# Patient Record
Sex: Female | Born: 1975 | Race: Black or African American | Hispanic: No | Marital: Married | Smoking: Never smoker
Health system: Southern US, Community
[De-identification: ages and names within clinical notes are randomized; demographics above are authoritative.]

## PROBLEM LIST (undated history)

## (undated) ENCOUNTER — Ambulatory Visit (HOSPITAL_COMMUNITY): Admission: EM | Payer: Commercial Managed Care - HMO | Source: Home / Self Care

## (undated) DIAGNOSIS — K760 Fatty (change of) liver, not elsewhere classified: Secondary | ICD-10-CM

## (undated) DIAGNOSIS — R7303 Prediabetes: Secondary | ICD-10-CM

## (undated) DIAGNOSIS — I10 Essential (primary) hypertension: Secondary | ICD-10-CM

## (undated) DIAGNOSIS — K589 Irritable bowel syndrome without diarrhea: Secondary | ICD-10-CM

## (undated) HISTORY — DX: Prediabetes: R73.03

## (undated) HISTORY — PX: CIRCUMCISION: SUR203

## (undated) HISTORY — DX: Essential (primary) hypertension: I10

## (undated) HISTORY — PX: HEMORRHOID SURGERY: SHX153

## (undated) HISTORY — DX: Fatty (change of) liver, not elsewhere classified: K76.0

---

## 2004-09-18 HISTORY — PX: DILATION AND CURETTAGE OF UTERUS: SHX78

## 2013-09-18 HISTORY — PX: HEMORRHOID SURGERY: SHX153

## 2014-08-28 ENCOUNTER — Ambulatory Visit: Payer: Self-pay | Attending: Internal Medicine

## 2014-09-17 ENCOUNTER — Emergency Department (HOSPITAL_COMMUNITY)
Admission: EM | Admit: 2014-09-17 | Discharge: 2014-09-17 | Disposition: A | Discharge status: Home / Self Care | Payer: Self-pay | Attending: Emergency Medicine | Admitting: Emergency Medicine

## 2014-09-17 ENCOUNTER — Encounter (HOSPITAL_COMMUNITY): Payer: Self-pay | Admitting: Family Medicine

## 2014-09-17 DIAGNOSIS — N939 Abnormal uterine and vaginal bleeding, unspecified: Secondary | ICD-10-CM | POA: Insufficient documentation

## 2014-09-17 DIAGNOSIS — Z3202 Encounter for pregnancy test, result negative: Secondary | ICD-10-CM | POA: Insufficient documentation

## 2014-09-17 LAB — POC URINE PREG, ED: PREG TEST UR: NEGATIVE

## 2014-09-17 NOTE — Discharge Instructions (Signed)
Abnormal Uterine Bleeding Abnormal uterine bleeding can affect women at various stages in life, including teenagers, women in their reproductive years, pregnant women, and women who have reached menopause. Several kinds of uterine bleeding are considered abnormal, including:  Bleeding or spotting between periods.   Bleeding after sexual intercourse.   Bleeding that is heavier or more than normal.   Periods that last longer than usual.  Bleeding after menopause.  Many cases of abnormal uterine bleeding are minor and simple to treat, while others are more serious. Any type of abnormal bleeding should be evaluated by your health care provider. Treatment will depend on the cause of the bleeding. HOME CARE INSTRUCTIONS Monitor your condition for any changes. The following actions may help to alleviate any discomfort you are experiencing:  Avoid the use of tampons and douches as directed by your health care provider.  Change your pads frequently. You should get regular pelvic exams and Pap tests. Keep all follow-up appointments for diagnostic tests as directed by your health care provider.  SEEK MEDICAL CARE IF:   Your bleeding lasts more than 1 week.   You feel dizzy at times.  SEEK IMMEDIATE MEDICAL CARE IF:   You pass out.   You are changing pads every 15 to 30 minutes.   You have abdominal pain.  You have a fever.   You become sweaty or weak.   You are passing large blood clots from the vagina.   You start to feel nauseous and vomit. MAKE SURE YOU:   Understand these instructions.  Will watch your condition.  Will get help right away if you are not doing well or get worse. Document Released: 09/04/2005 Document Revised: 09/09/2013 Document Reviewed: 04/03/2013 ExitCare Patient Information 2015 [employer]. This information is not intended to replace advice given to you by your health care provider. Make sure you discuss any questions you have with your  health care provider.  

## 2014-09-17 NOTE — ED Provider Notes (Signed)
CSN: [id number]     Arrival date & time 09/17/14  1101 History   First MD Initiated Contact with Patient 09/17/14 1136     Chief Complaint  Patient presents with  . Menstrual Problem     The history is provided by the patient.   Arabic interpreter used  Patient presents to emergency department complaining of irregular menstrual cycles for several years.  Her last menstrual cycle was in May.  She was under the care of a specialist in [place].  She is to be on a medication which she cannot remember the name.  She has [place] seen a gynecologist [place].  She presents complaining of abnormal menstrual cycles.  No active bleeding now.  She denies abdominal pain.  Denies nausea vomiting.  No fevers or chills.  No vaginal complaints.  History reviewed. No pertinent past medical history. No past surgical history on file. History reviewed. No pertinent family history. History  Substance Use Topics  . Smoking status: Never Smoker   . Smokeless tobacco: Not on file  . Alcohol Use: No   OB History    No data available     Review of Systems  All other systems reviewed and are negative.     Allergies  Review of patient's allergies indicates no known allergies.  Home Medications   Prior to Admission medications   Not on File   BP 134/83 mmHg  Pulse 100  Temp(Src) 98.6 F (37 C) (Oral)  Resp 17  SpO2 100% Physical Exam  Constitutional: She is oriented to person, place, and time. She appears well-developed and well-nourished. No distress.  HENT:  Head: Normocephalic and atraumatic.  Eyes: EOM are normal.  Neck: Normal range of motion.  Cardiovascular: Normal rate, regular rhythm and normal heart sounds.   Pulmonary/Chest: Effort normal and breath sounds normal.  Abdominal: Soft. She exhibits no distension. There is no tenderness. There is no rebound and no guarding.  Musculoskeletal: Normal range of motion.  Neurological: She is alert and oriented to person, place,  and time.  Skin: Skin is warm and dry.  Psychiatric: She has a normal mood and affect. Judgment normal.  Nursing note and vitals reviewed.   ED Course  Procedures (including critical care time) Labs Review Labs Reviewed  POC URINE PREG, ED    Imaging Review No results found.   EKG Interpretation None      MDM   Final diagnoses:  None    No indication for additional workup in emergency department.  Urine pregnancy test is negative.  She will need outpatient testing by gynecologist.  Referral to women's outpatient clinic.    Lyanne CoKevin M Jerman Tinnon, MD 09/17/14 [phone]

## 2014-09-17 NOTE — ED Notes (Signed)
Pt reports irregular period.  Pt takes "diveston" and states "if i dont take it, my period wont come."  Pt reports last period in may.  Pt reports pain in rlq and llq.

## 2014-09-17 NOTE — ED Notes (Signed)
Pt made aware to return if symptoms worsen or if any life threatening symptoms occur.   

## 2014-09-17 NOTE — ED Notes (Signed)
Pt here for irregular menstrual cycle. Ss since she was [age]. sts LMP last May. sts she takes meds for this but doesn't have

## 2014-10-01 ENCOUNTER — Encounter: Payer: Self-pay | Admitting: Family Medicine

## 2014-10-01 ENCOUNTER — Ambulatory Visit: Payer: Self-pay | Attending: Family Medicine | Admitting: Family Medicine

## 2014-10-01 VITALS — BP 105/70 | HR 103 | Temp 98.9°F | Resp 16 | Ht 61.0 in | Wt 134.6 lb

## 2014-10-01 DIAGNOSIS — Z113 Encounter for screening for infections with a predominantly sexual mode of transmission: Secondary | ICD-10-CM | POA: Insufficient documentation

## 2014-10-01 DIAGNOSIS — H0014 Chalazion left upper eyelid: Secondary | ICD-10-CM | POA: Insufficient documentation

## 2014-10-01 DIAGNOSIS — R51 Headache: Secondary | ICD-10-CM | POA: Insufficient documentation

## 2014-10-01 DIAGNOSIS — Z124 Encounter for screening for malignant neoplasm of cervix: Secondary | ICD-10-CM | POA: Insufficient documentation

## 2014-10-01 DIAGNOSIS — Z0000 Encounter for general adult medical examination without abnormal findings: Secondary | ICD-10-CM

## 2014-10-01 DIAGNOSIS — N926 Irregular menstruation, unspecified: Secondary | ICD-10-CM | POA: Insufficient documentation

## 2014-10-01 DIAGNOSIS — J309 Allergic rhinitis, unspecified: Secondary | ICD-10-CM | POA: Insufficient documentation

## 2014-10-01 DIAGNOSIS — Z23 Encounter for immunization: Secondary | ICD-10-CM

## 2014-10-01 DIAGNOSIS — N90810 Female genital mutilation status, unspecified: Secondary | ICD-10-CM

## 2014-10-01 LAB — POCT URINE PREGNANCY: PREG TEST UR: NEGATIVE

## 2014-10-01 LAB — COMPLETE METABOLIC PANEL WITH GFR
ALBUMIN: 3.5 g/dL (ref 3.5–5.2)
ALT: 13 U/L (ref 0–35)
AST: 16 U/L (ref 0–37)
Alkaline Phosphatase: 115 U/L (ref 39–117)
BUN: 11 mg/dL (ref 6–23)
CALCIUM: 9.5 mg/dL (ref 8.4–10.5)
CO2: 27 mEq/L (ref 19–32)
Chloride: 98 mEq/L (ref 96–112)
Creat: 0.56 mg/dL (ref 0.50–1.10)
GFR, Est Non African American: >89 mL/min
GLUCOSE: 108 mg/dL — AB (ref 70–99)
Potassium: 3.8 mEq/L (ref 3.5–5.3)
SODIUM: 135 meq/L (ref 135–145)
TOTAL PROTEIN: 7.1 g/dL (ref 6.0–8.3)
Total Bilirubin: 0.2 mg/dL (ref 0.2–1.2)

## 2014-10-01 LAB — POCT URINALYSIS DIPSTICK
BILIRUBIN UA: NEGATIVE
GLUCOSE UA: NEGATIVE
Ketones, UA: NEGATIVE
Leukocytes, UA: NEGATIVE
Nitrite, UA: NEGATIVE
PH UA: 5.5
Protein, UA: NEGATIVE
Spec Grav, UA: 1.025
UROBILINOGEN UA: 0.2

## 2014-10-01 LAB — TSH: TSH: 0.701 u[IU]/mL (ref 0.350–4.500)

## 2014-10-01 LAB — CBC
HCT: 38.8 % (ref 36.0–46.0)
Hemoglobin: 12 g/dL (ref 12.0–15.0)
MCH: 23.1 pg — ABNORMAL LOW (ref 26.0–34.0)
MCHC: 30.9 g/dL (ref 30.0–36.0)
MCV: 74.8 fL — ABNORMAL LOW (ref 78.0–100.0)
MPV: 9.8 fL (ref 8.6–12.4)
Platelets: 395 10*3/uL (ref 150–400)
RBC: 5.19 MIL/uL — AB (ref 3.87–5.11)
RDW: 15.7 % — ABNORMAL HIGH (ref 11.5–15.5)
WBC: 11 10*3/uL — ABNORMAL HIGH (ref 4.0–10.5)

## 2014-10-01 MED ORDER — LORATADINE 10 MG PO TABS: 10.0000 mg | ORAL_TABLET | Freq: Every day | ORAL | Status: DC

## 2014-10-01 MED ORDER — FLUTICASONE PROPIONATE 50 MCG/ACT NA SUSP: 2.0000 | Freq: Every day | NASAL | Status: DC

## 2014-10-01 NOTE — Progress Notes (Signed)
Here to establish care Has lower back pain been going on for years 10/10 Has pain in ears and throat itching in eyes and nose Pap not had ever Flu shot given

## 2014-10-01 NOTE — Patient Instructions (Addendum)
[name],  Thank you for coming in today. It was a pleasure meeting you. I look forward to being your primary doctor.   1. Irregular menses: labs done today. Normal pelvic exam. Plan for ultrasound after labs reviewed.   2. Allergies: claritin once daily. Flonase.  3. Chalazion on L upper eye lid A chalazion is a swelling or hard lump on the eyelid caused by a blocked oil gland. Chalazions may occur on the upper or the lower eyelid.  CAUSES  Oil gland in the eyelid becomes blocked. SYMPTOMS   Swelling or hard lump on the eyelid. This lump may make it hard to see out of the eye.  The swelling may spread to areas around the eye. TREATMENT   Although some chalazions disappear by themselves in 1 or 2 months, some chalazions may need to be removed.  Medicines to treat an infection may be required. HOME CARE INSTRUCTIONS   Wash your hands often and dry them with a clean towel. Do not touch the chalazion.  Apply heat to the eyelid several times a day for 10 minutes to help ease discomfort and bring any yellowish white fluid (pus) to the surface. One way to apply heat to a chalazion is to use the handle of a metal spoon.  Hold the handle under hot water until it is hot, and then wrap the handle in paper towels so that the heat can come through without burning your skin.  Hold the wrapped handle against the chalazion and reheat the spoon handle as needed.  Apply heat in this fashion for 10 minutes, 4 times per day.  Return to your caregiver to have the pus removed if it does not break (rupture) on its own.  Do not try to remove the pus yourself by squeezing the chalazion or sticking it with a pin or needle.  Only take over-the-counter or prescription medicines for pain, discomfort, or fever as directed by your caregiver. SEEK IMMEDIATE MEDICAL CARE IF:   You have pain in your eye.  Your vision changes.  The chalazion does not go away.  The chalazion becomes painful, red,  or swollen, grows larger, or does not start to disappear after 2 weeks. MAKE SURE YOU:   Understand these instructions.  Will watch your condition.  Will get help right away if you are not doing well or get worse. Document Released: 09/01/2000 Document Revised: 11/27/2011 Document Reviewed: 12/20/2009 [facility] Patient Information 2015 [place], [place]. This information is not intended to replace advice given to you by your health care provider. Make sure you discuss any questions you have with your health care provider.   F/u in 6 weeks for irregular menses and allergies   Dr. Armen PickupFunches

## 2014-10-01 NOTE — Progress Notes (Signed)
   Subjective:    Patient ID: Candice [name], female    DOB: 11-Mar-1976, 39 y.o.   MRN: [id number] CC: establish care, itchy eyes and nose, pain in head and throat, lower back pain, irregular periods, peeling skin around her fingernails   HPI 39 yo F establish care: Speaks Arabic, interpreter present:   1. Itchy eyes and nose: x one year. No medication. Tearing, sneezing and coughing, ear pressure. No known allergies. Frontal headache. No maxillary pain. No fever.    2. Irregular periods: menarche age 39. Irregular since middle school (age 912-13).  Has gone a whole year without seeing her period. She got married at age 39 and was unable to conceive. She had a D&C of her uterus in [place] in hopes of getting pregnant. Still did not conceived. She got divorced after one year of marriage. She was taking some medication for 3 months that induced menses, she stopped the medication in May 2015. LMP: May 2015.   Soc Hx: non smoker  Med Hx: irregular menses since age 39-13  Surg Hx: hemorrhoid surgery Review of Systems As per HPI     Objective:   Physical Exam BP 105/70 mmHg  Pulse 103  Temp(Src) 98.9 F (37.2 C)  Resp 16  Ht 5\' 1"  (1.549 m)  Wt 134 lb 9.6 oz (61.054 kg)  BMI 25.45 kg/m2  SpO2 100%  LMP 01/16/2014 (Approximate) General appearance: alert, cooperative and no distress Eyes: negative findings: conjunctivae and sclerae normal, corneas clear and pupils equal, round, reactive to light and accomodation, positive findings: eyelids/periorbital: chalazion on the left upper lid, non tender  Nose: swollen nasal turbinates  Neck: no adenopathy, supple, symmetrical, trachea midline and thyroid not enlarged, symmetric, no tenderness/mass/nodules Lungs: clear to auscultation bilaterally Heart: regular rate and rhythm, S1, S2 normal, no murmur, click, rub or gallop Abdomen: soft, non-tender; bowel sounds normal; no masses,  no organomegaly Pelvic: external genitalia reveals a well healed  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 Speculum exam done with small plastic speculum reveals normal vagina and cervix. Bimanual exam is negative for uterine or adnexal mass or tenderness.  Extremities: extremities normal, atraumatic, no cyanosis or edema       Assessment & Plan:

## 2014-10-02 ENCOUNTER — Telehealth: Payer: Self-pay | Admitting: *Deleted

## 2014-10-02 LAB — CERVICOVAGINAL ANCILLARY ONLY
Chlamydia: NEGATIVE
Neisseria Gonorrhea: NEGATIVE
WET PREP (BD AFFIRM): NEGATIVE
Wet Prep (BD Affirm): NEGATIVE
Wet Prep (BD Affirm): NEGATIVE

## 2014-10-02 LAB — CYTOLOGY - PAP

## 2014-10-02 LAB — HIV ANTIBODY (ROUTINE TESTING W REFLEX): HIV 1&2 Ab, 4th Generation: NONREACTIVE

## 2014-10-02 LAB — FSH/LH
FSH: 30 m[IU]/mL
LH: 29.9 m[IU]/mL

## 2014-10-02 NOTE — Assessment & Plan Note (Signed)
A: based on exam and history P: claritin once daily. Flonase.

## 2014-10-02 NOTE — Assessment & Plan Note (Signed)
Screening HIV negative  

## 2014-10-02 NOTE — Telephone Encounter (Signed)
-----   Message from Lora PaulaJosalyn C Funches, MD sent at 10/02/2014  8:21 AM EST ----- Normal labs with midcycle range hormone levels, normal TSH. HIV negative Normal CMP Normal WBC and Hgb, noted low MCV, ? Iron deficiency w/o anemia plan for f/u iron studies

## 2014-10-02 NOTE — Assessment & Plan Note (Signed)
A: U preg negative. Normal exam except for female circumcision as noted in chart.  P: Labs, reviewed and normal TSH, midcycle range FSH and LH, normal CMP and CBC.  F/u pelvic and transvaginal ultrasound ordered

## 2014-10-02 NOTE — Telephone Encounter (Signed)
Used [employer] Interpreted line Arabic # [id number]  Pt aware of lab results

## 2014-10-02 NOTE — Assessment & Plan Note (Signed)
Pap done

## 2014-10-02 NOTE — Assessment & Plan Note (Signed)
Chalazion on L upper eye lid. Recommend warm compresses, 4 x daily.

## 2014-10-02 NOTE — Telephone Encounter (Signed)
-----   Message from Lora PaulaJosalyn C Funches, MD sent at 10/02/2014  8:31 AM EST ----- Negative wet prep

## 2014-10-05 ENCOUNTER — Telehealth: Payer: Self-pay | Admitting: *Deleted

## 2014-10-05 NOTE — Telephone Encounter (Signed)
Used [employer] Interpreter Arabic [phone] Pt aware of results

## 2014-10-05 NOTE — Telephone Encounter (Signed)
-----   Message from Lora PaulaJosalyn C Funches, MD sent at 10/05/2014  1:46 PM EST ----- Negative GC/chlam Normal pap repeat in 3 years

## 2014-10-22 ENCOUNTER — Ambulatory Visit (INDEPENDENT_AMBULATORY_CARE_PROVIDER_SITE_OTHER): Payer: Self-pay | Admitting: Obstetrics and Gynecology

## 2014-10-22 VITALS — BP 120/57 | HR 104 | Wt 216.7 lb

## 2014-10-22 DIAGNOSIS — N911 Secondary amenorrhea: Secondary | ICD-10-CM

## 2014-10-22 MED ORDER — NORGESTIMATE-ETH ESTRADIOL 0.25-35 MG-MCG PO TABS: 1.0000 | ORAL_TABLET | Freq: Every day | ORAL | Status: DC

## 2014-10-22 NOTE — Progress Notes (Signed)
Patient ID: Candice SiasHala [name], female   DOB: [birth year], 39 y.o.   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 She reports a long standing history of irregular periods. She states that menarches occurred at the age of 39 and her irregular cycles started around the age of 39. The longest she has been amenorrheic was for 1 year. She had a D&C done in [place] in the past without improvement in her cycles. She was started on a medication similar to provera which induced cycles for 3 months but has since stopped taking it. Her last period was in 01/2014. [facility]  No past medical history on file. Past Surgical History  Procedure Laterality Date  . Dilation and curettage of uterus  2006  . Hemorrhoid surgery  2015  . 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 Hypertension Father    History  Substance Use Topics  . Smoking status: Never Smoker   . Smokeless tobacco: Not on file  . Alcohol Use: No   GENERAL: Well-developed, well-nourished female in no acute distress. Obese ABDOMEN: Soft, nontender, nondistended. No organomegaly. PELVIC: Normal external female genitalia. Vagina is pink and rugated.  Normal discharge. Normal appearing cervix. Uterus is normal in size. No adnexal mass or tenderness. EXTREMITIES: No cyanosis, clubbing, or edema, 2+ distal pulses.  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

## 2014-10-23 LAB — PROLACTIN: PROLACTIN: 3.8 ng/mL

## 2014-10-29 ENCOUNTER — Ambulatory Visit: Payer: Self-pay | Admitting: Family Medicine

## 2014-12-04 ENCOUNTER — Emergency Department (HOSPITAL_COMMUNITY): Payer: Self-pay

## 2014-12-04 ENCOUNTER — Encounter (HOSPITAL_COMMUNITY): Payer: Self-pay | Admitting: *Deleted

## 2014-12-04 ENCOUNTER — Emergency Department (HOSPITAL_COMMUNITY)
Admission: EM | Admit: 2014-12-04 | Discharge: 2014-12-04 | Disposition: A | Discharge status: Home / Self Care | Payer: Self-pay | Attending: Emergency Medicine | Admitting: Emergency Medicine

## 2014-12-04 DIAGNOSIS — Y998 Other external cause status: Secondary | ICD-10-CM | POA: Insufficient documentation

## 2014-12-04 DIAGNOSIS — Y9389 Activity, other specified: Secondary | ICD-10-CM | POA: Insufficient documentation

## 2014-12-04 DIAGNOSIS — Y9289 Other specified places as the place of occurrence of the external cause: Secondary | ICD-10-CM | POA: Insufficient documentation

## 2014-12-04 DIAGNOSIS — W19XXXA Unspecified fall, initial encounter: Secondary | ICD-10-CM

## 2014-12-04 DIAGNOSIS — Z7952 Long term (current) use of systemic steroids: Secondary | ICD-10-CM | POA: Insufficient documentation

## 2014-12-04 DIAGNOSIS — W1839XA Other fall on same level, initial encounter: Secondary | ICD-10-CM | POA: Insufficient documentation

## 2014-12-04 DIAGNOSIS — S39012A Strain of muscle, fascia and tendon of lower back, initial encounter: Secondary | ICD-10-CM | POA: Insufficient documentation

## 2014-12-04 DIAGNOSIS — Z79899 Other long term (current) drug therapy: Secondary | ICD-10-CM | POA: Insufficient documentation

## 2014-12-04 MED ORDER — IBUPROFEN 400 MG PO TABS
800.0000 mg | ORAL_TABLET | Freq: Once | ORAL | Status: AC
  Administered 2014-12-04: 800 mg via ORAL
  Filled 2014-12-04: qty 2

## 2014-12-04 MED ORDER — IBUPROFEN 800 MG PO TABS: 800.0000 mg | ORAL_TABLET | Freq: Three times a day (TID) | ORAL | Status: DC

## 2014-12-04 MED ORDER — CYCLOBENZAPRINE HCL 5 MG PO TABS: 5.0000 mg | ORAL_TABLET | Freq: Two times a day (BID) | ORAL | Status: DC | PRN

## 2014-12-04 NOTE — Discharge Instructions (Signed)

## 2014-12-04 NOTE — ED Notes (Signed)
Assessment with NP and RN with help of arabic translator

## 2014-12-04 NOTE — ED Notes (Signed)
The pt fell in the floor yesterday and she has had back pain since.  No previous problems  With her back lmp  Irregular not sure when last one was

## 2014-12-04 NOTE — ED Provider Notes (Signed)
CSN: [id number]     Arrival date & time 12/04/14  1459 History  This chart was scribed for non-physician practitioner, Teressa Lower, NP, working with Blane Ohara, MD by Charline Bills, ED Scribe. This patient was seen in room TR08C/TR08C and the patient's care was started at 3:40 PM.   Chief Complaint  Patient presents with  . Back Pain   The history is provided by the patient. The history is limited by a language barrier. A language interpreter was used.  HPI Comments: Candice [name] is a 39 y.o. female who presents to the Emergency Department complaining of a fall that occurred yesterday. Pt stepped on a stool yesterday to reach something on a top shelf when she fell backwards. Pt reports associated 10/10 lower back pain and 10/10 L leg pain. She denies numbness/tingling, h/o back pain. No medications tried PTA. No known allergies. LNMP is unknown; pt is irregular.   History reviewed. No pertinent past medical history. Past Surgical History  Procedure Laterality Date  . Dilation and curettage of uterus  2006  . Hemorrhoid surgery  2015  . 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 Hypertension Father    History  Substance Use Topics  . Smoking status: Never Smoker   . Smokeless tobacco: Not on file  . Alcohol Use: No   OB History    No data available     Review of Systems  Musculoskeletal: Positive for myalgias and back pain.  Neurological: Negative for numbness.   Allergies  Review of patient's allergies indicates no known allergies.  Home Medications   Prior to Admission medications   Medication Sig Start Date End Date Taking? Authorizing Provider  DHEA 50 MG CAPS Take 1 capsule by mouth.    Historical Provider, MD  fluticasone (FLONASE) 50 MCG/ACT nasal spray Place 2 sprays into both nostrils daily. Patient not taking: Reported on 10/22/2014 10/01/14   Dessa Phi, MD  loratadine  (CLARITIN) 10 MG tablet Take 1 tablet (10 mg total) by mouth daily. Patient not taking: Reported on 10/22/2014 10/01/14   Dessa Phi, MD  norgestimate-ethinyl estradiol (ORTHO-CYCLEN,SPRINTEC,PREVIFEM) 0.25-35 MG-MCG tablet Take 1 tablet by mouth daily. 10/22/14   Peggy Constant, MD   BP 120/86 mmHg  Pulse 102  Temp(Src) 98.4 F (36.9 C)  Resp 20  SpO2 99% Physical Exam  Constitutional: She is oriented to person, place, and time. She appears well-developed and well-nourished. No distress.  HENT:  Head: Normocephalic and atraumatic.  Eyes: Conjunctivae and EOM are normal.  Neck: Neck supple. No tracheal deviation present.  Cardiovascular: Normal rate.   Pulmonary/Chest: Effort normal. No respiratory distress.  Musculoskeletal: Normal range of motion.  Lumbar spine and paraspinal tenderness. Moving all extremities. No deformities or swelling to L knee.   Neurological: She is alert and oriented to person, place, and time.  Skin: Skin is warm and dry.  Psychiatric: She has a normal mood and affect. Her behavior is normal.  Nursing note and vitals reviewed.  ED Course  Procedures (including critical care time) DIAGNOSTIC STUDIES: Oxygen Saturation is 99% on RA, normal by my interpretation.    COORDINATION OF CARE: 3:47 PM-Discussed treatment plan which includes XR and ibuprofen with pt at bedside and pt agreed to plan.   Labs Review Labs Reviewed - No data to display  Imaging Review Dg Lumbar Spine Complete  12/04/2014   CLINICAL DATA:  Back pain. Pt states she stepped on a stool to help her reach something in her kitchen yesterday and fell backwards. Since the fall she has been having severe pain in her left leg and lower back. Tenderness to left calf and tenderness on palpation to lower back  EXAM: LUMBAR SPINE - COMPLETE 4+ VIEW  COMPARISON:  None.  FINDINGS: No fracture.  No spondylolisthesis.  There is mild straightening of the normal lumbar lordosis. There is moderate loss of  disc height at L4-L5 and L5-S1. Remaining lumbar disc spaces are well preserved. Facet joints are relatively well preserved.  Unremarkable soft tissues.  IMPRESSION: 1. No fracture or acute finding. 2. 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   Electronically Signed   By: Amie Portlandavid  Ormond M.D.   On: 12/04/2014 16:31     EKG Interpretation None      MDM   Final diagnoses:  Lumbar strain, initial encounter  Fall, initial encounter    No acute bony abnormality noted. Pt is neurologically intact. Will treat with ibuprofen and flexeril for pain  I personally performed the services described in this documentation, which was scribed in my presence. The recorded information has been reviewed and is accurate.  Teressa LowerVrinda Laird Runnion, NP 12/04/14 1640  Blane OharaJoshua Zavitz, MD 12/05/14 [phone]

## 2014-12-04 NOTE — ED Notes (Addendum)
Pt sts she stepped on a stool to help her reach something in her kitchen yesterday and fell backwards.  Since the fall she has been having severe pain in her left leg and lower back.    Tenderness to left calf and tenderness on palpation to lower back.

## 2015-01-22 ENCOUNTER — Ambulatory Visit: Payer: Self-pay | Attending: Family Medicine

## 2015-01-29 ENCOUNTER — Encounter: Payer: Self-pay | Admitting: Family Medicine

## 2015-01-29 ENCOUNTER — Ambulatory Visit: Payer: Self-pay | Attending: Family Medicine | Admitting: Family Medicine

## 2015-01-29 ENCOUNTER — Ambulatory Visit: Payer: Self-pay | Admitting: Family Medicine

## 2015-01-29 VITALS — BP 127/88 | HR 97 | Temp 98.3°F | Resp 16 | Ht 61.0 in | Wt 210.0 lb

## 2015-01-29 DIAGNOSIS — M545 Low back pain: Secondary | ICD-10-CM

## 2015-01-29 DIAGNOSIS — M47816 Spondylosis without myelopathy or radiculopathy, lumbar region: Secondary | ICD-10-CM | POA: Insufficient documentation

## 2015-01-29 DIAGNOSIS — G8929 Other chronic pain: Secondary | ICD-10-CM | POA: Insufficient documentation

## 2015-01-29 DIAGNOSIS — M2550 Pain in unspecified joint: Secondary | ICD-10-CM

## 2015-01-29 DIAGNOSIS — M7918 Myalgia, other site: Secondary | ICD-10-CM

## 2015-01-29 MED ORDER — METHYLPREDNISOLONE 4 MG PO TBPK: ORAL_TABLET | ORAL | Status: DC

## 2015-01-29 MED ORDER — CYCLOBENZAPRINE HCL 10 MG PO TABS: 10.0000 mg | ORAL_TABLET | Freq: Three times a day (TID) | ORAL | Status: DC | PRN

## 2015-01-29 MED ORDER — DICLOFENAC SODIUM 75 MG PO TBEC: 75.0000 mg | DELAYED_RELEASE_TABLET | Freq: Two times a day (BID) | ORAL | Status: DC

## 2015-01-29 NOTE — Progress Notes (Signed)
   Subjective:    Patient ID: Candice [name], female    DOB: 1975/10/30, 39 y.o.   MRN: [id number] CC: f/u back and  L leg pain x 2 months following fall on bottom from stool  HPI  1. Back and L calf pain: following fall from stool 2 months ago. Flexeril and diclofenac do not help. Pain is worse with ambulation. No incontinence. No weakness. Patient requesting MRI.  2. Arthralgias: diffuse arthralgias for at least 4 years. No joint swelling or deformity. Patient was still living in [place] when pains started.   Soc Hx: non smoker  Review of Systems  Musculoskeletal: Positive for myalgias, back pain and arthralgias. Negative for joint swelling, gait problem, neck pain and neck stiffness.       Objective:   Physical Exam BP 127/88 mmHg  Pulse 97  Temp(Src) 98.3 F (36.8 C) (Oral)  Resp 16  Ht 5\' 1"  (1.549 m)  Wt 210 lb (95.255 kg)  BMI 39.70 kg/m2  SpO2 98%  Wt Readings from Last 3 Encounters:  01/29/15 210 lb (95.255 kg)  12/04/14 215 lb (97.523 kg)  10/22/14 216 lb 11.2 oz (98.294 kg)  General appearance: alert, cooperative and no distress, moderately obese   Back Exam: Back: Normal Curvature, no deformities or CVA tenderness  Paraspinal Tenderness: b/l lumbar   LE Strength 5/5  LE Sensation: in tact  LE Reflexes 2+ and symmetric  Straight leg raise: negative         Assessment & Plan:

## 2015-01-29 NOTE — Patient Instructions (Signed)
[name],  Thank you for coming in today  1. Back pain x 2 months MRI ordered Flexeril refilled-muscle relaxer Diclofenac refilled-antiinflammatory Short course of steroid do not take with diclofenac    F/u with me in 2 month for f/u back pain and joint pains   Dr. Armen PickupFunches

## 2015-01-29 NOTE — Progress Notes (Signed)
Back pain and lt leg pain due to injury x 2 month ago  Medicine refills  Taking Diclofenac from her Country    Used Language resource Arabic Clem[employer] CatholicSarra Gabralla

## 2015-01-29 NOTE — Assessment & Plan Note (Signed)
Back pain x 2 months, myalgias more likely than sciatica based on negative exam.   MRI ordered Flexeril refilled-muscle relaxer Diclofenac refilled-antiinflammatory Short course of steroid do not take with diclofenac    F/u with me in 2 month for f/u back pain and joint pains

## 2015-02-12 ENCOUNTER — Ambulatory Visit (INDEPENDENT_AMBULATORY_CARE_PROVIDER_SITE_OTHER): Payer: Self-pay | Admitting: Obstetrics and Gynecology

## 2015-02-12 ENCOUNTER — Encounter: Payer: Self-pay | Admitting: Obstetrics and Gynecology

## 2015-02-12 VITALS — BP 130/73 | HR 101 | Temp 98.2°F | Ht 59.0 in | Wt 206.6 lb

## 2015-02-12 DIAGNOSIS — N760 Acute vaginitis: Secondary | ICD-10-CM

## 2015-02-12 NOTE — Progress Notes (Signed)
[employer] used for arabic interpreter

## 2015-02-12 NOTE — Progress Notes (Signed)
Patient ID: Candice SiasHala [name], female   DOB: 1976-01-20, 39 y.o.   MRN: [id number] 39 yo G0 with LMP 01/01/2015 presenting today seeking the evaluation of vaginal discharge with odor and pruritis which has been present for the past week. Patient reports taking OCP prescription daily and reports having a period over the past 2 months. She is getting married next month and would like to conceive.  History reviewed. No pertinent past medical history. Past Surgical History  Procedure Laterality Date  . Dilation and curettage of uterus  2006  . Hemorrhoid surgery  2015  . 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 Hypertension Father    History  Substance Use Topics  . Smoking status: Never Smoker   . Smokeless tobacco: Not on file  . Alcohol Use: No   ROS See pertinent in HPI  GENERAL: Well-developed, well-nourished female in no acute distress. Obese PELVIC: Normal external female genitalia with female circumcision. Vagina is pink and rugated.  Normal discharge. Normal appearing cervix. Uterus is normal in size. No adnexal mass or tenderness. EXTREMITIES: No cyanosis, clubbing, or edema, 2+ distal pulses.  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 Instructions on determining ovulation day based on menstrual cycle explained and patient verbalized understanding. If no menses occurs, patient advised to rely on ovulation predictor kits. If no conception after 6 months will need to see a reproductive specialist. Contact info for Dr. April MansonYalcinkaya provided

## 2015-02-13 LAB — WET PREP, GENITAL
Clue Cells Wet Prep HPF POC: NONE SEEN
Trich, Wet Prep: NONE SEEN
Yeast Wet Prep HPF POC: NONE SEEN

## 2015-02-16 ENCOUNTER — Ambulatory Visit (HOSPITAL_COMMUNITY)
Admission: RE | Admit: 2015-02-16 | Discharge: 2015-02-16 | Disposition: A | Discharge status: Home / Self Care | Payer: Self-pay | Source: Ambulatory Visit | Attending: Family Medicine | Admitting: Family Medicine

## 2015-02-16 DIAGNOSIS — M7918 Myalgia, other site: Secondary | ICD-10-CM

## 2015-02-16 DIAGNOSIS — M4806 Spinal stenosis, lumbar region: Secondary | ICD-10-CM | POA: Insufficient documentation

## 2015-02-16 DIAGNOSIS — M5137 Other intervertebral disc degeneration, lumbosacral region: Secondary | ICD-10-CM | POA: Insufficient documentation

## 2015-02-16 DIAGNOSIS — M5126 Other intervertebral disc displacement, lumbar region: Secondary | ICD-10-CM | POA: Insufficient documentation

## 2015-02-16 DIAGNOSIS — M4807 Spinal stenosis, lumbosacral region: Secondary | ICD-10-CM | POA: Insufficient documentation

## 2015-02-16 DIAGNOSIS — M5136 Other intervertebral disc degeneration, lumbar region: Secondary | ICD-10-CM | POA: Insufficient documentation

## 2015-02-16 DIAGNOSIS — E882 Lipomatosis, not elsewhere classified: Secondary | ICD-10-CM | POA: Insufficient documentation

## 2015-02-16 DIAGNOSIS — W19XXXA Unspecified fall, initial encounter: Secondary | ICD-10-CM | POA: Insufficient documentation

## 2015-02-16 DIAGNOSIS — M5127 Other intervertebral disc displacement, lumbosacral region: Secondary | ICD-10-CM | POA: Insufficient documentation

## 2015-02-19 ENCOUNTER — Encounter: Payer: Self-pay | Admitting: Family Medicine

## 2015-02-19 DIAGNOSIS — M4726 Other spondylosis with radiculopathy, lumbar region: Secondary | ICD-10-CM

## 2015-03-23 ENCOUNTER — Telehealth: Payer: Self-pay | Admitting: Family Medicine

## 2015-03-23 NOTE — Telephone Encounter (Signed)
Used [employer] Interpreted Arabic [phone] LVM to return call      MRI of low back revealed disc degeneration and small disc protrusion at L4-L5 and L5-S1 Continue current care plan

## 2015-03-23 NOTE — Telephone Encounter (Signed)
Patient is wanting to inquire abour her results for her MRI. Please follow up with pt. Pt speaks Arabic and only a little bit of [place]. Thank you.

## 2015-03-26 NOTE — Telephone Encounter (Signed)
Patient is returning phone call in regards to her MRI results, please f/u with pt.

## 2015-03-29 ENCOUNTER — Telehealth: Payer: Self-pay

## 2015-03-29 NOTE — Telephone Encounter (Signed)
LVM x2 to return call 

## 2015-03-29 NOTE — Telephone Encounter (Signed)
See previous note. Patient made appointment for 04/07/15.

## 2015-03-29 NOTE — Telephone Encounter (Signed)
Patient came into clinic to get MRI results. Patient verified date of birth. Nurse called [employer], reached interpreter [phone]. Patient aware of MRI of low back reveals disc degeneration and small disc protrusion at L4-L5 and L5-S1. Patient agrees to continue current care plan.  Patient agrees to see Dr. Armen [employer] for 2 month follow up on or after 03/31/15.  Patient voices understanding and has no further questions at this time.

## 2015-04-07 ENCOUNTER — Ambulatory Visit: Payer: Self-pay | Attending: Family Medicine | Admitting: Family Medicine

## 2015-04-07 ENCOUNTER — Encounter: Payer: Self-pay | Admitting: Family Medicine

## 2015-04-07 VITALS — BP 120/70 | HR 99 | Temp 98.4°F | Resp 16 | Ht 59.0 in | Wt 203.0 lb

## 2015-04-07 DIAGNOSIS — E559 Vitamin D deficiency, unspecified: Secondary | ICD-10-CM | POA: Insufficient documentation

## 2015-04-07 DIAGNOSIS — M7918 Myalgia, other site: Secondary | ICD-10-CM

## 2015-04-07 DIAGNOSIS — M4726 Other spondylosis with radiculopathy, lumbar region: Secondary | ICD-10-CM | POA: Insufficient documentation

## 2015-04-07 DIAGNOSIS — M545 Low back pain: Secondary | ICD-10-CM | POA: Insufficient documentation

## 2015-04-07 DIAGNOSIS — M5416 Radiculopathy, lumbar region: Secondary | ICD-10-CM

## 2015-04-07 DIAGNOSIS — M5417 Radiculopathy, lumbosacral region: Secondary | ICD-10-CM

## 2015-04-07 LAB — CBC
HCT: 39.2 % (ref 36.0–46.0)
Hemoglobin: 12.6 g/dL (ref 12.0–15.0)
MCH: 24.6 pg — AB (ref 26.0–34.0)
MCHC: 32.1 g/dL (ref 30.0–36.0)
MCV: 76.6 fL — ABNORMAL LOW (ref 78.0–100.0)
MPV: 10 fL (ref 8.6–12.4)
PLATELETS: 415 10*3/uL — AB (ref 150–400)
RBC: 5.12 MIL/uL — ABNORMAL HIGH (ref 3.87–5.11)
RDW: 15.7 % — ABNORMAL HIGH (ref 11.5–15.5)
WBC: 8.2 10*3/uL (ref 4.0–10.5)

## 2015-04-07 MED ORDER — CYCLOBENZAPRINE HCL 10 MG PO TABS: 10.0000 mg | ORAL_TABLET | Freq: Three times a day (TID) | ORAL | Status: DC | PRN

## 2015-04-07 MED ORDER — GABAPENTIN 300 MG PO CAPS: 300.0000 mg | ORAL_CAPSULE | Freq: Every day | ORAL | Status: DC

## 2015-04-07 MED ORDER — DICLOFENAC SODIUM 75 MG PO TBEC: 75.0000 mg | DELAYED_RELEASE_TABLET | Freq: Two times a day (BID) | ORAL | Status: DC

## 2015-04-07 NOTE — Assessment & Plan Note (Signed)
1. Low back pain with sciatica L side Checking labs: Vit D, BMP, CBC  Treatment: Diclofenac with food, antiinflammatory Flexeril as needed, muscle relaxer  Gabapentin 300 mg nightly  Referral to physical therapy  If the above does not improve pain complaint will refer to neurosurgery/pain management

## 2015-04-07 NOTE — Patient Instructions (Addendum)
Candice [name],  Thank you for coming in today.  1. Low back pain with sciatica L side Checking labs: Vit D, BMP, CBC  Treatment: Diclofenac with food, antiinflammatory Flexeril as needed, muscle relaxer  Gabapentin 300 mg nightly  Referral to physical therapy  F/u in 2 months for low back pain   Sciatica with Rehab The sciatic nerve runs from the back down the leg and is responsible for sensation and control of the muscles in the back (posterior) side of the thigh, lower leg, and foot. Sciatica is a condition that is characterized by inflammation of this nerve.  SYMPTOMS   Signs of nerve damage, including numbness and/or weakness along the posterior side of the lower extremity.  Pain in the back of the thigh that may also travel down the leg.  Pain that worsens when sitting for long periods of time.  Occasionally, pain in the back or buttock. CAUSES  Inflammation of the sciatic nerve is the cause of sciatica. The inflammation is due to something irritating the nerve. Common sources of irritation include:  Sitting for long periods of time.  Direct trauma to the nerve.  Arthritis of the spine.  Herniated or ruptured disk.  Slipping of the vertebrae (spondylolisthesis).  Pressure from soft tissues, such as muscles or ligament-like tissue (fascia). RISK INCREASES WITH:  Sports that place pressure or stress on the spine (football or weightlifting).  Poor strength and flexibility.  Failure to warm up properly before activity.  Family history of low back pain or disk disorders.  Previous back injury or surgery.  Poor body mechanics, especially when lifting, or poor posture. PREVENTION   Warm up and stretch properly before activity.  Maintain physical fitness:  Strength, flexibility, and endurance.  Cardiovascular fitness.  Learn and use proper technique, especially with posture and lifting. When possible, have coach correct improper technique.  Avoid  activities that place stress on the spine. PROGNOSIS If treated properly, then sciatica usually resolves within 6 weeks. However, occasionally surgery is necessary.  RELATED COMPLICATIONS   Permanent nerve damage, including pain, numbness, tingle, or weakness.  Chronic back pain.  Risks of surgery: infection, bleeding, nerve damage, or damage to surrounding tissues. TREATMENT Treatment initially involves resting from any activities that aggravate your symptoms. The use of ice and medication may help reduce pain and inflammation. The use of strengthening and stretching exercises may help reduce pain with activity. These exercises may be performed at home or with referral to a therapist. A therapist may recommend further treatments, such as transcutaneous electronic nerve stimulation (TENS) or ultrasound. Your caregiver may recommend corticosteroid injections to help reduce inflammation of the sciatic nerve. If symptoms persist despite non-surgical (conservative) treatment, then surgery may be recommended. MEDICATION  If pain medication is necessary, then nonsteroidal anti-inflammatory medications, such as aspirin and ibuprofen, or other minor pain relievers, such as acetaminophen, are often recommended.  Do not take pain medication for 7 days before surgery.  Prescription pain relievers may be given if deemed necessary by your caregiver. Use only as directed and only as much as you need.  Ointments applied to the skin may be helpful.  Corticosteroid injections may be given by your caregiver. These injections should be reserved for the most serious cases, because they may only be given a certain number of times. HEAT AND COLD  Cold treatment (icing) relieves pain and reduces inflammation. Cold treatment should be applied for 10 to 15 minutes every 2 to 3 hours for inflammation and pain and  immediately after any activity that aggravates your symptoms. Use ice packs or massage the area with a  piece of ice (ice massage).  Heat treatment may be used prior to performing the stretching and strengthening activities prescribed by your caregiver, physical therapist, or athletic trainer. Use a heat pack or soak the injury in warm water. SEEK MEDICAL CARE IF:  Treatment seems to offer no benefit, or the condition worsens.  Any medications produce adverse side effects. EXERCISES  RANGE OF MOTION (ROM) AND STRETCHING EXERCISES - Sciatica Most people with sciatic will find that their symptoms worsen with either excessive bending forward (flexion) or arching at the low back (extension). The exercises which will help resolve your symptoms will focus on the opposite motion. Your physician, physical therapist or athletic trainer will help you determine which exercises will be most helpful to resolve your low back pain. Do not complete any exercises without first consulting with your clinician. Discontinue any exercises which worsen your symptoms until you speak to your clinician. If you have pain, numbness or tingling which travels down into your buttocks, leg or foot, the goal of the therapy is for these symptoms to move closer to your back and eventually resolve. Occasionally, these leg symptoms will get better, but your low back pain may worsen; this is typically an indication of progress in your rehabilitation. Be certain to be very alert to any changes in your symptoms and the activities in which you participated in the 24 hours prior to the change. Sharing this information with your clinician will allow him/her to most efficiently treat your condition. These exercises may help you when beginning to rehabilitate your injury. Your symptoms may resolve with or without further involvement from your physician, physical therapist or athletic trainer. While completing these exercises, remember:   Restoring tissue flexibility helps normal motion to return to the joints. This allows healthier, less painful  movement and activity.  An effective stretch should be held for at least 30 seconds.  A stretch should never be painful. You should only feel a gentle lengthening or release in the stretched tissue. FLEXION RANGE OF MOTION AND STRETCHING EXERCISES: STRETCH - Flexion, Single Knee to Chest   Lie on a firm bed or floor with both legs extended in front of you.  Keeping one leg in contact with the floor, bring your opposite knee to your chest. Hold your leg in place by either grabbing behind your thigh or at your knee.  Pull until you feel a gentle stretch in your low back. Hold __________ seconds.  Slowly release your grasp and repeat the exercise with the opposite side. Repeat __________ times. Complete this exercise __________ times per day.  STRETCH - Flexion, Double Knee to Chest  Lie on a firm bed or floor with both legs extended in front of you.  Keeping one leg in contact with the floor, bring your opposite knee to your chest.  Tense your stomach muscles to support your back and then lift your other knee to your chest. Hold your legs in place by either grabbing behind your thighs or at your knees.  Pull both knees toward your chest until you feel a gentle stretch in your low back. Hold __________ seconds.  Tense your stomach muscles and slowly return one leg at a time to the floor. Repeat __________ times. Complete this exercise __________ times per day.  STRETCH - Low Trunk Rotation   Lie on a firm bed or floor. Keeping your legs in front  of you, bend your knees so they are both pointed toward the ceiling and your feet are flat on the floor.  Extend your arms out to the side. This will stabilize your upper body by keeping your shoulders in contact with the floor.  Gently and slowly drop both knees together to one side until you feel a gentle stretch in your low back. Hold for __________ seconds.  Tense your stomach muscles to support your low back as you bring your knees back  to the starting position. Repeat the exercise to the other side. Repeat __________ times. Complete this exercise __________ times per day  EXTENSION RANGE OF MOTION AND FLEXIBILITY EXERCISES: STRETCH - Extension, Prone on Elbows  Lie on your stomach on the floor, a bed will be too soft. Place your palms about shoulder width apart and at the height of your head.  Place your elbows under your shoulders. If this is too painful, stack pillows under your chest.  Allow your body to relax so that your hips drop lower and make contact more completely with the floor.  Hold this position for __________ seconds.  Slowly return to lying flat on the floor. Repeat __________ times. Complete this exercise __________ times per day.  RANGE OF MOTION - Extension, Prone Press Ups  Lie on your stomach on the floor, a bed will be too soft. Place your palms about shoulder width apart and at the height of your head.  Keeping your back as relaxed as possible, slowly straighten your elbows while keeping your hips on the floor. You may adjust the placement of your hands to maximize your comfort. As you gain motion, your hands will come more underneath your shoulders.  Hold this position __________ seconds.  Slowly return to lying flat on the floor. Repeat __________ times. Complete this exercise __________ times per day.  STRENGTHENING EXERCISES - Sciatica  These exercises may help you when beginning to rehabilitate your injury. These exercises should be done near your "sweet spot." This is the neutral, low-back arch, somewhere between fully rounded and fully arched, that is your least painful position. When performed in this safe range of motion, these exercises can be used for people who have either a flexion or extension based injury. These exercises may resolve your symptoms with or without further involvement from your physician, physical therapist or athletic trainer. While completing these exercises,  remember:   Muscles can gain both the endurance and the strength needed for everyday activities through controlled exercises.  Complete these exercises as instructed by your physician, physical therapist or athletic trainer. Progress with the resistance and repetition exercises only as your caregiver advises.  You may experience muscle soreness or fatigue, but the pain or discomfort you are trying to eliminate should never worsen during these exercises. If this pain does worsen, stop and make certain you are following the directions exactly. If the pain is still present after adjustments, discontinue the exercise until you can discuss the trouble with your clinician. STRENGTHENING - Deep Abdominals, Pelvic Tilt   Lie on a firm bed or floor. Keeping your legs in front of you, bend your knees so they are both pointed toward the ceiling and your feet are flat on the floor.  Tense your lower abdominal muscles to press your low back into the floor. This motion will rotate your pelvis so that your tail bone is scooping upwards rather than pointing at your feet or into the floor.  With a gentle tension and even  breathing, hold this position for __________ seconds. Repeat __________ times. Complete this exercise __________ times per day.  STRENGTHENING - Abdominals, Crunches   Lie on a firm bed or floor. Keeping your legs in front of you, bend your knees so they are both pointed toward the ceiling and your feet are flat on the floor. Cross your arms over your chest.  Slightly tip your chin down without bending your neck.  Tense your abdominals and slowly lift your trunk high enough to just clear your shoulder blades. Lifting higher can put excessive stress on the low back and does not further strengthen your abdominal muscles.  Control your return to the starting position. Repeat __________ times. Complete this exercise __________ times per day.  STRENGTHENING - Quadruped, Opposite UE/LE  Lift  Assume a hands and knees position on a firm surface. Keep your hands under your shoulders and your knees under your hips. You may place padding under your knees for comfort.  Find your neutral spine and gently tense your abdominal muscles so that you can maintain this position. Your shoulders and hips should form a rectangle that is parallel with the floor and is not twisted.  Keeping your trunk steady, lift your right hand no higher than your shoulder and then your left leg no higher than your hip. Make sure you are not holding your breath. Hold this position __________ seconds.  Continuing to keep your abdominal muscles tense and your back steady, slowly return to your starting position. Repeat with the opposite arm and leg. Repeat __________ times. Complete this exercise __________ times per day.  STRENGTHENING - Abdominals and Quadriceps, Straight Leg Raise   Lie on a firm bed or floor with both legs extended in front of you.  Keeping one leg in contact with the floor, bend the other knee so that your foot can rest flat on the floor.  Find your neutral spine, and tense your abdominal muscles to maintain your spinal position throughout the exercise.  Slowly lift your straight leg off the floor about 6 inches for a count of 15, making sure to not hold your breath.  Still keeping your neutral spine, slowly lower your leg all the way to the floor. Repeat this exercise with each leg __________ times. Complete this exercise __________ times per day. POSTURE AND BODY MECHANICS CONSIDERATIONS - Sciatica Keeping correct posture when sitting, standing or completing your activities will reduce the stress put on different body tissues, allowing injured tissues a chance to heal and limiting painful experiences. The following are general guidelines for improved posture. Your physician or physical therapist will provide you with any instructions specific to your needs. While reading these  guidelines, remember:  The exercises prescribed by your provider will help you have the flexibility and strength to maintain correct postures.  The correct posture provides the optimal environment for your joints to work. All of your joints have less wear and tear when properly supported by a spine with good posture. This means you will experience a healthier, less painful body.  Correct posture must be practiced with all of your activities, especially prolonged sitting and standing. Correct posture is as important when doing repetitive low-stress activities (typing) as it is when doing a single heavy-load activity (lifting). RESTING POSITIONS Consider which positions are most painful for you when choosing a resting position. If you have pain with flexion-based activities (sitting, bending, stooping, squatting), choose a position that allows you to rest in a less flexed posture. You would  want to avoid curling into a fetal position on your side. If your pain worsens with extension-based activities (prolonged standing, working overhead), avoid resting in an extended position such as sleeping on your stomach. Most people will find more comfort when they rest with their spine in a more neutral position, neither too rounded nor too arched. Lying on a non-sagging bed on your side with a pillow between your knees, or on your back with a pillow under your knees will often provide some relief. Keep in mind, being in any one position for a prolonged period of time, no matter how correct your posture, can still lead to stiffness. PROPER SITTING POSTURE In order to minimize stress and discomfort on your spine, you must sit with correct posture Sitting with good posture should be effortless for a healthy body. Returning to good posture is a gradual process. Many people can work toward this most comfortably by using various supports until they have the flexibility and strength to maintain this posture on their  own. When sitting with proper posture, your ears will fall over your shoulders and your shoulders will fall over your hips. You should use the back of the chair to support your upper back. Your low back will be in a neutral position, just slightly arched. You may place a small pillow or folded towel at the base of your low back for support.  When working at a desk, create an environment that supports good, upright posture. Without extra support, muscles fatigue and lead to excessive strain on joints and other tissues. Keep these recommendations in mind: CHAIR:   A chair should be able to slide under your desk when your back makes contact with the back of the chair. This allows you to work closely.  The chair's height should allow your eyes to be level with the upper part of your monitor and your hands to be slightly lower than your elbows. BODY POSITION  Your feet should make contact with the floor. If this is not possible, use a foot rest.  Keep your ears over your shoulders. This will reduce stress on your neck and low back. INCORRECT SITTING POSTURES   If you are feeling tired and unable to assume a healthy sitting posture, do not slouch or slump. This puts excessive strain on your back tissues, causing more damage and pain. Healthier options include:  Using more support, like a lumbar pillow.  Switching tasks to something that requires you to be upright or walking.  Talking a brief walk.  Lying down to rest in a neutral-spine position. PROLONGED STANDING WHILE SLIGHTLY LEANING FORWARD  When completing a task that requires you to lean forward while standing in one place for a long time, place either foot up on a stationary 2-4 inch high object to help maintain the best posture. When both feet are on the ground, the low back tends to lose its slight inward curve. If this curve flattens (or becomes too large), then the back and your other joints will experience too much stress, fatigue more  quickly and can cause pain.  CORRECT STANDING POSTURES Proper standing posture should be assumed with all daily activities, even if they only take a few moments, like when brushing your teeth. As in sitting, your ears should fall over your shoulders and your shoulders should fall over your hips. You should keep a slight tension in your abdominal muscles to brace your spine. Your tailbone should point down to the ground, not behind your  body, resulting in an over-extended swayback posture.  INCORRECT STANDING POSTURES  Common incorrect standing postures include a forward head, locked knees and/or an excessive swayback. WALKING Walk with an upright posture. Your ears, shoulders and hips should all line-up. PROLONGED ACTIVITY IN A FLEXED POSITION When completing a task that requires you to bend forward at your waist or lean over a low surface, try to find a way to stabilize 3 of 4 of your limbs. You can place a hand or elbow on your thigh or rest a knee on the surface you are reaching across. This will provide you more stability so that your muscles do not fatigue as quickly. By keeping your knees relaxed, or slightly bent, you will also reduce stress across your low back. CORRECT LIFTING TECHNIQUES DO :   Assume a wide stance. This will provide you more stability and the opportunity to get as close as possible to the object which you are lifting.  Tense your abdominals to brace your spine; then bend at the knees and hips. Keeping your back locked in a neutral-spine position, lift using your leg muscles. Lift with your legs, keeping your back straight.  Test the weight of unknown objects before attempting to lift them.  Try to keep your elbows locked down at your sides in order get the best strength from your shoulders when carrying an object.  Always ask for help when lifting heavy or awkward objects. INCORRECT LIFTING TECHNIQUES DO NOT:   Lock your knees when lifting, even if it is a small  object.  Bend and twist. Pivot at your feet or move your feet when needing to change directions.  Assume that you cannot safely pick up a paperclip without proper posture. Document Released: 09/04/2005 Document Revised: 01/19/2014 Document Reviewed: 12/17/2008 [facility] Patient Information 2015 [place], [place]. This information is not intended to replace advice given to you by your health care provider. Make sure you discuss any questions you have with your health care provider.

## 2015-04-07 NOTE — Progress Notes (Signed)
   Subjective:    Patient ID: Candice SiasHala Calise, female    DOB: [birth year], 39 y.o.   MRN: [id number] CC: f/u low back pain and review MRI  [employer] interpreter present  HPI 39 yo F with hx of chronic pain presents for f/u low back pain  1. Low back pain: started in 11/2014 following a fall from a stool. Patient had low back and L calf pain that did not improve with NSAID and muscle relaxer. She had MRI of low back done on 02/16/2015 that revealed:  1. 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 Mild left greater than right foraminal stenosis at each level. 2. Superimposed epidural lipomatosis  MRI reviewed with patient   Today she reports, pain in her low back that is worse on the L side. With pain in L leg when she sits. She has fecal and urinary urgency and no incontinence. She has L leg numbness. No leg weakness.   She is currently not taking medication   Soc Hx: non smoker  Review of Systems  Constitutional: Negative for fever, chills and unexpected weight change.  Musculoskeletal: Positive for back pain.  Neurological: Positive for headaches.      Objective:   Physical Exam BP 120/70 mmHg  Pulse 99  Temp(Src) 98.4 F (36.9 C) (Oral)  Resp 16  Ht 4\' 11"  (1.499 m)  Wt 203 lb (92.08 kg)  BMI 40.98 kg/m2  SpO2 98%  Wt Readings from Last 3 Encounters:  04/07/15 203 lb (92.08 kg)  02/12/15 206 lb 9.6 oz (93.713 kg)  01/29/15 210 lb (95.255 kg)  General appearance: alert, cooperative, no distress and moderately obese Back Exam: Back: Normal Curvature, no deformities or CVA tenderness  Paraspinal Tenderness: b/l lumbar tenderness   LE Strength 5/5  LE Sensation: in tact  LE Reflexes 2+ and symmetric  Straight leg raise: + on L side       Assessment & Plan:

## 2015-04-07 NOTE — Progress Notes (Signed)
Low back pain and lt leg pain due to injury   MRI results   Used [employer]

## 2015-04-08 DIAGNOSIS — E559 Vitamin D deficiency, unspecified: Secondary | ICD-10-CM | POA: Insufficient documentation

## 2015-04-08 LAB — BASIC METABOLIC PANEL
BUN: 12 mg/dL (ref 6–23)
CALCIUM: 9.7 mg/dL (ref 8.4–10.5)
CO2: 22 mEq/L (ref 19–32)
Chloride: 100 mEq/L (ref 96–112)
Creat: 0.59 mg/dL (ref 0.50–1.10)
GLUCOSE: 111 mg/dL — AB (ref 70–99)
Potassium: 4.1 mEq/L (ref 3.5–5.3)
Sodium: 138 mEq/L (ref 135–145)

## 2015-04-08 LAB — VITAMIN D 25 HYDROXY (VIT D DEFICIENCY, FRACTURES): Vit D, 25-Hydroxy: 16 ng/mL — ABNORMAL LOW (ref 30–100)

## 2015-04-08 MED ORDER — VITAMIN D (ERGOCALCIFEROL) 1.25 MG (50000 UNIT) PO CAPS: 50000.0000 [IU] | ORAL_CAPSULE | ORAL | Status: DC

## 2015-04-08 NOTE — Assessment & Plan Note (Signed)
A: vit D deficiency P: replaced vit D orally per orders  

## 2015-04-08 NOTE — Addendum Note (Signed)
Addended by: Dessa Phi on: 04/08/2015 09:25 AM   Modules accepted: Orders

## 2015-04-09 ENCOUNTER — Telehealth: Payer: Self-pay | Admitting: *Deleted

## 2015-04-09 NOTE — Telephone Encounter (Signed)
-----   Message from Dessa Phi, MD sent at 04/08/2015  9:24 AM EDT ----- Vit D deficiency, will replace Normal CBC and BMP

## 2015-04-09 NOTE — Telephone Encounter (Signed)
Used [employer] Arabic # [phone] Pt aware of results

## 2015-04-27 ENCOUNTER — Ambulatory Visit: Payer: Self-pay | Attending: Family Medicine

## 2015-04-27 ENCOUNTER — Ambulatory Visit: Payer: Self-pay | Attending: Family Medicine | Admitting: Physical Therapy

## 2015-04-27 DIAGNOSIS — M5417 Radiculopathy, lumbosacral region: Secondary | ICD-10-CM | POA: Insufficient documentation

## 2015-04-27 DIAGNOSIS — M5416 Radiculopathy, lumbar region: Secondary | ICD-10-CM

## 2015-04-27 DIAGNOSIS — M2560 Stiffness of unspecified joint, not elsewhere classified: Secondary | ICD-10-CM | POA: Insufficient documentation

## 2015-04-27 NOTE — Therapy (Signed)
[facility] [address] [place], [place], [address] Phone: [phone]   Fax:  [phone]  Physical Therapy Evaluation  Patient Details  Name: Candice [name] MRN: [id number] Date of Birth: [birth year] Referring Provider:  Dessa Phi, MD  Encounter Date: 04/27/2015      PT End of Session - 04/27/15 1622    Visit Number 1   Number of Visits 16   Date for PT Re-Evaluation 06/22/15   Authorization Type Orange card???  need to recheck in system   PT Start Time 1420   PT Stop Time 1510   PT Time Calculation (min) 50 min   Activity Tolerance Patient tolerated treatment well      No past medical history on file.  Past Surgical History  Procedure Laterality Date  . Dilation and curettage of uterus  2006  . Hemorrhoid surgery  2015  . 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  Visit Diagnosis:  Lumbar back pain with radiculopathy affecting left lower extremity - Plan: PT plan of care cert/re-cert  Joint stiffness - Plan: PT plan of care cert/re-cert      Subjective Assessment - 04/27/15 1427    Subjective LBP with left LE radiculopathy (pain) for 4 months  after a fall.  Some better with medication.     Patient is accompained by: Interpreter   Limitations House hold activities   How long can you sit comfortably? 45 min   How long can you walk comfortably? 30 min   Diagnostic tests x-ray and MRI   Currently in Pain? Yes   Pain Score 10-Worst pain ever   Pain Location Back   Pain Orientation Left   Pain Type Chronic pain   Pain Onset More than a month ago   Pain Frequency Constant   Aggravating Factors  up stairs, lifting, pushing heavy things; bending;    Pain Relieving Factors walking or lying down supine; sometimes sitting            [facility] PT Assessment - 04/27/15 1435    Assessment   Medical Diagnosis LBP with left radiculopathy    Onset Date/Surgical Date --  4 months   Hand Dominance Right   Next MD Visit no f/u   Prior Therapy no   Precautions   Precautions None   Restrictions   Weight Bearing Restrictions No   Balance Screen   Has the patient fallen in the past 6 months Yes   How many times? 1   Has the patient had a decrease in activity level because of a fear of falling?  No   Is the patient reluctant to leave their home because of a fear of falling?  No   Home Environment   Living Environment Private residence   Living Arrangements Parent   Type of Home House   Home Access Stairs to enter   Entrance Stairs-Number of Steps 12   Home Layout Two level   Prior Function   Level of Independence Independent   Vocation Full time employment   Vocation Requirements housekeeping in hotel   Leisure go to park, visit friends   Observation/Other Assessments   Focus on Therapeutic Outcomes (FOTO)  not taken secondary to language barrier   Posture/Postural Control   Posture/Postural Control No significant limitations   Posture Comments no shift   ROM / Strength   AROM / PROM /  Strength AROM;Strength   AROM   AROM Assessment Site Lumbar   Lumbar Flexion 60   Lumbar Extension 20   Lumbar - Right Side Bend 30   Lumbar - Left Side Bend 30   Strength   Strength Assessment Site Lumbar;Knee;Ankle   Right/Left Knee Right;Left   Right Knee Flexion 5/5   Right Knee Extension 5/5   Left Knee Flexion 5/5   Left Knee Extension 5/5   Right/Left Ankle Right;Left   Right Ankle Dorsiflexion 5/5   Right Ankle Plantar Flexion 5/5   Left Ankle Dorsiflexion 5/5   Left Ankle Plantar Flexion 5/5   Lumbar Flexion 4/5   Lumbar Extension 4/5   Palpation   Palpation comment Tender bilateral gluteals    Special Tests    Special Tests Lumbar   Lumbar Tests Slump Test;Straight Leg Raise;other   Slump test   Findings Negative   Side Left   Straight Leg Raise   Findings Positive   Side  Left   other   Findings Positive    Side  Left   Comments Extension directional preference                           PT Education - 04/27/15 1621    Education provided Yes   Education Details Prone press ups 10x every 2 hours, flexion avoidance, sitting postural correction; basic body mechanics   Person(s) Educated Patient   Methods Explanation;Demonstration;Handout   Comprehension Verbalized understanding;Returned demonstration          PT Short Term Goals - 04/27/15 1732    PT SHORT TERM GOAL #1   Title The patient express good understanding of limiting flexion initially, sitting postural correction and basic body mechanics   Time 4   Period Weeks   Status New   PT SHORT TERM GOAL #2   Title Patient will report symptoms centralized 25% of the time with usual ADLs   Time 4   Period Weeks   Status New   PT SHORT TERM GOAL #3   Title Patient will report an improvement in pain level to 7/10 with home and work activities   Time 4   Period Weeks   Status New           PT Long Term Goals - 04/27/15 1734    PT LONG TERM GOAL #1   Title The patient will be independent in self management in pain, posture and body mechanics awareness   Time 8   Period Weeks   Status New   PT LONG TERM GOAL #2   Title 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  She continues to report bilateral LBP and left LE pain extending all  the down the back of her calf to her foot intermittently.  MRI L4-5 and L5-S1  smal disc protrusion.   She is worsened with lifitng, stairs and bending.  Better with lying down and walking.  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  Tenderness in bilateral gluteals.  Decreased lumbar AROM:  flex 60, ext 20, right and left sidebending 30.  Repeated movement testing reveals an extension directional preference.  Negative slump test. Positive Left SLR.  Normal LE strength and sensation to light touch.  The patient lacks knowledge of postural correction and proper body mechanics.  Patient would benefit from PT to address these deficits.     Pt will benefit from skilled therapeutic intervention in order to improve on the following deficits Pain;Decreased range of motion;Decreased strength;Increased muscle spasms;Improper body mechanics   Rehab Potential Good   PT Frequency 2x / week   PT Duration 8 weeks   PT Treatment/Interventions ADLs/Self Care Home Management;Electrical Stimulation;Cryotherapy;Moist Heat;Ultrasound;Traction;Therapeutic activities;Therapeutic exercise;Patient/family education;Manual techniques;Dry needling;Taping   PT Next Visit Plan Assess response to prone press ups and for centralization; continue with McKenzie progression; review body mechanics related to work duties (hotel housekeeping) traction if unable to fully centralize; modalities PRN e-stim/heat;          Problem List Patient Active Problem List   Diagnosis Date Noted  . Vitamin D deficiency 04/08/2015  . Lumbar back pain with radiculopathy affecting left lower extremity 04/07/2015  . 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 Chronic pain of multiple joints 01/29/2015  . Irregular menses 10/01/2014  . Allergic rhinitis 10/01/2014  . Chalazion of left upper eyelid 10/01/2014  . 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 [place], [place], [address] Phone: [phone]   Fax:  [phone]

## 2015-04-27 NOTE — Patient Instructions (Signed)
Sleeping on Back  Place pillow under knees. A pillow with cervical support and a roll around waist are also helpful. Copyright  VHI. All rights reserved.  Sleeping on Side Place pillow between knees. Use cervical support under neck and a roll around waist as needed. Copyright  VHI. All rights reserved.   Sleeping on Stomach   If this is the only desirable sleeping position, place pillow under lower legs, and under stomach or chest as needed.  Posture - Sitting   Sit upright, head facing forward. Try using a roll to support lower back. Keep shoulders relaxed, and avoid rounded back. Keep hips level with knees. Avoid crossing legs for long periods. Stand to Sit / Sit to Stand   To sit: Bend knees to lower self onto front edge of chair, then scoot back on seat. To stand: Reverse sequence by placing one foot forward, and scoot to front of seat. Use rocking motion to stand up.   Work Height and Reach  Ideal work height is no more than 2 to 4 inches below elbow level when standing, and at elbow level when sitting. Reaching should be limited to arm's length, with elbows slightly bent.  Bending  Bend at hips and knees, not back. Keep feet shoulder-width apart.    Posture - Standing   Good posture is important. Avoid slouching and forward head thrust. Maintain curve in low back and align ears over shoul- ders, hips over ankles.  Alternating Positions   Alternate tasks and change positions frequently to reduce fatigue and muscle tension. Take rest breaks. Computer Work   Position work to face forward. Use proper work and seat height. Keep shoulders back and down, wrists straight, and elbows at right angles. Use chair that provides full back support. Add footrest and lumbar roll as needed.  Getting Into / Out of Car  Lower self onto seat, scoot back, then bring in one leg at a time. Reverse sequence to get out.  Dressing  Lie on back to pull socks or slacks over feet, or sit  and bend leg while keeping back straight.    Housework - Sink  Place one foot on ledge of cabinet under sink when standing at sink for prolonged periods.   Pushing / Pulling  Pushing is preferable to pulling. Keep back in proper alignment, and use leg muscles to do the work.  Deep Squat   Squat and lift with both arms held against upper trunk. Tighten stomach muscles without holding breath. Use smooth movements to avoid jerking.  Avoid Twisting   Avoid twisting or bending back. Pivot around using foot movements, and bend at knees if needed when reaching for articles.  Carrying Luggage   Distribute weight evenly on both sides. Use a cart whenever possible. Do not twist trunk. Move body as a unit.   Lifting Principles .Maintain proper posture and head alignment. .Slide object as close as possible before lifting. .Move obstacles out of the way. .Test before lifting; ask for help if too heavy. .Tighten stomach muscles without holding breath. .Use smooth movements; do not jerk. .Use legs to do the work, and pivot with feet. .Distribute the work load symmetrically and close to the center of trunk. .Push instead of pull whenever possible.   Ask For Help   Ask for help and delegate to others when possible. Coordinate your movements when lifting together, and maintain the low back curve.  Log Roll   Lying on back, bend left knee and place left   arm across chest. Roll all in one movement to the right. Reverse to roll to the left. Always move as one unit. Housework - Sweeping  Use long-handled equipment to avoid stooping.   Housework - Wiping  Position yourself as close as possible to reach work surface. Avoid straining your back.  Laundry - Unloading Wash   To unload small items at bottom of washer, lift leg opposite to arm being used to reach.  Gardening - Raking  Move close to area to be raked. Use arm movements to do the work. Keep back straight and avoid  twisting.     Cart  When reaching into cart with one arm, lift opposite leg to keep back straight.   Getting Into / Out of Bed  Lower self to lie down on one side by raising legs and lowering head at the same time. Use arms to assist moving without twisting. Bend both knees to roll onto back if desired. To sit up, start from lying on side, and use same move-ments in reverse. Housework - Vacuuming  Hold the vacuum with arm held at side. Step back and forth to move it, keeping head up. Avoid twisting.   Laundry - Armed forces training and education officer so that bending and twisting can be avoided.   Laundry - Unloading Dryer  Squat down to reach into clothes dryer or use a reacher.  Gardening - Weeding / Psychiatric nurse or Kneel. Knee pads may be helpful.                    Backward Bend (Standing)   Arch backward to make hollow of back deeper. Hold __2__ seconds. Repeat __10_ times per set. Do ___1_ sets per session. Every 2 hours.  http://orth.exer.us/178   Copyright  VHI. All rights reserved.  Press-Up   Press upper body upward, keeping hips in contact with floor. Keep lower back and buttocks relaxed. Hold __2__ seconds. Repeat __10__ times per set. Do _1__ sets per session. Every 2 hours.  http://orth.exer.us/94   Copyright  VHI. All rights reserved.

## 2015-04-30 ENCOUNTER — Ambulatory Visit: Payer: Self-pay | Attending: Family Medicine

## 2015-05-11 ENCOUNTER — Ambulatory Visit: Payer: Self-pay | Admitting: Physical Therapy

## 2015-05-11 DIAGNOSIS — M2560 Stiffness of unspecified joint, not elsewhere classified: Secondary | ICD-10-CM

## 2015-05-11 DIAGNOSIS — M5416 Radiculopathy, lumbar region: Secondary | ICD-10-CM

## 2015-05-11 NOTE — Therapy (Signed)
[place], [place], [address] Phone: [phone]   Fax:  [phone]  Physical Therapy Treatment  Patient Details  Name: Candice [name] MRN: [id number] Date of Birth: 01/19/1976 Referring Provider:  Boykin Nearing, MD  Encounter Date: 05/11/2015      PT End of Session - 05/11/15 1501    Visit Number 2   Number of Visits 16   Date for PT Re-Evaluation 06/22/15   PT Start Time 6073   PT Stop Time 1516   PT Time Calculation (min) 48 min   Activity Tolerance Patient tolerated treatment well   Behavior During Therapy [facility] for tasks assessed/performed      No past medical history on file.  Past Surgical History  Procedure Laterality Date  . Dilation and curettage of uterus  2006  . Hemorrhoid surgery  2015  . 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  Visit Diagnosis:  Lumbar back pain with radiculopathy affecting left lower extremity  Joint stiffness      Subjective Assessment - 05/11/15 1502    Subjective Patient reports 7/10 pain in the low back and left post thigh. She reports doing 5 press ups 2x/day  and that they help when she does them.   Patient is accompained by: Interpreter   Currently in Pain? Yes   Pain Score 7    Pain Location Back   Pain Orientation Left                         [facility] Adult PT Treatment/Exercise - 05/11/15 0001    Self-Care   Self-Care ADL's  Reviewed some ADL modifications to ensure pt understood. She reports compliance with modifications.   Exercises   Exercises Lumbar   Lumbar Exercises: Stretches   Prone on Elbows Stretch 1 rep;60 seconds  abolishes pain   Press Ups --  10 reps; cues for proper form   Press Ups Limitations patient only able to do morning at night at work so reviewed standing extensions   Modalities   Modalities Traction   Traction   Type of  Traction Lumbar  Pts wt 203lb   Min (lbs) 10   Max (lbs) 50   Hold Time 60   Rest Time 20   Time 15                PT Education - 05/11/15 1512    Education provided Yes   Education Details HEP: continue prone press ups or standing extension every two hours 10-30 reps.   Person(s) Educated Patient;Other (comment)  interpreter   Methods Explanation;Demonstration   Comprehension Verbalized understanding;Returned demonstration          PT Short Term Goals - 04/27/15 1732    PT SHORT TERM GOAL #1   Title The patient express good understanding of limiting flexion initially, sitting postural correction and basic body mechanics   Time 4   Period Weeks   Status New   PT SHORT TERM GOAL #2   Title Patient will report symptoms centralized 25% of the time with usual ADLs   Time 4   Period Weeks   Status New   PT SHORT TERM GOAL #3   Title Patient will report an improvement in pain level to 7/10 with home and work activities   Time 4   Period Weeks  Status New           PT Long Term Goals - 04/27/15 1734    PT LONG TERM GOAL #1   Title The patient will be independent in self management in pain, posture and body mechanics awareness   Time 8   Period Weeks   Status New   PT LONG TERM GOAL #2   Title 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 Lumbar traction was explained to patient through interpreter and wanted to try it. She tolerated it  well without increased pain. Extension protocol was reviewed and patient reported understanding through interpreter. No goals met as only 2nd visit.   PT Next Visit Plan assess frequency of extension protocol, assess response to traction, begin core strengthening as tolerated and continue traction if indicated.   Consulted and Agree with Plan of Care Patient        Problem List Patient Active Problem List   Diagnosis Date Noted  . Vitamin D deficiency 04/08/2015  . Lumbar back pain with radiculopathy affecting left lower extremity 04/07/2015  . 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 Chronic pain of multiple joints 01/29/2015  . Irregular menses 10/01/2014  . Allergic rhinitis 10/01/2014  . Chalazion of left upper eyelid 10/01/2014  . 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

## 2015-05-18 ENCOUNTER — Ambulatory Visit: Payer: Self-pay | Admitting: Physical Therapy

## 2015-05-18 DIAGNOSIS — M5416 Radiculopathy, lumbar region: Secondary | ICD-10-CM

## 2015-05-18 DIAGNOSIS — M2560 Stiffness of unspecified joint, not elsewhere classified: Secondary | ICD-10-CM

## 2015-05-18 NOTE — Therapy (Addendum)
[place], [place], [address] Phone: [phone]   Fax:  [phone]  Physical Therapy Treatment  Patient Details  Name: Candice [name] MRN: [id number] Date of Birth: [birth year] Referring Provider:  Boykin Nearing, MD  Encounter Date: 05/18/2015    No past medical history on file.  Past Surgical History  Procedure Laterality Date  . Dilation and curettage of uterus  2006  . Hemorrhoid surgery  2015  . 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  Visit Diagnosis:  Lumbar back pain with radiculopathy affecting left lower extremity  Joint stiffness      Subjective Assessment - 05/18/15 1418    Subjective traction painful it also increased her leg pain.  She wanted to go somewhere to get infrared treatment.  She did not want to have PT here. No treatment today.                                     PT Short Term Goals - 04/27/15 1732    PT SHORT TERM GOAL #1   Title The patient express good understanding of limiting flexion initially, sitting postural correction and basic body mechanics   Time 4   Period Weeks   Status New   PT SHORT TERM GOAL #2   Title Patient will report symptoms centralized 25% of the time with usual ADLs   Time 4   Period Weeks   Status New   PT SHORT TERM GOAL #3   Title Patient will report an improvement in pain level to 7/10 with home and work activities   Time 4   Period Weeks   Status New           PT Long Term Goals - 04/27/15 1734    PT LONG TERM GOAL #1   Title The patient will be independent in self management in pain, posture and body mechanics awareness   Time 8   Period Weeks   Status New   PT LONG TERM GOAL #2   Title 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  She wanted infrared light therapy.  She did not want other options available here.  A quick internet search revealed [facility] has infrared.  Phone and address given to patient. She was advised they were a private clinic and probably would not accept the orange card. Patient left with no treatment.       PHYSICAL THERAPY DISCHARGE SUMMARY  Visits from Start of Care: 3  Current functional level related to goals / functional outcomes: See clinical impression statement above   Remaining deficits: No progress toward  goals   Education / Equipment: Basic self care Plan: Patient agrees to discharge.  Patient goals were not met. Patient is being discharged due to the patient's request.  ?????       Ruben Im, PT 06/17/2015 8:09 AM Phone: [phone] Fax: [phone] Problem List Patient Active Problem List   Diagnosis Date Noted  . Vitamin D deficiency 04/08/2015  . Lumbar back pain with radiculopathy affecting left lower extremity 04/07/2015  . 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 Chronic pain of multiple joints 01/29/2015  . Irregular menses 10/01/2014  . Allergic rhinitis 10/01/2014  . Chalazion of left upper eyelid 10/01/2014  . 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

## 2015-05-25 ENCOUNTER — Ambulatory Visit: Payer: Self-pay | Attending: Family Medicine | Admitting: Physical Therapy

## 2015-06-01 ENCOUNTER — Encounter: Payer: Self-pay | Admitting: Physical Therapy

## 2015-06-29 ENCOUNTER — Ambulatory Visit: Payer: Self-pay | Attending: Family Medicine

## 2015-06-29 DIAGNOSIS — Z23 Encounter for immunization: Secondary | ICD-10-CM | POA: Insufficient documentation

## 2015-06-29 DIAGNOSIS — Z0000 Encounter for general adult medical examination without abnormal findings: Secondary | ICD-10-CM

## 2015-06-29 NOTE — Progress Notes (Signed)
Pt's here for flu vaccine. Pt denies having a fever. Pt waited 5 mins to ensure no adverse reaction to the vaccine. Pt tolerated injection well.  [employer] video: Interpreters name: Lindsi [phone]

## 2015-06-30 ENCOUNTER — Ambulatory Visit: Payer: Self-pay

## 2015-07-08 ENCOUNTER — Encounter: Payer: Self-pay | Admitting: Family Medicine

## 2015-07-08 ENCOUNTER — Ambulatory Visit: Payer: Self-pay | Attending: Family Medicine | Admitting: Family Medicine

## 2015-07-08 VITALS — BP 124/84 | HR 109 | Temp 98.7°F | Resp 18 | Ht 61.0 in | Wt 209.8 lb

## 2015-07-08 DIAGNOSIS — B353 Tinea pedis: Secondary | ICD-10-CM

## 2015-07-08 DIAGNOSIS — J029 Acute pharyngitis, unspecified: Secondary | ICD-10-CM | POA: Insufficient documentation

## 2015-07-08 DIAGNOSIS — B9681 Helicobacter pylori [H. pylori] as the cause of diseases classified elsewhere: Secondary | ICD-10-CM

## 2015-07-08 DIAGNOSIS — A048 Other specified bacterial intestinal infections: Secondary | ICD-10-CM

## 2015-07-08 DIAGNOSIS — R109 Unspecified abdominal pain: Secondary | ICD-10-CM

## 2015-07-08 DIAGNOSIS — R000 Tachycardia, unspecified: Secondary | ICD-10-CM

## 2015-07-08 MED ORDER — CETIRIZINE HCL 10 MG PO TABS: 10.0000 mg | ORAL_TABLET | Freq: Every day | ORAL | Status: DC

## 2015-07-08 MED ORDER — TERBINAFINE HCL 1 % EX CREA: 1.0000 "application " | TOPICAL_CREAM | Freq: Two times a day (BID) | CUTANEOUS | Status: DC

## 2015-07-08 NOTE — Assessment & Plan Note (Signed)
Normal exam Suspect seasonal allergies Zyrtec prescribed

## 2015-07-08 NOTE — Progress Notes (Signed)
Patient here for sore throat, diarrhea, and toe fungus. Patient reports sore throat has been present for years but gets worse when it is cold. Patient also reports she has diarrhea when she eats spicy food which has been present for years as well. Patient states she has a fungus infection between her toes on both feet.

## 2015-07-08 NOTE — Assessment & Plan Note (Signed)
A: abdominal pain with diarrhea P: Avoid food triggers H pylori testing

## 2015-07-08 NOTE — Patient Instructions (Addendum)
Carigan was seen today for sore throat and abdominal pain.  Diagnoses and all orders for this visit:  Abdominal pain, unspecified abdominal location -     Cancel: POCT urinalysis dipstick -     Cancel: POCT urine pregnancy -     H. pylori breath test  Sore throat -     Cancel: POCT rapid strep A -     cetirizine (ZYRTEC) 10 MG tablet; Take 1 tablet (10 mg total) by mouth daily.  Tinea pedis of both feet -     terbinafine (LAMISIL AT) 1 % cream; Apply 1 application topically 2 (two) times daily.   Avoid spicy foods if it causes diarrhea and abdominal pain  You will be called with lab results of H pylori testing   F/u in 3 months   Dr. Armen Pickup[employer]ches

## 2015-07-08 NOTE — Assessment & Plan Note (Signed)
A: tachy and dizzy in setting of recent diarrhea most likely mild dehydration P: Hydrate with fluid Avoid spicy foods since they trigger diarrhea

## 2015-07-08 NOTE — Progress Notes (Signed)
Subjective:  Patient ID: Candice SiasHala [name], female    DOB: January 02, 1976  Age: 39 y.o. MRN: [id number]  CC: Sore Throat and Abdominal Pain   HPI Candice [name] presents for   1. Chronic sore: for years. Worse with cold weather. Off and on. No associated fever. Sometimes with cough.   2. Diarrhea: when she eats spicy foods. Ongoing for years. Last occurred one week ago. There is associated bilateral lower abdominal pain. There was no blood last week. She reports hx of diagnosis with "stomach bacteria".   3. Fungus on feet: between 4th and 5th toes of both feet. There is severe itching.   Social History  Substance Use Topics  . Smoking status: Never Smoker   . Smokeless tobacco: Not on file  . Alcohol Use: No    Outpatient Prescriptions Prior to Visit  Medication Sig Dispense Refill  . cyclobenzaprine (FLEXERIL) 10 MG tablet Take 1 tablet (10 mg total) by mouth 3 (three) times daily as needed for muscle spasms. 30 tablet 2  . DHEA 50 MG CAPS Take 1 capsule by mouth.    . diclofenac (VOLTAREN) 75 MG EC tablet Take 1 tablet (75 mg total) by mouth 2 (two) times daily. 60 tablet 2  . gabapentin (NEURONTIN) 300 MG capsule Take 1 capsule (300 mg total) by mouth at bedtime. 30 capsule 3  . norgestimate-ethinyl estradiol (ORTHO-CYCLEN,SPRINTEC,PREVIFEM) 0.25-35 MG-MCG tablet Take 1 tablet by mouth daily. 1 Package 11  . Vitamin D, Ergocalciferol, (DRISDOL) 50000 UNITS CAPS capsule Take 1 capsule (50,000 Units total) by mouth every 7 (seven) days. For 8 weeks 8 capsule 0   No facility-administered medications prior to visit.    ROS Review of Systems  Constitutional: Negative for fever and chills.  HENT: Positive for sore throat.   Eyes: Negative for visual disturbance.  Respiratory: Negative for shortness of breath.   Cardiovascular: Negative for chest pain.  Gastrointestinal: Positive for abdominal pain and diarrhea. Negative for blood in stool.  Musculoskeletal: Negative for back pain and  arthralgias.  Skin: Positive for rash.  Allergic/Immunologic: Negative for immunocompromised state.  Neurological: Positive for dizziness.  Hematological: Negative for adenopathy. Does not bruise/bleed easily.  Psychiatric/Behavioral: Negative for suicidal ideas and dysphoric mood.    Objective:  Ht 5\' 1"  (1.549 m)  Wt 209 lb 12.8 oz (95.165 kg)  BMI 39.66 kg/m2  BP/Weight 07/08/2015 04/07/2015 02/12/2015  Systolic BP 124 120 130  Diastolic BP 84 70 73  Wt. (Lbs) 209.8 203 206.6  BMI 39.66 40.98 41.71   Pulse Readings from Last 3 Encounters:  04/07/15 99  02/12/15 101  01/29/15 97   Physical Exam  Constitutional: She is oriented to person, place, and time. She appears well-developed and well-nourished. No distress.  HENT:  Head: Normocephalic and atraumatic.  Right Ear: External ear normal.  Left Ear: External ear normal.  Nose: Mucosal edema present.  Mouth/Throat: Oropharynx is clear and moist. No oropharyngeal exudate.  Cardiovascular: Normal rate, regular rhythm, normal heart sounds and intact distal pulses.   Pulmonary/Chest: Effort normal and breath sounds normal.  Abdominal: Soft. Bowel sounds are normal. She exhibits no distension and no mass. There is no tenderness. There is no rebound and no guarding.  Musculoskeletal: She exhibits no edema.  Neurological: She is alert and oriented to person, place, and time.  Skin: Skin is warm and dry. No rash noted.  Psychiatric: She has a normal mood and affect.     Assessment & Plan:   Problem List Items  Addressed This Visit    Abdominal pain - Primary   Relevant Orders   H. pylori breath test   Sore throat   Relevant Medications   cetirizine (ZYRTEC) 10 MG tablet   Tachycardia   Relevant Orders   TSH   CBC    Other Visit Diagnoses    Tinea pedis of both feet        Relevant Medications    terbinafine (LAMISIL AT) 1 % cream       No orders of the defined types were placed in this encounter.    Follow-up:  No Follow-up on file.   Dessa Phi MD

## 2015-07-09 DIAGNOSIS — A048 Other specified bacterial intestinal infections: Secondary | ICD-10-CM | POA: Insufficient documentation

## 2015-07-09 LAB — CBC
HEMATOCRIT: 37.7 % (ref 36.0–46.0)
HEMOGLOBIN: 11.7 g/dL — AB (ref 12.0–15.0)
MCH: 24.4 pg — AB (ref 26.0–34.0)
MCHC: 31 g/dL (ref 30.0–36.0)
MCV: 78.5 fL (ref 78.0–100.0)
MPV: 10.3 fL (ref 8.6–12.4)
Platelets: 416 10*3/uL — ABNORMAL HIGH (ref 150–400)
RBC: 4.8 MIL/uL (ref 3.87–5.11)
RDW: 14.4 % (ref 11.5–15.5)
WBC: 10.6 10*3/uL — ABNORMAL HIGH (ref 4.0–10.5)

## 2015-07-09 LAB — TSH: TSH: 0.625 u[IU]/mL (ref 0.350–4.500)

## 2015-07-09 LAB — H. PYLORI BREATH TEST: H. pylori Breath Test: DETECTED — AB

## 2015-07-09 MED ORDER — CLARITHROMYCIN 500 MG PO TABS: 500.0000 mg | ORAL_TABLET | Freq: Two times a day (BID) | ORAL | Status: DC

## 2015-07-09 MED ORDER — AMOXICILLIN 500 MG PO CAPS: 1000.0000 mg | ORAL_CAPSULE | Freq: Two times a day (BID) | ORAL | Status: DC

## 2015-07-09 MED ORDER — OMEPRAZOLE 20 MG PO CPDR: 20.0000 mg | DELAYED_RELEASE_CAPSULE | Freq: Two times a day (BID) | ORAL | Status: DC

## 2015-07-09 NOTE — Addendum Note (Signed)
Addended by: Dessa PhiFUNCHES, Elyna Pangilinan on: 07/09/2015 04:02 PM   Modules accepted: Orders, SmartSet

## 2015-07-09 NOTE — Assessment & Plan Note (Signed)
H pylori positive Triple therapy ordered  Amoxicillin  1000 mg PO BDI for 10 days Clarithromycin 500 mg BID for 10 days  Omeprazole 20 mg PO BID

## 2015-07-13 ENCOUNTER — Telehealth: Payer: Self-pay | Admitting: *Deleted

## 2015-07-13 NOTE — Telephone Encounter (Signed)
-----   Message from Dessa PhiJosalyn Funches, MD sent at 07/09/2015  8:24 AM EDT ----- Normal TSH Normal CBC H pylori not yet resulted

## 2015-07-13 NOTE — Telephone Encounter (Signed)
Used [employer] Interpreter Arabic # [phone] LVM to return call

## 2015-07-13 NOTE — Telephone Encounter (Signed)
Patient returned phone call, please f/u °

## 2015-07-13 NOTE — Telephone Encounter (Signed)
Pt. Returned call and stated she is working. Pt. Stated she get off work at 4 p.m. Please f/u with pt.

## 2015-07-13 NOTE — Telephone Encounter (Signed)
-----   Message from Dessa PhiJosalyn Funches, MD sent at 07/09/2015  3:58 PM EDT ----- H pylori positive Triple therapy ordered  Amoxicillin  1000 mg PO BDI for 10 days Clarithromycin 500 mg BID for 10 days  Omeprazole 20 mg PO BID

## 2015-07-13 NOTE — Telephone Encounter (Signed)
Returned Pt call  LVM to return call 

## 2015-07-14 NOTE — Telephone Encounter (Signed)
Patient called back, she would like a call back with an interpreter. Please f/u

## 2015-07-14 NOTE — Telephone Encounter (Signed)
Used [employer] interpreter arabic [phone] Date of birth verified by pt  Normal CBC and TSH Positive for H pylori  Pt verbalized understanding Review medication with pt  Pt stated will pick up antibiotic and omeprazole tomorrow

## 2015-07-29 ENCOUNTER — Ambulatory Visit: Payer: Self-pay | Attending: Family Medicine | Admitting: Family Medicine

## 2015-07-29 ENCOUNTER — Encounter: Payer: Self-pay | Admitting: Family Medicine

## 2015-07-29 VITALS — BP 121/84 | HR 113 | Temp 98.4°F | Resp 16 | Ht 59.0 in | Wt 210.0 lb

## 2015-07-29 DIAGNOSIS — R000 Tachycardia, unspecified: Secondary | ICD-10-CM | POA: Insufficient documentation

## 2015-07-29 DIAGNOSIS — R42 Dizziness and giddiness: Secondary | ICD-10-CM | POA: Insufficient documentation

## 2015-07-29 DIAGNOSIS — E559 Vitamin D deficiency, unspecified: Secondary | ICD-10-CM | POA: Insufficient documentation

## 2015-07-29 LAB — POCT URINALYSIS DIPSTICK
BILIRUBIN UA: NEGATIVE
Glucose, UA: NEGATIVE
Ketones, UA: NEGATIVE
Leukocytes, UA: NEGATIVE
NITRITE UA: NEGATIVE
PH UA: 5.5
PROTEIN UA: NEGATIVE
Spec Grav, UA: >=1.03
UROBILINOGEN UA: 0.2

## 2015-07-29 MED ORDER — VITAMIN D (ERGOCALCIFEROL) 1.25 MG (50000 UNIT) PO CAPS: 50000.0000 [IU] | ORAL_CAPSULE | ORAL | Status: DC

## 2015-07-29 MED ORDER — MECLIZINE HCL 25 MG PO TABS: 25.0000 mg | ORAL_TABLET | Freq: Three times a day (TID) | ORAL | Status: DC | PRN

## 2015-07-29 NOTE — Progress Notes (Signed)
Dizziness x2 years  With movement  No hx tobacco  No pain

## 2015-07-29 NOTE — Patient Instructions (Addendum)
Candice [name] was seen today for dizziness.  Diagnoses and all orders for this visit:  Tachycardia -     POCT urinalysis dipstick -     EKG 12-Lead; Standing -     EKG 12-Lead  Vitamin D deficiency -     Vitamin D, Ergocalciferol, (DRISDOL) 50000 UNITS CAPS capsule; Take 1 capsule (50,000 Units total) by mouth every 7 (seven) days. For 8 weeks -     Vitamin D, 25-hydroxy; Future  Vertigo -     meclizine (ANTIVERT) 25 MG tablet; Take 1 tablet (25 mg total) by mouth 3 (three) times daily as needed for dizziness.  Other orders -     EKG 12-Lead  Increase oral intake of fluids.   F/u in 6 weeks   Dr. Armen PickupFunches

## 2015-07-29 NOTE — Assessment & Plan Note (Signed)
A: tachy with vertigo. Normal EKG. Elevated sp grav on UA P: Increase intake of oral fluids Meclizine prn  Home vestibular rehab exercise  

## 2015-07-29 NOTE — Assessment & Plan Note (Signed)
A: hx of vit D deficiency. Patient has taken oral supplement P: Recheck vit D level

## 2015-07-29 NOTE — Assessment & Plan Note (Signed)
A: tachy with vertigo. Normal EKG. Elevated sp grav on UA P: Increase intake of oral fluids Meclizine prn  Home vestibular rehab exercise

## 2015-07-29 NOTE — Progress Notes (Signed)
Patient ID: Candice [name] Fearn, female   DOB: July 02, 1976, 39 y.o.   MRN: [id number]   Subjective:  Patient ID: Candice [name] Wiest, female    DOB: July 02, 1976  Age: 39 y.o. MRN: [id number]  CC: Dizziness   HPI Yoali Oriordan presents for    1. Dizziness: x 2 years. Describes room spinning. Exacerbated by moving head up and down. Intermittent dizziness. She took vitamin D for known vitamin D deficiency. She admits to poor water intake. She has intermittent palpitations for past 5 years.   Social History  Substance Use Topics  . Smoking status: Never Smoker   . Smokeless tobacco: Not on file  . Alcohol Use: No   Outpatient Prescriptions Prior to Visit  Medication Sig Dispense Refill  . amoxicillin (AMOXIL) 500 MG capsule Take 2 capsules (1,000 mg total) by mouth 2 (two) times daily. 40 capsule 0  . cetirizine (ZYRTEC) 10 MG tablet Take 1 tablet (10 mg total) by mouth daily. 30 tablet 11  . clarithromycin (BIAXIN) 500 MG tablet Take 1 tablet (500 mg total) by mouth 2 (two) times daily. 20 tablet 0  . cyclobenzaprine (FLEXERIL) 10 MG tablet Take 1 tablet (10 mg total) by mouth 3 (three) times daily as needed for muscle spasms. 30 tablet 2  . DHEA 50 MG CAPS Take 1 capsule by mouth.    . diclofenac (VOLTAREN) 75 MG EC tablet Take 1 tablet (75 mg total) by mouth 2 (two) times daily. 60 tablet 2  . gabapentin (NEURONTIN) 300 MG capsule Take 1 capsule (300 mg total) by mouth at bedtime. 30 capsule 3  . norgestimate-ethinyl estradiol (ORTHO-CYCLEN,SPRINTEC,PREVIFEM) 0.25-35 MG-MCG tablet Take 1 tablet by mouth daily. 1 Package 11  . omeprazole (PRILOSEC) 20 MG capsule Take 1 capsule (20 mg total) by mouth 2 (two) times daily before a meal. 20 capsule 0  . terbinafine (LAMISIL AT) 1 % cream Apply 1 application topically 2 (two) times daily. (Patient not taking: Reported on 07/29/2015) 30 g 0  . Vitamin D, Ergocalciferol, (DRISDOL) 50000 UNITS CAPS capsule Take 1 capsule (50,000 Units total) by mouth every 7  (seven) days. For 8 weeks (Patient not taking: Reported on 07/29/2015) 8 capsule 0   No facility-administered medications prior to visit.   ROS Review of Systems  Constitutional: Negative for fever and chills.  Eyes: Negative for visual disturbance.  Respiratory: Negative for shortness of breath.   Cardiovascular: Positive for palpitations. Negative for chest pain.  Gastrointestinal: Negative for abdominal pain and blood in stool.  Musculoskeletal: Negative for back pain and arthralgias.  Skin: Negative for rash.  Allergic/Immunologic: Negative for immunocompromised state.  Neurological: Positive for dizziness.  Hematological: Negative for adenopathy. Does not bruise/bleed easily.  Psychiatric/Behavioral: Negative for suicidal ideas and dysphoric mood.    Objective:  BP 121/84 mmHg  Pulse 113  Temp(Src) 98.4 F (36.9 C) (Oral)  Resp 16  Ht 4\' 11"  (1.499 m)  Wt 210 lb (95.255 kg)  BMI 42.39 kg/m2  SpO2 99%  Pulse Readings from Last 3 Encounters:  07/29/15 113  07/08/15 109  04/07/15 99   BP/Weight 07/29/2015 07/08/2015 04/07/2015  Systolic BP 121 124 120  Diastolic BP 84 84 70  Wt. (Lbs) 210 209.8 203  BMI 42.39 39.66 40.98   Physical Exam  Constitutional: She is oriented to person, place, and time. She appears well-developed and well-nourished. No distress.  HENT:  Head: Normocephalic and atraumatic.  Right Ear: Tympanic membrane, external ear and ear canal normal.  Left Ear: External ear  and ear canal normal. Tympanic membrane is injected. Tympanic membrane is not scarred, not perforated, not erythematous, not retracted and not bulging.  Nose: Nose normal.  Cardiovascular: Regular rhythm, normal heart sounds and intact distal pulses.  Tachycardia present.   Pulmonary/Chest: Effort normal and breath sounds normal.  Musculoskeletal: She exhibits no edema.  Neurological: She is alert and oriented to person, place, and time.  Skin: Skin is warm and dry. No rash noted.    Psychiatric: She has a normal mood and affect.   EKG: normal EKG, normal sinus rhythm. HR 91.  UA: sp grav > 1.030  Assessment & Plan:   Problem List Items Addressed This Visit    Tachycardia - Primary   Relevant Orders   POCT urinalysis dipstick   EKG 12-Lead   Vertigo   Relevant Medications   meclizine (ANTIVERT) 25 MG tablet   Vitamin D deficiency   Relevant Medications   Vitamin D, Ergocalciferol, (DRISDOL) 50000 UNITS CAPS capsule      No orders of the defined types were placed in this encounter.    Follow-up: No Follow-up on file.   Dessa Phi MD

## 2015-08-11 ENCOUNTER — Telehealth: Payer: Self-pay | Admitting: Family Medicine

## 2015-08-11 NOTE — Telephone Encounter (Signed)
Patient would like result for lab

## 2015-08-17 ENCOUNTER — Telehealth: Payer: Self-pay | Admitting: Family Medicine

## 2015-08-17 NOTE — Telephone Encounter (Signed)
Patient would like a letter for her work stating that she can not lift anything heavy nor stand for a long time due to health condition.   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  Patient would like 2 copies. Please follow up with patient once letters are ready. Thank you.

## 2015-08-24 ENCOUNTER — Telehealth: Payer: Self-pay | Admitting: Family Medicine

## 2015-08-24 NOTE — Telephone Encounter (Signed)
Patient would like a letter for her work stating that she can not lift anything heavy nor stand for a long time due to health condition.   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  Patient would like 2 copies. Please follow up with patient once letters are ready. Thank you.  Time sensitive for work

## 2015-08-25 NOTE — Telephone Encounter (Signed)
LVM letter at front office ready to be pick up 

## 2015-08-25 NOTE — Telephone Encounter (Signed)
Please inform patient that letter is written and ready for pick up or will be mailed if she prefers

## 2015-08-25 NOTE — Telephone Encounter (Signed)
Letter written

## 2015-08-27 ENCOUNTER — Telehealth: Payer: Self-pay | Admitting: Family Medicine

## 2015-08-27 ENCOUNTER — Ambulatory Visit: Payer: Self-pay | Attending: Family Medicine

## 2015-08-27 DIAGNOSIS — E559 Vitamin D deficiency, unspecified: Secondary | ICD-10-CM

## 2015-08-27 NOTE — Telephone Encounter (Signed)
Patrient picked up letter today.

## 2015-08-28 LAB — VITAMIN D 25 HYDROXY (VIT D DEFICIENCY, FRACTURES): Vit D, 25-Hydroxy: 18 ng/mL — ABNORMAL LOW (ref 30–100)

## 2015-08-31 ENCOUNTER — Other Ambulatory Visit: Payer: Self-pay | Admitting: Family Medicine

## 2015-08-31 DIAGNOSIS — E559 Vitamin D deficiency, unspecified: Secondary | ICD-10-CM

## 2015-08-31 MED ORDER — VITAMIN D (ERGOCALCIFEROL) 1.25 MG (50000 UNIT) PO CAPS: 50000.0000 [IU] | ORAL_CAPSULE | ORAL | Status: DC

## 2015-09-02 NOTE — Telephone Encounter (Signed)
-----   Message from Jaclyn ShaggyEnobong Amao, MD sent at 08/31/2015  8:45 AM EST ----- Vitamin D level is still low and so have sent a refill for Drisdol to the pharmacy

## 2015-09-02 NOTE — Telephone Encounter (Signed)
Used [employer] interpreter Arabic [phone] Date of birth verified by pt  Lab results given to pt  Pt verbalized understanding

## 2015-09-03 ENCOUNTER — Ambulatory Visit: Payer: Self-pay | Attending: Family Medicine

## 2015-09-03 ENCOUNTER — Telehealth: Payer: Self-pay | Admitting: Family Medicine

## 2015-09-03 DIAGNOSIS — B353 Tinea pedis: Secondary | ICD-10-CM

## 2015-09-03 NOTE — Telephone Encounter (Signed)
Pt. Has been [facility] [place] but needs a referral to a clinic in [place]

## 2015-09-08 NOTE — Telephone Encounter (Signed)
[employer]pre[employer] Arabic [phone] Pt notified [facility] in [place] will take pt with out insurance

## 2015-09-08 NOTE — Telephone Encounter (Signed)
Unfortunately we don't have nobody here in [place] without insurance

## 2015-09-09 MED ORDER — KETOCONAZOLE 2 % EX CREA: 1.0000 "application " | TOPICAL_CREAM | Freq: Two times a day (BID) | CUTANEOUS | Status: DC

## 2015-09-09 NOTE — Telephone Encounter (Signed)
Cream ordered for foot fungus

## 2015-10-28 ENCOUNTER — Encounter: Payer: Self-pay | Admitting: Family Medicine

## 2015-10-28 ENCOUNTER — Ambulatory Visit: Payer: Self-pay | Attending: Family Medicine | Admitting: Family Medicine

## 2015-10-28 VITALS — BP 125/84 | HR 107 | Temp 98.8°F | Resp 16 | Ht 59.0 in | Wt 210.0 lb

## 2015-10-28 DIAGNOSIS — Z79899 Other long term (current) drug therapy: Secondary | ICD-10-CM | POA: Insufficient documentation

## 2015-10-28 DIAGNOSIS — M25512 Pain in left shoulder: Secondary | ICD-10-CM | POA: Insufficient documentation

## 2015-10-28 DIAGNOSIS — A048 Other specified bacterial intestinal infections: Secondary | ICD-10-CM

## 2015-10-28 DIAGNOSIS — R109 Unspecified abdominal pain: Secondary | ICD-10-CM | POA: Insufficient documentation

## 2015-10-28 DIAGNOSIS — M25511 Pain in right shoulder: Secondary | ICD-10-CM | POA: Insufficient documentation

## 2015-10-28 DIAGNOSIS — R1030 Lower abdominal pain, unspecified: Secondary | ICD-10-CM | POA: Insufficient documentation

## 2015-10-28 DIAGNOSIS — B9681 Helicobacter pylori [H. pylori] as the cause of diseases classified elsewhere: Secondary | ICD-10-CM | POA: Insufficient documentation

## 2015-10-28 DIAGNOSIS — Z0000 Encounter for general adult medical examination without abnormal findings: Secondary | ICD-10-CM

## 2015-10-28 DIAGNOSIS — B353 Tinea pedis: Secondary | ICD-10-CM | POA: Insufficient documentation

## 2015-10-28 DIAGNOSIS — Z6841 Body Mass Index (BMI) 40.0 and over, adult: Secondary | ICD-10-CM | POA: Insufficient documentation

## 2015-10-28 DIAGNOSIS — M542 Cervicalgia: Secondary | ICD-10-CM | POA: Insufficient documentation

## 2015-10-28 LAB — POCT URINE PREGNANCY: Preg Test, Ur: NEGATIVE

## 2015-10-28 LAB — POCT URINALYSIS DIPSTICK
Bilirubin, UA: NEGATIVE
GLUCOSE UA: NEGATIVE
Ketones, UA: NEGATIVE
LEUKOCYTES UA: NEGATIVE
NITRITE UA: NEGATIVE
PH UA: 5.5
PROTEIN UA: NEGATIVE
Spec Grav, UA: 1.025
Urobilinogen, UA: 0.2

## 2015-10-28 LAB — POCT GLYCOSYLATED HEMOGLOBIN (HGB A1C): Hemoglobin A1C: 6.3

## 2015-10-28 MED ORDER — KETOCONAZOLE 2 % EX CREA: 1.0000 "application " | TOPICAL_CREAM | Freq: Two times a day (BID) | CUTANEOUS | Status: DC

## 2015-10-28 MED ORDER — DICLOFENAC SODIUM 75 MG PO TBEC: 75.0000 mg | DELAYED_RELEASE_TABLET | Freq: Two times a day (BID) | ORAL | Status: DC

## 2015-10-28 MED ORDER — CYCLOBENZAPRINE HCL 10 MG PO TABS: 10.0000 mg | ORAL_TABLET | Freq: Three times a day (TID) | ORAL | Status: DC | PRN

## 2015-10-28 MED FILL — CYCLOBENZAPRINE 10 MG TAB: 10 | 10 days supply | Qty: 30 | Fill #0

## 2015-10-28 MED FILL — ?DICLOFENAC SOD DR 75 MG TA: 75 | 30 days supply | Qty: 60 | Fill #0

## 2015-10-28 MED FILL — KETOCONAZOLE 2% CREAM: 2 | 15 days supply | Qty: 30 | Fill #0

## 2015-10-28 NOTE — Progress Notes (Signed)
Use video interpreter Arabic# [id number] C/C Shoulder pain and neck  Abdomina pain, gas pain  Pain scale #8 No tobacco user  No suicidal thought in the past two weeks

## 2015-10-28 NOTE — Progress Notes (Signed)
chw

## 2015-10-28 NOTE — Assessment & Plan Note (Signed)
Treated, resolved

## 2015-10-28 NOTE — Assessment & Plan Note (Signed)
Normal exam Suspect IBS symptoms  Plan Reduce gas producing diet Then reduce lactose Then FODMAP

## 2015-10-28 NOTE — Assessment & Plan Note (Signed)
A: obesity P: Advised healthy diet Increase exercise aim for 5 times per week

## 2015-10-28 NOTE — Patient Instructions (Addendum)
Candice [name] was seen today for shoulder pain and abdominal pain.  Diagnoses and all orders for this visit:  Healthcare maintenance -     HgB A1c  Abdominal pain, unspecified abdominal location -     POCT urine pregnancy -     Cancel: Urinalysis -     POCT urinalysis dipstick  Morbid obesity, unspecified obesity type (HCC)  Neck pain -     cyclobenzaprine (FLEXERIL) 10 MG tablet; Take 1 tablet (10 mg total) by mouth 3 (three) times daily as needed for muscle spasms. -     diclofenac (VOLTAREN) 75 MG EC tablet; Take 1 tablet (75 mg total) by mouth 2 (two) times daily.  Shoulder pain, bilateral -     cyclobenzaprine (FLEXERIL) 10 MG tablet; Take 1 tablet (10 mg total) by mouth 3 (three) times daily as needed for muscle spasms. -     diclofenac (VOLTAREN) 75 MG EC tablet; Take 1 tablet (75 mg total) by mouth 2 (two) times daily.  Tinea pedis, unspecified laterality -     ketoconazole (NIZORAL) 2 % cream; Apply 1 application topically 2 (two) times daily. To feet   F/u in 6 weeks for shoulder pain and abdominal pain  Dr. Armen Pickup    Recommend dietary changes for possible IBS with diarrhea:  1. Exclude foods that increase flatulence (eg, beans, onions, celery, carrots, raisins, bananas, apricots, prunes, Brussels sprouts, wheat germ, pretzels, and bagels), alcohol, and caffeine.  Have symptoms improved?   If yes, continue to excluded above  If no,  2. Exclude lactose, this is animal milk, cheese, yogurt   Have symptoms improved?  If yes, continue to exclude 1 and 2.  If no,  3. Exclude FODMAP Characteristics and sources of common FODMAPs  F Fermentable    O Oligosaccharides Fructans, galacto-oligosaccharides Wheat, barley, rye, onion, leek, white part of spring onion, garlic, shallots, artichokes, beetroot, fennel, peas, chicory, pistachio, cashews, legumes, lentils, and chickpeas  D Disaccharides Lactose Milk, custard, ice cream, and yogurt  M Monosaccharides "Free fructose"  (fructose in excess of glucose) Apples, pears, mangoes, cherries, watermelon, asparagus, sugar snap peas, honey, high-fructose corn syrup  A And  P Polyols Sorbitol, mannitol, maltitol, and xylitol Apples, pears, apricots, cherries, nectarines, peaches, plums, watermelon, mushrooms, cauliflower, artificially sweetened chewing gum and confectionery   Be sure to drink plenty of fluids to keep up with water loses from diarrhea   Generic Shoulder Exercises EXERCISES  RANGE OF MOTION (ROM) AND STRETCHING EXERCISES These exercises may help you when beginning to rehabilitate your injury. Your symptoms may resolve with or without further involvement from your physician, physical therapist or athletic trainer. While completing these exercises, remember:   Restoring tissue flexibility helps normal motion to return to the joints. This allows healthier, less painful movement and activity.  An effective stretch should be held for at least 30 seconds.  A stretch should never be painful. You should only feel a gentle lengthening or release in the stretched tissue. ROM - Pendulum  Bend at the waist so that your right / left arm falls away from your body. Support yourself with your opposite hand on a solid surface, such as a table or a countertop.  Your right / left arm should be perpendicular to the ground. If it is not perpendicular, you need to lean over farther. Relax the muscles in your right / left arm and shoulder as much as possible.  Gently sway your hips and trunk so they move your right / left  arm without any use of your right / left shoulder muscles.  Progress your movements so that your right / left arm moves side to side, then forward and backward, and finally, both clockwise and counterclockwise.  Complete __________ repetitions in each direction. Many people use this exercise to relieve discomfort in their shoulder as well as to gain range of motion. Repeat __________ times. Complete this  exercise __________ times per day. STRETCH - Flexion, Standing  Stand with good posture. With an underhand grip on your right / left hand and an overhand grip on the opposite hand, grasp a broomstick or cane so that your hands are a little more than shoulder-width apart.  Keeping your right / left elbow straight and shoulder muscles relaxed, push the stick with your opposite hand to raise your right / left arm in front of your body and then overhead. Raise your arm until you feel a stretch in your right / left shoulder, but before you have increased shoulder pain.  Try to avoid shrugging your right / left shoulder as your arm rises by keeping your shoulder blade tucked down and toward your mid-back spine. Hold __________ seconds.  Slowly return to the starting position. Repeat __________ times. Complete this exercise __________ times per day. STRETCH - Internal Rotation  Place your right / left hand behind your back, palm-up.  Throw a towel or belt over your opposite shoulder. Grasp the towel/belt with your right / left hand.  While keeping an upright posture, gently pull up on the towel/belt until you feel a stretch in the front of your right / left shoulder.  Avoid shrugging your right / left shoulder as your arm rises by keeping your shoulder blade tucked down and toward your mid-back spine.  Hold __________. Release the stretch by lowering your opposite hand. Repeat __________ times. Complete this exercise __________ times per day. STRETCH - External Rotation and Abduction  Stagger your stance through a doorframe. It does not matter which foot is forward.  As instructed by your physician, physical therapist or athletic trainer, place your hands:  And forearms above your head and on the door frame.  And forearms at head-height and on the door frame.  At elbow-height and on the door frame.  Keeping your head and chest upright and your stomach muscles tight to prevent  over-extending your low-back, slowly shift your weight onto your front foot until you feel a stretch across your chest and/or in the front of your shoulders.  Hold __________ seconds. Shift your weight to your back foot to release the stretch. Repeat __________ times. Complete this stretch __________ times per day.  STRENGTHENING EXERCISES  These exercises may help you when beginning to rehabilitate your injury. They may resolve your symptoms with or without further involvement from your physician, physical therapist or athletic trainer. While completing these exercises, remember:   Muscles can gain both the endurance and the strength needed for everyday activities through controlled exercises.  Complete these exercises as instructed by your physician, physical therapist or athletic trainer. Progress the resistance and repetitions only as guided.  You may experience muscle soreness or fatigue, but the pain or discomfort you are trying to eliminate should never worsen during these exercises. If this pain does worsen, stop and make certain you are following the directions exactly. If the pain is still present after adjustments, discontinue the exercise until you can discuss the trouble with your clinician.  If advised by your physician, during your recovery, avoid activity  or exercises which involve actions that place your right / left hand or elbow above your head or behind your back or head. These positions stress the tissues which are trying to heal. STRENGTH - Scapular Depression and Adduction  With good posture, sit on a firm chair. Supported your arms in front of you with pillows, arm rests or a table top. Have your elbows in line with the sides of your body.  Gently draw your shoulder blades down and toward your mid-back spine. Gradually increase the tension without tensing the muscles along the top of your shoulders and the back of your neck.  Hold for __________ seconds. Slowly release the  tension and relax your muscles completely before completing the next repetition.  After you have practiced this exercise, remove the arm support and complete it in standing as well as sitting. Repeat __________ times. Complete this exercise __________ times per day.  STRENGTH - External Rotators  Secure a rubber exercise band/tubing to a fixed object so that it is at the same height as your right / left elbow when you are standing or sitting on a firm surface.  Stand or sit so that the secured exercise band/tubing is at your side that is not injured.  Bend your elbow 90 degrees. Place a folded towel or small pillow under your right / left arm so that your elbow is a few inches away from your side.  Keeping the tension on the exercise band/tubing, pull it away from your body, as if pivoting on your elbow. Be sure to keep your body steady so that the movement is only coming from your shoulder rotating.  Hold __________ seconds. Release the tension in a controlled manner as you return to the starting position. Repeat __________ times. Complete this exercise __________ times per day.  STRENGTH - Supraspinatus  Stand or sit with good posture. Grasp a __________ weight or an exercise band/tubing so that your hand is "thumbs-up," like when you shake hands.  Slowly lift your right / left hand from your thigh into the air, traveling about 30 degrees from straight out at your side. Lift your hand to shoulder height or as far as you can without increasing any shoulder pain. Initially, many people do not lift their hands above shoulder height.  Avoid shrugging your right / left shoulder as your arm rises by keeping your shoulder blade tucked down and toward your mid-back spine.  Hold for __________ seconds. Control the descent of your hand as you slowly return to your starting position. Repeat __________ times. Complete this exercise __________ times per day.  STRENGTH - Shoulder Extensors  Secure a  rubber exercise band/tubing so that it is at the height of your shoulders when you are either standing or sitting on a firm arm-less chair.  With a thumbs-up grip, grasp an end of the band/tubing in each hand. Straighten your elbows and lift your hands straight in front of you at shoulder height. Step back away from the secured end of band/tubing until it becomes tense.  Squeezing your shoulder blades together, pull your hands down to the sides of your thighs. Do not allow your hands to go behind you.  Hold for __________ seconds. Slowly ease the tension on the band/tubing as you reverse the directions and return to the starting position. Repeat __________ times. Complete this exercise __________ times per day.  STRENGTH - Scapular Retractors  Secure a rubber exercise band/tubing so that it is at the height of your shoulders  when you are either standing or sitting on a firm arm-less chair.  With a palm-down grip, grasp an end of the band/tubing in each hand. Straighten your elbows and lift your hands straight in front of you at shoulder height. Step back away from the secured end of band/tubing until it becomes tense.  Squeezing your shoulder blades together, draw your elbows back as you bend them. Keep your upper arm lifted away from your body throughout the exercise.  Hold __________ seconds. Slowly ease the tension on the band/tubing as you reverse the directions and return to the starting position. Repeat __________ times. Complete this exercise __________ times per day. STRENGTH - Scapular Depressors  Find a sturdy chair without wheels, such as a from a dining room table.  Keeping your feet on the floor, lift your bottom from the seat and lock your elbows.  Keeping your elbows straight, allow gravity to pull your body weight down. Your shoulders will rise toward your ears.  Raise your body against gravity by drawing your shoulder blades down your back, shortening the distance between  your shoulders and ears. Although your feet should always maintain contact with the floor, your feet should progressively support less body weight as you get stronger.  Hold __________ seconds. In a controlled and slow manner, lower your body weight to begin the next repetition. Repeat __________ times. Complete this exercise __________ times per day.    This information is not intended to replace advice given to you by your health care provider. Make sure you discuss any questions you have with your health care provider.   Document Released: 07/19/2005 Document Revised: 09/25/2014 Document Reviewed: 12/17/2008 [employer] Interactive Patient Education [employer]

## 2015-10-28 NOTE — Progress Notes (Signed)
Subjective:  Patient ID: Candice [name], female    DOB: 1976-04-19  Age: 40 y.o. MRN: [id number]  CC: Shoulder Pain and Abdominal Pain   HPI Candice [name] presents for    1. Abdominal pain: x 6 months with diarrhea and excess gas production. She has history of H pylori that was treated. She completed the treatment. She reports lower abdominal pain. No blood in stool. She has gained weight.   2. Shoulder pain:  X 3 months. Both shoulder and posterior upper back. No trauma. She does not exercise.   3. Morbid obesity: she is not exercising. She request medication for weight loss.    Social History  Substance Use Topics  . Smoking status: Never Smoker   . Smokeless tobacco: Not on file  . Alcohol Use: No   Outpatient Prescriptions Prior to Visit  Medication Sig Dispense Refill  . amoxicillin (AMOXIL) 500 MG capsule Take 2 capsules (1,000 mg total) by mouth 2 (two) times daily. 40 capsule 0  . cetirizine (ZYRTEC) 10 MG tablet Take 1 tablet (10 mg total) by mouth daily. 30 tablet 11  . clarithromycin (BIAXIN) 500 MG tablet Take 1 tablet (500 mg total) by mouth 2 (two) times daily. 20 tablet 0  . cyclobenzaprine (FLEXERIL) 10 MG tablet Take 1 tablet (10 mg total) by mouth 3 (three) times daily as needed for muscle spasms. 30 tablet 2  . DHEA 50 MG CAPS Take 1 capsule by mouth.    . diclofenac (VOLTAREN) 75 MG EC tablet Take 1 tablet (75 mg total) by mouth 2 (two) times daily. 60 tablet 2  . gabapentin (NEURONTIN) 300 MG capsule Take 1 capsule (300 mg total) by mouth at bedtime. 30 capsule 3  . ketoconazole (NIZORAL) 2 % cream Apply 1 application topically 2 (two) times daily. To feet 30 g 0  . meclizine (ANTIVERT) 25 MG tablet Take 1 tablet (25 mg total) by mouth 3 (three) times daily as needed for dizziness. 30 tablet 0  . norgestimate-ethinyl estradiol (ORTHO-CYCLEN,SPRINTEC,PREVIFEM) 0.25-35 MG-MCG tablet Take 1 tablet by mouth daily. 1 Package 11  . omeprazole (PRILOSEC) 20 MG  capsule Take 1 capsule (20 mg total) by mouth 2 (two) times daily before a meal. 20 capsule 0  . Vitamin D, Ergocalciferol, (DRISDOL) 50000 UNITS CAPS capsule Take 1 capsule (50,000 Units total) by mouth every 7 (seven) days. For 8 weeks 8 capsule 0   No facility-administered medications prior to visit.    ROS Review of Systems  Constitutional: Negative for fever and chills.  Eyes: Negative for visual disturbance.  Respiratory: Negative for shortness of breath.   Cardiovascular: Negative for chest pain and palpitations.  Gastrointestinal: Positive for abdominal pain and diarrhea. Negative for blood in stool.  Musculoskeletal: Positive for arthralgias and neck pain. Negative for back pain.  Skin: Positive for rash.  Allergic/Immunologic: Negative for immunocompromised state.  Neurological: Negative for dizziness.  Hematological: Negative for adenopathy. Does not bruise/bleed easily.  Psychiatric/Behavioral: Negative for suicidal ideas and dysphoric mood.    Objective:  BP 125/84 mmHg  Pulse 107  Temp(Src) 98.8 F (37.1 C) (Oral)  Resp 16  Ht  (1.499 m)  Wt 210 lb (95.255 kg)  BMI 42.39 kg/m2  SpO2 96%  BP/Weight 10/28/2015 07/29/2015 07/08/2015  Systolic BP 125 121 124  Diastolic BP 84 84 84  Wt. (Lbs) 210 210 209.8  BMI 42.39 42.39 39.66   Physical Exam  Constitutional: She is oriented to person, place, and time. She appears  well-developed and well-nourished. No distress.  HENT:  Head: Normocephalic and atraumatic.  Neck: Normal range of motion and full passive range of motion without pain. Neck supple. Muscular tenderness (posterior neck ) present. No spinous process tenderness present. No rigidity. Normal range of motion present.  Cardiovascular: Normal rate, regular rhythm, normal heart sounds and intact distal pulses.   Pulmonary/Chest: Effort normal and breath sounds normal.  Abdominal: Soft. Bowel sounds are normal. She exhibits no distension and no mass. There  is tenderness. There is no rebound and no guarding.  Musculoskeletal: She exhibits no edema.       Right shoulder: She exhibits normal range of motion, no tenderness, no bony tenderness, no swelling, no effusion, no crepitus, no deformity, no laceration, no pain, no spasm, normal pulse and normal strength.       Left shoulder: Normal. She exhibits normal range of motion, no tenderness, no bony tenderness, no swelling, no effusion, no crepitus, no deformity, no laceration, no pain, no spasm, normal pulse and normal strength.  Neurological: She is alert and oriented to person, place, and time.  Skin: Skin is warm and dry. No rash noted.     Psychiatric: She has a normal mood and affect.    Lab Results  Component Value Date   HGBA1C 6.30 10/28/2015   Assessment & Plan:   [name] was seen today for shoulder pain and abdominal pain.  Diagnoses and all orders for this visit:  Healthcare maintenance -     HgB A1c  Abdominal pain, unspecified abdominal location -     POCT urine pregnancy -     Cancel: Urinalysis -     POCT urinalysis dipstick  Morbid obesity, unspecified obesity type (HCC)  Neck pain -     cyclobenzaprine (FLEXERIL) 10 MG tablet; Take 1 tablet (10 mg total) by mouth 3 (three) times daily as needed for muscle spasms. -     diclofenac (VOLTAREN) 75 MG EC tablet; Take 1 tablet (75 mg total) by mouth 2 (two) times daily.  Shoulder pain, bilateral -     cyclobenzaprine (FLEXERIL) 10 MG tablet; Take 1 tablet (10 mg total) by mouth 3 (three) times daily as needed for muscle spasms. -     diclofenac (VOLTAREN) 75 MG EC tablet; Take 1 tablet (75 mg total) by mouth 2 (two) times daily.  Tinea pedis, unspecified laterality -     ketoconazole (NIZORAL) 2 % cream; Apply 1 application topically 2 (two) times daily. To feet  Helicobacter pylori infection   Follow-up: No Follow-up on file.   Dessa Phi MD

## 2015-10-28 NOTE — Assessment & Plan Note (Signed)
A; MSK pain due to lack of exercise P: Flexeril voltaren Home exercise

## 2015-11-22 ENCOUNTER — Ambulatory Visit: Payer: Self-pay | Attending: Family Medicine

## 2015-11-26 MED FILL — VIT D2 1.25 MG (50,000 UNIT: 1.25 MG | 60 days supply | Qty: 8 | Fill #0

## 2015-11-26 MED FILL — CYCLOBENZAPRINE 10 MG TAB: 10 | 10 days supply | Qty: 30 | Fill #1

## 2016-02-11 ENCOUNTER — Ambulatory Visit: Payer: Self-pay | Attending: Family Medicine

## 2016-02-22 ENCOUNTER — Other Ambulatory Visit: Payer: Self-pay | Admitting: Obstetrics and Gynecology

## 2016-02-22 MED FILL — MONO-LINYAH 28 TABLET: 0.25-35 | 84 days supply | Qty: 84 | Fill #0

## 2016-02-29 ENCOUNTER — Ambulatory Visit: Payer: Self-pay | Admitting: Family Medicine

## 2016-03-08 ENCOUNTER — Telehealth: Payer: Self-pay | Admitting: Family Medicine

## 2016-03-08 NOTE — Telephone Encounter (Signed)
Pt. 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 Please f/u with pt.

## 2016-03-09 NOTE — Telephone Encounter (Signed)
Patient will need f/u OV to assess functional capacity

## 2016-03-30 ENCOUNTER — Ambulatory Visit: Payer: Self-pay | Admitting: Family Medicine

## 2016-04-06 ENCOUNTER — Ambulatory Visit: Payer: Self-pay | Attending: Family Medicine | Admitting: Family Medicine

## 2016-04-06 ENCOUNTER — Encounter: Payer: Self-pay | Admitting: Family Medicine

## 2016-04-06 VITALS — BP 112/77 | HR 94 | Temp 99.0°F | Ht 59.0 in | Wt 203.6 lb

## 2016-04-06 DIAGNOSIS — K649 Unspecified hemorrhoids: Secondary | ICD-10-CM

## 2016-04-06 DIAGNOSIS — M4716 Other spondylosis with myelopathy, lumbar region: Secondary | ICD-10-CM

## 2016-04-06 DIAGNOSIS — E559 Vitamin D deficiency, unspecified: Secondary | ICD-10-CM

## 2016-04-06 LAB — HEMOCCULT GUIAC POC 1CARD (OFFICE): FECAL OCCULT BLD: POSITIVE — AB

## 2016-04-06 MED ORDER — POLYETHYLENE GLYCOL 3350 17 GM/SCOOP PO POWD: 17.0000 g | Freq: Every day | ORAL | Status: DC

## 2016-04-06 MED ORDER — HYDROCORTISONE ACE-PRAMOXINE 2.5-1 % RE CREA: 1.0000 "application " | TOPICAL_CREAM | Freq: Three times a day (TID) | RECTAL | Status: DC

## 2016-04-06 MED FILL — POLYETHYLENE GLYCOL 3350: 30 days supply | Qty: 510 | Fill #0

## 2016-04-06 MED FILL — HYDROCORT-PRAMOXINE 2.5-1%: 2.5-1 | 20 days supply | Qty: 30 | Fill #0

## 2016-04-06 NOTE — Patient Instructions (Addendum)
Candice [name] was seen today for back pain, letter for school/work and hemorrhoids.  Diagnoses and all orders for this visit:  Vitamin D deficiency -     Vitamin D, 25-hydroxy  Hemorrhoids, unspecified hemorrhoid type -     hydrocortisone-pramoxine (ANALPRAM HC) 2.5-1 % rectal cream; Place 1 application rectally 3 (three) times daily. -     polyethylene glycol powder (GLYCOLAX/MIRALAX) powder; Take 17 g by mouth daily.    F/u in 3 weeks for hemorrhoids   Dr. Armen PickupFunches

## 2016-04-06 NOTE — Assessment & Plan Note (Signed)
Chronic low back pain Pain free with sitting  Plan: Work restriction note provided

## 2016-04-06 NOTE — Progress Notes (Signed)
Subjective:  Patient ID: Candice [name], female    DOB: [birth year]  Age: 40 y.o. 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 Work restriction note: she has chronic low back pain since 11/2014 due to lumbar DJD. She works for [employer]. 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 She works 40 hrs per week. 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 When she is sitting there is no back pain. When she is lifting or carrying she has 10/10 pain. The pain moves down to her L and R leg sometimes.   2. Hemorrhoid: for the past 3 years. She has severe pain and blood in bowel movement. She has hx of hemorrhoid surgery in 2014 while living in [place]. The surgery helped for a little while but then the pain continued.   Social History  Substance Use Topics  . Smoking status: Never Smoker   . Smokeless tobacco: Not on file  . Alcohol Use: No    Outpatient Prescriptions Prior to Visit  Medication Sig Dispense Refill  . cetirizine (ZYRTEC) 10 MG tablet Take 1 tablet (10 mg total) by mouth daily. (Patient not taking: Reported on 10/28/2015) 30 tablet 11  . cyclobenzaprine (FLEXERIL) 10 MG tablet Take 1 tablet (10 mg total) by mouth 3 (three) times daily as needed for muscle spasms. 30 tablet 2  . DHEA 50 MG CAPS Take 1 capsule by mouth. Reported on 10/28/2015    . diclofenac (VOLTAREN) 75 MG EC tablet Take 1 tablet (75 mg total) by mouth 2 (two) times daily. 60 tablet 2  . gabapentin (NEURONTIN) 300 MG capsule Take 1 capsule (300 mg total) by mouth at bedtime. (Patient not taking: Reported on 10/28/2015) 30 capsule 3  . ketoconazole (NIZORAL) 2 % cream Apply 1 application topically 2 (two) times daily. To feet 30 g 0  . MONO-LINYAH 0.25-35 MG-MCG tablet TAKE 1 TABLET BY MOUTH DAILY 28 tablet 11  . Vitamin D, Ergocalciferol, (DRISDOL) 50000 UNITS CAPS capsule  Take 1 capsule (50,000 Units total) by mouth every 7 (seven) days. For 8 weeks (Patient not taking: Reported on 10/28/2015) 8 capsule 0   No facility-administered medications prior to visit.    ROS Review of Systems  Constitutional: Negative for fever and chills.  Eyes: Negative for visual disturbance.  Respiratory: Negative for shortness of breath.   Cardiovascular: Negative for chest pain.  Gastrointestinal: Positive for constipation, blood in stool and rectal pain. Negative for nausea, vomiting, abdominal pain, diarrhea, abdominal distention and anal bleeding.  Musculoskeletal: Positive for back pain. Negative for arthralgias.  Skin: Negative for rash.  Allergic/Immunologic: Negative for immunocompromised state.  Hematological: Negative for adenopathy. Does not bruise/bleed easily.  Psychiatric/Behavioral: Negative for suicidal ideas and dysphoric mood.    Objective:  BP 112/77 mmHg  Pulse 94  Temp(Src) 99 F (37.2 C) (Oral)  Ht 4\' 11"  (1.499 m)  Wt 203 lb 9.6 oz (92.352 kg)  BMI 41.10 kg/m2  SpO2 98%  LMP 03/20/2016 (Exact Date)  BP/Weight 04/06/2016 10/28/2015 07/29/2015  Systolic BP 112 125 121  Diastolic BP 77 84 84  Wt. (Lbs) 203.6 210 210  BMI 41.1 42.39 42.39    Physical Exam  Constitutional: She is oriented to person, place, and time. She appears well-developed and well-nourished. No distress.  HENT:  Head: Normocephalic  and atraumatic.  Cardiovascular: Normal rate, regular rhythm, normal heart sounds and intact distal pulses.   Pulmonary/Chest: Effort normal and breath sounds normal.  Genitourinary: Rectal exam shows external hemorrhoid and anal tone abnormal. Rectal exam shows no fissure, no mass and no tenderness. Guaiac positive stool.     Musculoskeletal: She exhibits no edema.       Lumbar back: She exhibits decreased range of motion, tenderness and pain. She exhibits no bony tenderness, no swelling, no edema, no deformity and no laceration.  Neurological:  She is alert and oriented to person, place, and time.  Skin: Skin is warm and dry. No rash noted.  Psychiatric: She has a normal mood and affect.     Assessment & Plan:   There are no diagnoses linked to this encounter. Candice [name] was seen today for back pain, letter for school/work and hemorrhoids.  Diagnoses and all orders for this visit:  Vitamin D deficiency -     Vitamin D, 25-hydroxy  Hemorrhoids, unspecified hemorrhoid type -     hydrocortisone-pramoxine (ANALPRAM HC) 2.5-1 % rectal cream; Place 1 application rectally 3 (three) times daily. -     polyethylene glycol powder (GLYCOLAX/MIRALAX) powder; Take 17 g by mouth daily. -     Cancel: CBC -     CBC  Lumbar spondylosis with myelopathy   Meds ordered this encounter  Medications  . hydrocortisone-pramoxine (ANALPRAM HC) 2.5-1 % rectal cream    Sig: Place 1 application rectally 3 (three) times daily.    Dispense:  30 g    Refill:  0  . polyethylene glycol powder (GLYCOLAX/MIRALAX) powder    Sig: Take 17 g by mouth daily.    Dispense:  3350 g    Refill:  1    Follow-up: Return in about 3 weeks (around 04/27/2016) for hemorrhoids .   Dessa Phi MD

## 2016-04-06 NOTE — Assessment & Plan Note (Signed)
Small painful external hemorrhoids  Pln: analpram HC miralax F/u in 4 weeks for recheck

## 2016-05-04 ENCOUNTER — Ambulatory Visit: Payer: Self-pay | Attending: Internal Medicine | Admitting: Physician Assistant

## 2016-05-04 ENCOUNTER — Encounter: Payer: Self-pay | Admitting: Physician Assistant

## 2016-05-04 VITALS — BP 121/78 | HR 111 | Temp 98.2°F | Wt 207.4 lb

## 2016-05-04 DIAGNOSIS — L608 Other nail disorders: Secondary | ICD-10-CM

## 2016-05-04 DIAGNOSIS — L659 Nonscarring hair loss, unspecified: Secondary | ICD-10-CM

## 2016-05-04 DIAGNOSIS — Z79899 Other long term (current) drug therapy: Secondary | ICD-10-CM | POA: Insufficient documentation

## 2016-05-04 DIAGNOSIS — E559 Vitamin D deficiency, unspecified: Secondary | ICD-10-CM

## 2016-05-04 LAB — CBC WITH DIFFERENTIAL/PLATELET
BASOS ABS: 0 {cells}/uL (ref 0–200)
BASOS PCT: 0 %
EOS PCT: 4 %
Eosinophils Absolute: 332 cells/uL (ref 15–500)
HCT: 39 % (ref 35.0–45.0)
Hemoglobin: 12.3 g/dL (ref 11.7–15.5)
Lymphocytes Relative: 29 %
Lymphs Abs: 2407 cells/uL (ref 850–3900)
MCH: 25.1 pg — AB (ref 27.0–33.0)
MCHC: 31.5 g/dL — ABNORMAL LOW (ref 32.0–36.0)
MCV: 79.6 fL — ABNORMAL LOW (ref 80.0–100.0)
MONOS PCT: 6 %
MPV: 9.8 fL (ref 7.5–12.5)
Monocytes Absolute: 498 cells/uL (ref 200–950)
NEUTROS ABS: 5063 {cells}/uL (ref 1500–7800)
Neutrophils Relative %: 61 %
PLATELETS: 350 10*3/uL (ref 140–400)
RBC: 4.9 MIL/uL (ref 3.80–5.10)
RDW: 14.6 % (ref 11.0–15.0)
WBC: 8.3 10*3/uL (ref 3.8–10.8)

## 2016-05-04 LAB — TSH: TSH: 0.69 m[IU]/L

## 2016-05-04 LAB — VITAMIN B12: Vitamin B-12: 849 pg/mL (ref 200–1100)

## 2016-05-04 LAB — FOLATE: Folate: 9.4 ng/mL (ref >5.4)

## 2016-05-04 LAB — SEDIMENTATION RATE: SED RATE: 28 mm/h — AB (ref 0–20)

## 2016-05-04 MED ORDER — VITAMIN D (ERGOCALCIFEROL) 1.25 MG (50000 UNIT) PO CAPS: 50000.0000 [IU] | ORAL_CAPSULE | ORAL | 0 refills | Status: DC

## 2016-05-04 MED FILL — VIT D2 1.25 MG (50,000 UNIT: 1.25 MG | 84 days supply | Qty: 12 | Fill #0

## 2016-05-04 NOTE — Patient Instructions (Signed)
Get a multivitamin to take daily

## 2016-05-04 NOTE — Progress Notes (Signed)
Pt states she is here for the following:  Hair Loss - Pt states she has had this problem since being in her country [place] but the hair loss has gotten worse in the last six months since being in the [place]. Pt states she has never taken prescribed medication for this problem  Splitting Fingernails - Same as above

## 2016-05-04 NOTE — Progress Notes (Signed)
Candice [name], is a 40 y.o. female  BJY:[id number]:[id number]  YQM:[id number]  DOB - [birth year]  Subjective:  Chief Complaint and HPI: Candice [name] is a 40 y.o. female here today for hair loss and nail changes.  She has been losing about a full hand-full of hair daily(she brought a hairnet with her that has a good deal of hair in it-she says she loses this amount daily). She feels like she started losing hair about a year ago and then it worsened over the last 6 months. She moved here from [place].  Her husband is still there.  She has had vit D deficiency in the past and is not on any supplement.  She does not eat any special diet.  She has also noticed "dips" in her fingernails and that her nails split and peel easily.   She was here a few weeks ago for hemorrhoids which have resolved.    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  ROS:   Constitutional:  No f/c, No night sweats, No unexplained weight loss. EENT:  No vision changes, No blurry vision, No hearing changes. No mouth, throat, or ear problems.  Respiratory: No cough, No SOB Cardiac: No CP, no palpitations GI:  No abd pain, No N/V/D. GU: No Urinary s/sx Musculoskeletal: No joint pain Neuro: No headache, no dizziness, no motor weakness.  Skin: No rash Endocrine:  No polydipsia. No polyuria.  Psych: Denies SI/HI  No problems updated.  ALLERGIES: No Known Allergies  PAST MEDICAL HISTORY: No past medical history on file.  MEDICATIONS AT HOME: Prior to Admission medications   Medication Sig Start Date End Date Taking? Authorizing Provider  cyclobenzaprine (FLEXERIL) 10 MG tablet Take 1 tablet (10 mg total) by mouth 3 (three) times daily as needed for muscle spasms. 10/28/15  Yes Josalyn Funches, MD  DHEA 50 MG CAPS Take 1 capsule by mouth. Reported on 04/06/2016    Historical Provider, MD  hydrocortisone-pramoxine (ANALPRAM HC) 2.5-1 % rectal cream Place 1 application rectally 3 (three) times daily. Patient not taking:  Reported on 05/04/2016 04/06/16   Dessa PhiJosalyn Funches, MD  ketoconazole (NIZORAL) 2 % cream Apply 1 application topically 2 (two) times daily. To feet Patient not taking: Reported on 05/04/2016 10/28/15   Dessa PhiJosalyn Funches, MD  MONO-LINYAH 0.25-35 MG-MCG tablet TAKE 1 TABLET BY MOUTH DAILY Patient not taking: Reported on 05/04/2016 02/22/16   Catalina AntiguaPeggy Constant, MD  polyethylene glycol powder (GLYCOLAX/MIRALAX) powder Take 17 g by mouth daily. Patient not taking: Reported on 05/04/2016 04/06/16   Dessa PhiJosalyn Funches, MD  Vitamin D, Ergocalciferol, (DRISDOL) 50000 units CAPS capsule Take 1 capsule (50,000 Units total) by mouth every 7 (seven) days. 05/04/16   Anders SimmondsAngela M Brittish Bolinger, PA-C     Objective:  EXAM:   Vitals:   05/04/16 1039  BP: 121/78  Pulse: (!) 111  Temp: 98.2 F (36.8 C)  TempSrc: Oral  Weight: 207 lb 6.4 oz (94.1 kg)    General appearance : A&OX3. NAD. Non-toxic-appearing. Obese. HEENT: Atraumatic and Normocephalic.  PERRLA. EOM intact.  TM clear B. Mouth-MMM, post pharynx WNL w/o erythema, No PND. Neck: supple, no JVD. No cervical lymphadenopathy. No thyromegaly Chest/Lungs:  Breathing-non-labored, Good air entry bilaterally, breath sounds normal without rales, rhonchi, or wheezing  CVS: S1 S2 regular, no murmurs, gallops, rubs (Rate 86 on exam) Extremities: Bilateral Lower Ext shows no edema, both legs are warm to touch with = pulse throughout Neurology:  CN II-XII grossly intact, Non focal.   Psych:  TP linear. J/I WNL.  Normal speech. Appropriate eye contact and affect.  Skin:  No Rash.  Scalp is without abnormality.  Hair loss is in even distribution with no patches or discreet areas of missing hair.  Nails-No onychomycosis or onycholysis.  There is ridging of the nails with a horizontal pitting present on some, but not all nails(she denies any injury to her nails)  Data Review Lab Results  Component Value Date   HGBA1C 6.30 10/28/2015     Assessment & Plan   1. Hair loss - TSH -  CBC with Differential/Platelet - Sedimentation rate - Vitamin B12 - Folate Take a multivitamin daily and proper nutrition discussed.   2. Vitamin D deficiency From 08/2015 labs-she has not taken replacement yet.  - Vitamin D, Ergocalciferol, (DRISDOL) 50000 units CAPS capsule; Take 1 capsule (50,000 Units total) by mouth every 7 (seven) days.  Dispense: 30 capsule; Refill: 0  3. Nail pitting/ridging - TSH - CBC with Differential/Platelet - Sedimentation rate - Vitamin B12 - Folate  Patient have been counseled extensively about nutrition and exercise  Return in about 6 weeks (around 06/15/2016) for f/up on hemorrhoids and hair loss/nail changes.  The patient was given clear instructions to go to ER or return to medical center if symptoms don't improve, worsen or new problems develop. The patient verbalized understanding. The patient was told to call to get lab results if they haven't heard anything in the next week.     Georgian CoAngela Sadler Teschner, PA-C [name] [place], [place] [phone]   05/04/2016, 11:07 AM

## 2016-05-08 MED FILL — CYCLOBENZAPRINE 10 MG TAB: 10 | 10 days supply | Qty: 30 | Fill #2

## 2016-05-12 ENCOUNTER — Telehealth: Payer: Self-pay

## 2016-05-12 NOTE — Telephone Encounter (Signed)
[employer] Casimiro NeedleMichael Id: [id number] contacted pt to go over lab results pt did not answer lvm for pt to give me a call at her earliest convenience will try contacting pt at another day/time

## 2016-05-15 ENCOUNTER — Telehealth: Payer: Self-pay

## 2016-05-15 NOTE — Telephone Encounter (Signed)
Pt came into the office today to schedule an appointment went over pt lab results with her and is aware and doesn't have any questions or concerns

## 2016-05-26 ENCOUNTER — Ambulatory Visit: Payer: Self-pay | Attending: Internal Medicine

## 2016-06-05 ENCOUNTER — Ambulatory Visit: Payer: Self-pay | Attending: Family Medicine | Admitting: Family Medicine

## 2016-06-05 ENCOUNTER — Encounter: Payer: Self-pay | Admitting: Family Medicine

## 2016-06-05 VITALS — BP 125/96 | HR 100 | Temp 98.2°F | Ht 59.0 in | Wt 207.6 lb

## 2016-06-05 DIAGNOSIS — J309 Allergic rhinitis, unspecified: Secondary | ICD-10-CM | POA: Insufficient documentation

## 2016-06-05 DIAGNOSIS — Z23 Encounter for immunization: Secondary | ICD-10-CM

## 2016-06-05 DIAGNOSIS — R1032 Left lower quadrant pain: Secondary | ICD-10-CM | POA: Insufficient documentation

## 2016-06-05 LAB — HEMOCCULT GUIAC POC 1CARD (OFFICE): Fecal Occult Blood, POC: NEGATIVE

## 2016-06-05 MED ORDER — FLUTICASONE PROPIONATE 50 MCG/ACT NA SUSP: 2.0000 | Freq: Every day | NASAL | 6 refills | Status: DC

## 2016-06-05 MED ORDER — LORATADINE 10 MG PO TABS: 10.0000 mg | ORAL_TABLET | Freq: Every day | ORAL | 11 refills | Status: DC

## 2016-06-05 MED ORDER — DICYCLOMINE HCL 20 MG PO TABS: 20.0000 mg | ORAL_TABLET | Freq: Three times a day (TID) | ORAL | 1 refills | Status: DC

## 2016-06-05 NOTE — Patient Instructions (Addendum)
Candice [name] was seen today for abdominal pain.  Diagnoses and all orders for this visit:  Allergic rhinitis, unspecified allergic rhinitis type -     fluticasone (FLONASE) 50 MCG/ACT nasal spray; Place 2 sprays into both nostrils daily. -     loratadine (CLARITIN) 10 MG tablet; Take 1 tablet (10 mg total) by mouth daily.  Left lower quadrant pain -     dicyclomine (BENTYL) 20 MG tablet; Take 1 tablet (20 mg total) by mouth 3 (three) times daily before meals. -     POCT occult blood stool   F/u with me in 4 weeks for abdominal pain  Dr. Armen PickupFunches

## 2016-06-05 NOTE — Assessment & Plan Note (Addendum)
LLQ pain x one years, heme negative stool. Non focal exam. No fever, weight loss or blood in stool.  DDx: IBS, referred pain for pelvic etiology  P: Trial of bentyl Pelvic exam and f/u and possible imaging

## 2016-06-05 NOTE — Progress Notes (Signed)
Subjective:  Patient ID: Candice [name], female    DOB: 27-Oct-1975  Age: 40 y.o. MRN: [id number]  CC: Abdominal Pain   HPI Candice [name] presents for    1. Itching in nose and throat: for one year off and on. She was previously treated with flonase and claritin. Symptoms comes and goes. Also having pain in throat that comes and goes. Has some pain right now. No cough or sneezing.   2. Abdominal pain: severe  pain and gas in stomach has been ongoing for 1 year.  She moved to [place] in 2015 from [place]. L side pains. Denies constipation, diarrhea, nausea and vomiting. Sharp pains. Pain occurs almost every other day. Pain last for about 10 minutes. She has hx of H. Pylori treated with triple therapy in 06/2015.    Social History  Substance Use Topics  . Smoking status: Never Smoker  . Smokeless tobacco: Never Used  . Alcohol use No   Outpatient Medications Prior to Visit  Medication Sig Dispense Refill  . cyclobenzaprine (FLEXERIL) 10 MG tablet Take 1 tablet (10 mg total) by mouth 3 (three) times daily as needed for muscle spasms. 30 tablet 2  . Vitamin D, Ergocalciferol, (DRISDOL) 50000 units CAPS capsule Take 1 capsule (50,000 Units total) by mouth every 7 (seven) days. 30 capsule 0  . DHEA 50 MG CAPS Take 1 capsule by mouth. Reported on 04/06/2016    . hydrocortisone-pramoxine (ANALPRAM HC) 2.5-1 % rectal cream Place 1 application rectally 3 (three) times daily. (Patient not taking: Reported on 06/05/2016) 30 g 0  . ketoconazole (NIZORAL) 2 % cream Apply 1 application topically 2 (two) times daily. To feet (Patient not taking: Reported on 06/05/2016) 30 g 0  . MONO-LINYAH 0.25-35 MG-MCG tablet TAKE 1 TABLET BY MOUTH DAILY (Patient not taking: Reported on 06/05/2016) 28 tablet 11  . polyethylene glycol powder (GLYCOLAX/MIRALAX) powder Take 17 g by mouth daily. (Patient not taking: Reported on 06/05/2016) 3350 g 1   No facility-administered medications prior to visit.     ROS Review of  Systems  Constitutional: Negative for chills and fever.  HENT: Positive for sore throat.   Eyes: Positive for itching. Negative for visual disturbance.  Respiratory: Negative for shortness of breath.   Cardiovascular: Negative for chest pain.  Gastrointestinal: Positive for abdominal pain. Negative for abdominal distention, anal bleeding, blood in stool, constipation, diarrhea, nausea, rectal pain and vomiting.  Musculoskeletal: Negative for arthralgias and back pain.  Skin: Negative for rash.  Allergic/Immunologic: Negative for immunocompromised state.  Hematological: Negative for adenopathy. Does not bruise/bleed easily.  Psychiatric/Behavioral: Negative for dysphoric mood and suicidal ideas.    Objective:  BP (!) 125/96 (BP Location: Right Arm, Patient Position: Sitting, Cuff Size: Large)   Pulse 100   Temp 98.2 F (36.8 C) (Oral)   Ht 4\' 11"  (1.499 m)   Wt 207 lb 9.6 oz (94.2 kg)   SpO2 99%   BMI 41.93 kg/m   BP/Weight 06/05/2016 05/04/2016 04/06/2016  Systolic BP 125 121 112  Diastolic BP 96 78 77  Wt. (Lbs) 207.6 207.4 203.6  BMI 41.93 41.89 41.1    Physical Exam  Constitutional: She is oriented to person, place, and time. She appears well-developed and well-nourished. No distress.  HENT:  Head: Normocephalic and atraumatic.  Cardiovascular: Normal rate, regular rhythm, normal heart sounds and intact distal pulses.   Pulmonary/Chest: Effort normal and breath sounds normal.  Abdominal: Soft. Bowel sounds are normal. She exhibits no distension and no mass.  There is no tenderness. There is no rebound and no guarding.  Genitourinary: Rectal exam shows guaiac negative stool.     Musculoskeletal: She exhibits no edema.  Neurological: She is alert and oriented to person, place, and time.  Skin: Skin is warm and dry. No rash noted.  Psychiatric: She has a normal mood and affect.     Assessment & Plan:  Candice [name] was seen today for abdominal pain.  Diagnoses and all orders  for this visit:  Allergic rhinitis, unspecified allergic rhinitis type -     fluticasone (FLONASE) 50 MCG/ACT nasal spray; Place 2 sprays into both nostrils daily. -     loratadine (CLARITIN) 10 MG tablet; Take 1 tablet (10 mg total) by mouth daily.  Left lower quadrant pain -     dicyclomine (BENTYL) 20 MG tablet; Take 1 tablet (20 mg total) by mouth 3 (three) times daily before meals. -     POCT occult blood stool  Encounter for immunization -     Flu Vaccine QUAD 36+ mos IM   There are no diagnoses linked to this encounter.  No orders of the defined types were placed in this encounter.   Follow-up: Return in about 4 weeks (around 07/03/2016) for abdominal pain .   Dessa PhiJosalyn Justin Meisenheimer MD

## 2016-06-05 NOTE — Progress Notes (Signed)
Pt states she has too much gas in her belly, pt also has itching in her throat and nose. Pt is getting flu shot today

## 2016-06-20 MED FILL — FLUTICASONE PROP 50 MCG SPR: 50 | 20 days supply | Qty: 16 | Fill #0

## 2016-06-20 MED FILL — ?DICYCLOMINE 20 MG TABLET: 20 | 30 days supply | Qty: 90 | Fill #0

## 2017-02-26 ENCOUNTER — Ambulatory Visit: Payer: Self-pay | Attending: Internal Medicine

## 2017-02-27 ENCOUNTER — Ambulatory Visit: Payer: Self-pay | Attending: Internal Medicine | Admitting: Physician Assistant

## 2017-02-27 VITALS — BP 105/78 | HR 95 | Temp 98.3°F | Resp 18 | Ht 62.0 in | Wt 206.2 lb

## 2017-02-27 DIAGNOSIS — Z79899 Other long term (current) drug therapy: Secondary | ICD-10-CM | POA: Insufficient documentation

## 2017-02-27 DIAGNOSIS — R1013 Epigastric pain: Secondary | ICD-10-CM | POA: Insufficient documentation

## 2017-02-27 DIAGNOSIS — R739 Hyperglycemia, unspecified: Secondary | ICD-10-CM | POA: Insufficient documentation

## 2017-02-27 DIAGNOSIS — R1084 Generalized abdominal pain: Secondary | ICD-10-CM | POA: Insufficient documentation

## 2017-02-27 LAB — POCT URINALYSIS DIPSTICK
Bilirubin, UA: NEGATIVE
Glucose, UA: NEGATIVE
KETONES UA: NEGATIVE
Leukocytes, UA: NEGATIVE
Nitrite, UA: NEGATIVE
PROTEIN UA: NEGATIVE
SPEC GRAV UA: 1.02 (ref 1.010–1.025)
Urobilinogen, UA: 0.2 E.U./dL
pH, UA: 5.5 (ref 5.0–8.0)

## 2017-02-27 LAB — GLUCOSE, POCT (MANUAL RESULT ENTRY): POC Glucose: 97 mg/dl (ref 70–99)

## 2017-02-27 LAB — POCT GLYCOSYLATED HEMOGLOBIN (HGB A1C): Hemoglobin A1C: 6.2

## 2017-02-27 MED ORDER — OMEPRAZOLE 20 MG PO CPDR: 20.0000 mg | DELAYED_RELEASE_CAPSULE | Freq: Every day | ORAL | 3 refills | Status: DC

## 2017-02-27 NOTE — Progress Notes (Signed)
Patient ID: Candice [name], female   DOB: 06-29-1976, 41 y.o.   MRN: [id number]   Candice [name], is a 41 y.o. female  WGN:[id number]:[id number]  HQI:[id number]  DOB - 06-29-1976  Subjective:  Chief Complaint and HPI: Candice [name] is a 41 y.o. female here today for generalized mid-epigastric/abdominal pain.  She has been having this pain for about 1 yr.  [place]ame [place] from [place] about 1 month ago.  Not sexually active in a while bc she and her husband have been in separate countries. Pain is worse after meals and at bedtime.  Pain can last for hours at a time.  No N/V.  She does have occasional loose BM.  No melena/hematochezia.  No f/c.  Appetite is normal.  [employer] used.  ROS:   Constitutional:  No f/c, No night sweats, No unexplained weight loss. EENT:  No vision changes, No blurry vision, No hearing changes. No mouth, throat, or ear problems.  Respiratory: No cough, No SOB Cardiac: No CP, no palpitations GI:  + abd pain, No N/V.  +occasional loose BM GU: No Urinary s/sx Musculoskeletal: No joint pain Neuro: No headache, no dizziness, no motor weakness.  Skin: No rash Endocrine:  No polydipsia. No polyuria.  Psych: Denies SI/HI  No problems updated.  ALLERGIES: No Known Allergies  PAST MEDICAL HISTORY: No past medical history on file.  MEDICATIONS AT HOME: Prior to Admission medications   Medication Sig Start Date End Date Taking? Authorizing Provider  DHEA 50 MG CAPS Take 1 capsule by mouth. Reported on 04/06/2016    [provider]  dicyclomine (BENTYL) 20 MG tablet Take 1 tablet (20 mg total) by mouth 3 (three) times daily before meals. Patient not taking: Reported on 02/27/2017 06/05/16   Dessa PhiFunches, Josalyn, MD  omeprazole (PRILOSEC) 20 MG capsule Take 1 capsule (20 mg total) by mouth daily. 02/27/17   Anders SimmondsMcClung, Lileigh Fahringer M, PA-C     Objective:  EXAM:   Vitals:   02/27/17 1537  BP: 105/78  Pulse: 95  Resp: 18  Temp: 98.3 F (36.8 C)  TempSrc: Oral    SpO2: 99%  Weight: 206 lb 3.2 oz (93.5 kg)  Height: 5\' 2"  (1.575 m)    General appearance : A&OX3. NAD. Non-toxic-appearing HEENT: Atraumatic and Normocephalic.  PERRLA. EOM intact.   Neck: supple, no JVD. No cervical lymphadenopathy. No thyromegaly Chest/Lungs:  Breathing-non-labored, Good air entry bilaterally, breath sounds normal without rales, rhonchi, or wheezing  CVS: S1 S2 regular, no murmurs, gallops, rubs  Abdomen: Bowel sounds present, Non tender and not distended with no gaurding, rigidity or rebound. Extremities: Bilateral Lower Ext shows no edema, both legs are warm to touch with = pulse throughout Neurology:  CN II-XII grossly intact, Non focal.   Psych:  TP linear. J/I WNL. Normal speech. Appropriate eye contact and affect.  Skin:  No Rash  Data Review Lab Results  Component Value Date   HGBA1C 6.2 02/27/2017   HGBA1C 6.30 10/28/2015     Assessment & Plan   1. Hyperglycemia Not on meds. - Glucose (CBG) - HgB A1c I have had a lengthy discussion and provided education about insulin resistance and the intake of too much sugar/refined carbohydrates.  I have advised the patient to work at a goal of eliminating sugary drinks, candy, desserts, sweets, refined sugars, processed foods, and white carbohydrates.  The patient expresses understanding.    2. Generalized abdominal pain Ulcer vs gallbladder disease vs other - Comprehensive metabolic panel - CBC with  Differential/Platelet - H. pylori breath test - omeprazole (PRILOSEC) 20 MG capsule; Take 1 capsule (20 mg total) by mouth daily.  Dispense: 30 capsule; Refill: 3 - POCT urinalysis dipstick     Patient have been counseled extensively about nutrition and exercise  Return in about 3 weeks (around 03/20/2017) for assign new PCP; recheck abd pain/?gallbladder disease.  The patient was given clear instructions to go to ER or return to medical center if symptoms don't improve, worsen or new problems develop. The  patient verbalized understanding. The patient was told to call to get lab results if they haven't heard anything in the next week.     Georgian Co, PA-C [facility] [place], [place] [phone]   02/27/2017, 4:07 PM

## 2017-02-27 NOTE — Patient Instructions (Addendum)
Peptic Ulcer °A peptic ulcer is a painful sore in the lining of your esophagus, stomach, or the first part of your small intestine. You may have pain in the area between your chest and your belly button. The most common causes of an ulcer are: °An infection. °Using certain pain medicines too often or too much. ° °Follow these instructions at home: °Avoid alcohol. °Avoid caffeine. °Do not use any tobacco products. These include cigarettes, chewing tobacco, and e-cigarettes. If you need help quitting, ask your doctor. °Take over-the-counter and prescription medicines only as told by your doctor. Do not stop or change your medicines unless you talk with your doctor about it first. °Keep all follow-up visits as told by your doctor. This is important. °Contact a doctor if: °You do not get better in 7 days after you start treatment. °You keep having an upset stomach (indigestion) or heartburn. °Get help right away if: °You have sudden, sharp pain in your belly (abdomen). °You have lasting belly pain. °You have bloody poop (stool) or black, tarry poop. °You throw up (vomit) blood. It may look like coffee grounds. °You feel light-headed or feel like you may pass out (faint). °You get weak. °You get sweaty or feel sticky and cold to the touch (clammy). °This information is not intended to replace advice given to you by your health care provider. Make sure you discuss any questions you have with your health care provider. °Document Released: 11/29/2009 Document Revised: 01/19/2016 Document Reviewed: 06/05/2015 °Elsevier Interactive Patient Education © 2018 Elsevier Inc. ° ° °Cholelithiasis °Cholelithiasis is also called "gallstones." It is a kind of gallbladder disease. The gallbladder is an organ that stores a liquid (bile) that helps you digest fat. Gallstones may not cause symptoms (may be silent gallstones) until they cause a blockage, and then they can cause pain (gallbladder attack). °Follow these instructions at  home: °Take over-the-counter and prescription medicines only as told by your doctor. °Stay at a healthy weight. °Eat healthy foods. This includes: °Eating fewer fatty foods, like fried foods. °Eating fewer refined carbs (refined carbohydrates). Refined carbs are breads and grains that are highly processed, like white bread and white rice. Instead, choose whole grains like whole-wheat bread and brown rice. °Eating more fiber. Almonds, fresh fruit, and beans are healthy sources of fiber. °Keep all follow-up visits as told by your doctor. This is important. °Contact a doctor if: °You have sudden pain in the upper right side of your belly (abdomen). Pain might spread to your right shoulder or your chest. This may be a sign of a gallbladder attack. °You feel sick to your stomach (are nauseous). °You throw up (vomit). °You have been diagnosed with gallstones that have no symptoms and you get: °Belly pain. °Discomfort, burning, or fullness in the upper part of your belly (indigestion). °Get help right away if: °You have sudden pain in the upper right side of your belly, and it lasts for more than 2 hours. °You have belly pain that lasts for more than 5 hours. °You have a fever or chills. °You keep feeling sick to your stomach or you keep throwing up. °Your skin or the whites of your eyes turn yellow (jaundice). °You have dark-colored pee (urine). °You have light-colored poop (stool). °Summary °Cholelithiasis is also called "gallstones." °The gallbladder is an organ that stores a liquid (bile) that helps you digest fat. °Silent gallstones are gallstones that do not cause symptoms. °A gallbladder attack may cause sudden pain in the upper right side of your   belly. Pain might spread to your right shoulder or your chest. If this happens, contact your doctor. °If you have sudden pain in the upper right side of your belly that lasts for more than 2 hours, get help right away. °This information is not intended to replace advice  given to you by your health care provider. Make sure you discuss any questions you have with your health care provider. °Document Released: 02/21/2008 Document Revised: 05/21/2016 Document Reviewed: 05/21/2016 °Elsevier Interactive Patient Education © 2017 Elsevier Inc. ° °

## 2017-02-28 LAB — CBC WITH DIFFERENTIAL/PLATELET
BASOS ABS: 0 10*3/uL (ref 0.0–0.2)
Basos: 0 %
EOS (ABSOLUTE): 0.3 10*3/uL (ref 0.0–0.4)
Eos: 4 %
HEMOGLOBIN: 13.3 g/dL (ref 11.1–15.9)
Hematocrit: 41.5 % (ref 34.0–46.6)
IMMATURE GRANS (ABS): 0 10*3/uL (ref 0.0–0.1)
Immature Granulocytes: 0 %
LYMPHS: 30 %
Lymphocytes Absolute: 2.5 10*3/uL (ref 0.7–3.1)
MCH: 26.3 pg — AB (ref 26.6–33.0)
MCHC: 32 g/dL (ref 31.5–35.7)
MCV: 82 fL (ref 79–97)
MONOCYTES: 5 %
Monocytes Absolute: 0.4 10*3/uL (ref 0.1–0.9)
NEUTROS ABS: 4.9 10*3/uL (ref 1.4–7.0)
NEUTROS PCT: 61 %
PLATELETS: 359 10*3/uL (ref 150–379)
RBC: 5.06 x10E6/uL (ref 3.77–5.28)
RDW: 14.2 % (ref 12.3–15.4)
WBC: 8.1 10*3/uL (ref 3.4–10.8)

## 2017-02-28 LAB — COMPREHENSIVE METABOLIC PANEL
ALT: 20 IU/L (ref 0–32)
AST: 19 IU/L (ref 0–40)
Albumin/Globulin Ratio: 1.1 — ABNORMAL LOW (ref 1.2–2.2)
Albumin: 3.9 g/dL (ref 3.5–5.5)
Alkaline Phosphatase: 125 IU/L — ABNORMAL HIGH (ref 39–117)
BUN/Creatinine Ratio: 13 (ref 9–23)
BUN: 9 mg/dL (ref 6–20)
CALCIUM: 9.6 mg/dL (ref 8.7–10.2)
CHLORIDE: 98 mmol/L (ref 96–106)
CO2: 27 mmol/L (ref 20–29)
Creatinine, Ser: 0.67 mg/dL (ref 0.57–1.00)
GFR, EST AFRICAN AMERICAN: 129 mL/min/{1.73_m2} (ref >59)
GFR, EST NON AFRICAN AMERICAN: 112 mL/min/{1.73_m2} (ref >59)
GLUCOSE: 99 mg/dL (ref 65–99)
Globulin, Total: 3.5 g/dL (ref 1.5–4.5)
POTASSIUM: 4.6 mmol/L (ref 3.5–5.2)
Sodium: 139 mmol/L (ref 134–144)
TOTAL PROTEIN: 7.4 g/dL (ref 6.0–8.5)

## 2017-02-28 LAB — H. PYLORI BREATH TEST: H PYLORI BREATH TEST: POSITIVE — AB

## 2017-02-28 MED FILL — ?OMEPRAZOLE DR 20 MG CAPSUL: 20 | 30 days supply | Qty: 30 | Fill #0

## 2017-03-02 ENCOUNTER — Other Ambulatory Visit: Payer: Self-pay | Admitting: Physician Assistant

## 2017-03-02 ENCOUNTER — Telehealth: Payer: Self-pay | Admitting: *Deleted

## 2017-03-02 DIAGNOSIS — R1084 Generalized abdominal pain: Secondary | ICD-10-CM

## 2017-03-02 MED ORDER — CLARITHROMYCIN 500 MG PO TABS: 500.0000 mg | ORAL_TABLET | Freq: Two times a day (BID) | ORAL | 0 refills | Status: DC

## 2017-03-02 MED ORDER — OMEPRAZOLE 20 MG PO CPDR: 20.0000 mg | DELAYED_RELEASE_CAPSULE | Freq: Two times a day (BID) | ORAL | 0 refills | Status: DC

## 2017-03-02 MED ORDER — AMOXICILLIN 500 MG PO CAPS: 1000.0000 mg | ORAL_CAPSULE | Freq: Two times a day (BID) | ORAL | 0 refills | Status: DC

## 2017-03-02 NOTE — Telephone Encounter (Signed)
-----   Message from Anders SimmondsAngela M McClung, [place]-C sent at 03/02/2017  9:31 AM EDT ----- Please call patient.  Her labs were normal except that she tested positive for the bacteria that causes stomach ulcers.  I have sent her 2 antibiotics to take for the next 10 days.  I also sent her a new prescription for omeprazole to take twice daily for 1 month.  These 3 medications will work together to get rid of the infection and help her abdominal problem.  Thanks, Georgian CoAngela McClung, PA-C

## 2017-03-02 NOTE — Telephone Encounter (Signed)
Patient verified DOB Patient is aware of bacteria for stomach ulcer being present and 3 prescriptions being prescribed to the [facility]. Patient expressed her understanding and had no further questions.

## 2017-04-02 MED FILL — VIT D2 1.25 MG (50,000 UNIT: 1.25 MG | 84 days supply | Qty: 12 | Fill #1

## 2017-04-16 ENCOUNTER — Ambulatory Visit: Payer: Self-pay | Attending: Family Medicine | Admitting: Family Medicine

## 2017-04-16 ENCOUNTER — Encounter: Payer: Self-pay | Admitting: Family Medicine

## 2017-04-16 VITALS — BP 95/61 | HR 102 | Temp 98.4°F | Resp 18 | Ht 61.0 in | Wt 209.4 lb

## 2017-04-16 DIAGNOSIS — Z79899 Other long term (current) drug therapy: Secondary | ICD-10-CM | POA: Insufficient documentation

## 2017-04-16 DIAGNOSIS — M545 Low back pain, unspecified: Secondary | ICD-10-CM

## 2017-04-16 DIAGNOSIS — B9681 Helicobacter pylori [H. pylori] as the cause of diseases classified elsewhere: Secondary | ICD-10-CM | POA: Insufficient documentation

## 2017-04-16 DIAGNOSIS — R300 Dysuria: Secondary | ICD-10-CM | POA: Insufficient documentation

## 2017-04-16 DIAGNOSIS — Z23 Encounter for immunization: Secondary | ICD-10-CM | POA: Insufficient documentation

## 2017-04-16 DIAGNOSIS — Z0000 Encounter for general adult medical examination without abnormal findings: Secondary | ICD-10-CM

## 2017-04-16 DIAGNOSIS — R3129 Other microscopic hematuria: Secondary | ICD-10-CM | POA: Insufficient documentation

## 2017-04-16 DIAGNOSIS — L659 Nonscarring hair loss, unspecified: Secondary | ICD-10-CM | POA: Insufficient documentation

## 2017-04-16 DIAGNOSIS — Z8619 Personal history of other infectious and parasitic diseases: Secondary | ICD-10-CM

## 2017-04-16 DIAGNOSIS — L603 Nail dystrophy: Secondary | ICD-10-CM | POA: Insufficient documentation

## 2017-04-16 DIAGNOSIS — Z8639 Personal history of other endocrine, nutritional and metabolic disease: Secondary | ICD-10-CM

## 2017-04-16 DIAGNOSIS — E559 Vitamin D deficiency, unspecified: Secondary | ICD-10-CM | POA: Insufficient documentation

## 2017-04-16 DIAGNOSIS — Z87442 Personal history of urinary calculi: Secondary | ICD-10-CM | POA: Insufficient documentation

## 2017-04-16 LAB — POCT URINALYSIS DIPSTICK
Bilirubin, UA: NEGATIVE
Glucose, UA: NEGATIVE
KETONES UA: NEGATIVE
LEUKOCYTES UA: NEGATIVE
NITRITE UA: NEGATIVE
PH UA: 5 (ref 5.0–8.0)
PROTEIN UA: NEGATIVE
Spec Grav, UA: 1.02 (ref 1.010–1.025)
UROBILINOGEN UA: 0.2 U/dL

## 2017-04-16 MED ORDER — ACETAMINOPHEN 500 MG PO TABS: 500.0000 mg | ORAL_TABLET | Freq: Four times a day (QID) | ORAL | 0 refills | Status: DC | PRN

## 2017-04-16 MED ORDER — BIOTIN 5 MG PO TABS: 1.0000 | ORAL_TABLET | Freq: Every day | ORAL | 0 refills | Status: DC

## 2017-04-16 MED ORDER — ONE-A-DAY WOMENS FORMULA PO TABS: 1.0000 | ORAL_TABLET | Freq: Every day | ORAL | 0 refills | Status: DC

## 2017-04-16 NOTE — Progress Notes (Signed)
Patient is here for stomach pain that comes & goes   Patient requesting vitamin d check   Patient complains about palm hand being always hot

## 2017-04-16 NOTE — Progress Notes (Signed)
Subjective:  Patient ID: Candice [name], female    DOB: [birth year]  Age: [age] y.o. MRN: [id number]  CC: Establish Care  Interpreter services: [id number]  HPI Candice [name] presents for complains of dark yellow urine, abnormal smelling urine, burning with urination, dysuria and frequency She has had symptoms for 3 weeks. Patient also complains bilateral low  back pain. Patient denies fever and vaginal discharge. Patient does not have a history of recurrent UTI.  Patient does not have a history of pyelonephritis. She does reports history of nephrolithiasis 4 [place] ago in [place]. She also c/o hair shedding for 1 year and brittle nails. Denies any easy bruising or bleeding, fatigue, weight loss, or nail discoloration. History of vitamin d deficiency she reports not completing course of treatment. History of h.pylori infection she report completing therapy course. Denies any melena or epigastric pain.  Outpatient Medications Prior to Visit  Medication Sig Dispense Refill  . amoxicillin (AMOXIL) 500 MG capsule Take 2 capsules (1,000 mg total) by mouth 2 (two) times daily. 40 capsule 0  . clarithromycin (BIAXIN) 500 MG tablet Take 1 tablet (500 mg total) by mouth 2 (two) times daily. 20 tablet 0  . DHEA 50 MG CAPS Take 1 capsule by mouth. Reported on 04/06/2016    . dicyclomine (BENTYL) 20 MG tablet Take 1 tablet (20 mg total) by mouth 3 (three) times daily before meals. (Patient not taking: Reported on 02/27/2017) 90 tablet 1  . omeprazole (PRILOSEC) 20 MG capsule Take 1 capsule (20 mg total) by mouth 2 (two) times daily before a meal. 60 capsule 0   No facility-administered medications prior to visit.     ROS Review of Systems  Constitutional: Negative.   Respiratory: Negative.   Cardiovascular: Negative.   Gastrointestinal: Negative.   Genitourinary: Positive for dysuria, frequency and pelvic pain.  Skin:       Hair shedding Brittle nails   Objective:  BP 95/61 (BP Location: Left Arm,  Patient Position: Sitting, Cuff Size: Normal)   Pulse (!) 102   Temp 98.4 F (36.9 C) (Oral)   Resp 18   Ht 5\' 1"  (1.549 m)   Wt 209 lb 6.4 oz (95 kg)   SpO2 99%   BMI 39.57 kg/m   BP/Weight 04/16/2017 02/27/2017 06/05/2016  Systolic BP 95 105 125  Diastolic BP 61 78 96  Wt. (Lbs) 209.4 206.2 207.6  BMI 39.57 37.71 41.93     Physical Exam  Constitutional: She appears well-developed and well-nourished.  HENT:  Head: Normocephalic and atraumatic.  Right Ear: External ear normal.  Left Ear: External ear normal.  Nose: Nose normal.  Mouth/Throat: Oropharynx is clear and moist.  Eyes: Pupils are equal, round, and reactive to light. Conjunctivae are normal.  Neck: Normal range of motion. Neck supple. No JVD present.  Cardiovascular: Normal rate, regular rhythm, normal heart sounds and intact distal pulses.   Pulmonary/Chest: Effort normal and breath sounds normal.  Abdominal: Soft. Bowel sounds are normal. There is no tenderness.  Musculoskeletal:       Lumbar back: She exhibits pain.  Skin: Skin is warm and dry. No rash noted.  Nails: Brittle,peeling nails. No thickening or discoloration present.  Hair: Negative pull test, no scaling, or patchy loss of hair present.  Nursing note and vitals reviewed.   Assessment & Plan:   Problem List Items Addressed This Visit    None    Visit Diagnoses    Dysuria    -  Primary  Relevant Orders   Urinalysis Dipstick (Completed)   DG Abd 1 View   Urine cytology ancillary only (Completed)   Hematuria, microscopic       Found on urine dip, history of nephrolithiasis will order imaging to rule out possible nephrolithiasis.    Relevant Orders   DG Abd 1 View   Basic metabolic panel (Completed)   Hair loss       Relevant Medications   Multiple Vitamins-Calcium (ONE-A-DAY WOMENS FORMULA) TABS   Biotin 5 MG TABS   Other Relevant Orders   TSH (Completed)   CBC (Completed)   Brittle nails       Relevant Medications   Multiple  Vitamins-Calcium (ONE-A-DAY WOMENS FORMULA) TABS   Biotin 5 MG TABS   Other Relevant Orders   TSH (Completed)   CBC (Completed)   History of Helicobacter pylori infection       Follow up screening to evaluate irradiation.    Relevant Orders   H. pylori breath test (Completed)   History of vitamin D deficiency       Relevant Orders   Vitamin D, 25-hydroxy   Health care maintenance       Relevant Orders   Tdap vaccine greater than or equal to 7yo IM (Completed)   Personal history of kidney stones       Relevant Orders   DG Abd 1 View   Acute bilateral low back pain without sciatica       Relevant Medications   acetaminophen (TYLENOL) 500 MG tablet      Meds ordered this encounter  Medications  . Multiple Vitamins-Calcium (ONE-A-DAY WOMENS FORMULA) TABS    Sig: Take 1 tablet by mouth daily.    Refill:  0    Order Specific Question:   Supervising Provider    Answer:   Quentin AngstJEGEDE, OLUGBEMIGA E [id number]  . Biotin 5 MG TABS    Sig: Take 1 tablet (5 mg total) by mouth daily.    Refill:  0    Order Specific Question:   Supervising Provider    Answer:   Quentin AngstJEGEDE, OLUGBEMIGA E [id number]  . acetaminophen (TYLENOL) 500 MG tablet    Sig: Take 1 tablet (500 mg total) by mouth every 6 (six) hours as needed.    Dispense:  30 tablet    Refill:  0    Order Specific Question:   Supervising Provider    Answer:   Quentin AngstJEGEDE, OLUGBEMIGA E [id number]    Follow-up: Return if symptoms worsen or fail to improve.   Candice BarkMandesia R Hairston FNP

## 2017-04-17 LAB — H. PYLORI BREATH TEST: H pylori Breath Test: POSITIVE — AB

## 2017-04-19 LAB — URINE CYTOLOGY ANCILLARY ONLY
Bacterial vaginitis: NEGATIVE
Candida vaginitis: POSITIVE — AB

## 2017-04-20 ENCOUNTER — Ambulatory Visit: Payer: Self-pay | Attending: Family Medicine

## 2017-04-20 ENCOUNTER — Other Ambulatory Visit: Payer: Self-pay

## 2017-04-20 DIAGNOSIS — L659 Nonscarring hair loss, unspecified: Secondary | ICD-10-CM

## 2017-04-20 DIAGNOSIS — R3129 Other microscopic hematuria: Secondary | ICD-10-CM

## 2017-04-20 DIAGNOSIS — L603 Nail dystrophy: Secondary | ICD-10-CM

## 2017-04-21 LAB — BASIC METABOLIC PANEL
BUN / CREAT RATIO: 18 (ref 9–23)
BUN: 12 mg/dL (ref 6–20)
CO2: 26 mmol/L (ref 20–29)
CREATININE: 0.65 mg/dL (ref 0.57–1.00)
Calcium: 9.3 mg/dL (ref 8.7–10.2)
Chloride: 102 mmol/L (ref 96–106)
GFR, EST AFRICAN AMERICAN: 130 mL/min/{1.73_m2} (ref >59)
GFR, EST NON AFRICAN AMERICAN: 113 mL/min/{1.73_m2} (ref >59)
Glucose: 98 mg/dL (ref 65–99)
POTASSIUM: 4.3 mmol/L (ref 3.5–5.2)
SODIUM: 139 mmol/L (ref 134–144)

## 2017-04-21 LAB — CBC
HEMATOCRIT: 37.7 % (ref 34.0–46.6)
HEMOGLOBIN: 12.1 g/dL (ref 11.1–15.9)
MCH: 25.7 pg — AB (ref 26.6–33.0)
MCHC: 32.1 g/dL (ref 31.5–35.7)
MCV: 80 fL (ref 79–97)
Platelets: 375 10*3/uL (ref 150–379)
RBC: 4.7 x10E6/uL (ref 3.77–5.28)
RDW: 14.3 % (ref 12.3–15.4)
WBC: 8.1 10*3/uL (ref 3.4–10.8)

## 2017-04-21 LAB — TSH: TSH: 1.76 u[IU]/mL (ref 0.450–4.500)

## 2017-04-25 ENCOUNTER — Other Ambulatory Visit: Payer: Self-pay | Admitting: Family Medicine

## 2017-04-25 DIAGNOSIS — B373 Candidiasis of vulva and vagina: Secondary | ICD-10-CM

## 2017-04-25 DIAGNOSIS — A048 Other specified bacterial intestinal infections: Secondary | ICD-10-CM

## 2017-04-25 DIAGNOSIS — B3731 Acute candidiasis of vulva and vagina: Secondary | ICD-10-CM

## 2017-04-25 MED ORDER — OMEPRAZOLE 20 MG PO CPDR: 20.0000 mg | DELAYED_RELEASE_CAPSULE | Freq: Two times a day (BID) | ORAL | 0 refills | Status: DC

## 2017-04-25 MED ORDER — AMOXICILLIN 500 MG PO TABS: 1000.0000 mg | ORAL_TABLET | Freq: Two times a day (BID) | ORAL | 0 refills | Status: DC

## 2017-04-25 MED ORDER — CLARITHROMYCIN 500 MG PO TABS: 500.0000 mg | ORAL_TABLET | Freq: Two times a day (BID) | ORAL | 0 refills | Status: DC

## 2017-04-25 MED ORDER — FLUCONAZOLE 150 MG PO TABS: 150.0000 mg | ORAL_TABLET | Freq: Once | ORAL | 0 refills | Status: AC

## 2017-04-25 MED FILL — CLARITHROMYCIN 500 MG TAB: 500 | 14 days supply | Qty: 28 | Fill #0

## 2017-04-25 MED FILL — ?OMEPRAZOLE DR 20 MG CAPSUL: 20 | 30 days supply | Qty: 60 | Fill #0

## 2017-04-25 MED FILL — FLUCONAZOLE 150 MG TABLET: 150 | 1 days supply | Qty: 1 | Fill #0

## 2017-04-25 MED FILL — AMOXICILLIN 500 MG CAPSULE: 500 | 14 days supply | Qty: 56 | Fill #0

## 2017-04-30 ENCOUNTER — Telehealth: Payer: Self-pay

## 2017-04-30 NOTE — Telephone Encounter (Signed)
CMA call regarding lab results  Patient did not answer but left a VM  Stating  the reason of the call & to call back

## 2017-04-30 NOTE — Telephone Encounter (Signed)
-----   Message from Lizbeth BarkMandesia R Hairston, [place]NP sent at 04/25/2017  2:31 PM EDT ----- -H.pylori is posiitve H.pylori is a bacteria that can infect the stomach and cause stomach ulcers. You will be prescribed medications to treat.  -Yeast was positive. You will be prescribed  To reduce your risk of developing BV don't douche, don't use scented soap or sprays, and use protection during sexual intercourse. -Bacterial vaginosis is negative

## 2017-04-30 NOTE — Telephone Encounter (Signed)
-----   Message from Lizbeth BarkMandesia R Hairston, [place]NP sent at 04/25/2017  2:33 PM EDT ----- Thyroid function normal when decreased it can cause hair loss and brittle nails. Kidney function normal Labs that evaluated your blood cells, fluid and electrolyte balance are normal. No signs of anemia, acute infection, or inflammation present. Anemia can cause hair loss.

## 2017-05-01 ENCOUNTER — Telehealth: Payer: Self-pay | Admitting: Family Medicine

## 2017-05-01 NOTE — Telephone Encounter (Signed)
Pt came to the office to request lab result, she need an interpreter, please call her back with one today if possible

## 2017-05-01 NOTE — Telephone Encounter (Signed)
Name & ID  Candice MessierKathy [id number]  CMA call regarding lab results   Patient needs interpreter   interpreter tried calling both numbers on file on [phone] left a VM on the other one patient answer   Patient was aware and understood

## 2017-05-23 ENCOUNTER — Ambulatory Visit: Payer: Self-pay | Attending: Family Medicine | Admitting: Physician Assistant

## 2017-05-23 ENCOUNTER — Encounter: Payer: Self-pay | Admitting: Physician Assistant

## 2017-05-23 VITALS — BP 122/85 | HR 93 | Temp 98.1°F | Resp 18 | Ht 61.0 in | Wt 208.6 lb

## 2017-05-23 DIAGNOSIS — B379 Candidiasis, unspecified: Secondary | ICD-10-CM | POA: Insufficient documentation

## 2017-05-23 DIAGNOSIS — L298 Other pruritus: Secondary | ICD-10-CM | POA: Insufficient documentation

## 2017-05-23 DIAGNOSIS — J302 Other seasonal allergic rhinitis: Secondary | ICD-10-CM | POA: Insufficient documentation

## 2017-05-23 MED ORDER — CETIRIZINE HCL 10 MG PO TABS: 10.0000 mg | ORAL_TABLET | Freq: Every day | ORAL | 11 refills | Status: DC

## 2017-05-23 MED ORDER — AZELASTINE HCL 0.05 % OP SOLN: 1.0000 [drp] | Freq: Two times a day (BID) | OPHTHALMIC | 12 refills | Status: DC

## 2017-05-23 MED FILL — ?CETIRIZINE HCL 10 MG TABLE: 10 | 30 days supply | Qty: 30 | Fill #0

## 2017-05-23 MED FILL — AZELASTINE HCL 0.05% DROPS: 0.05 | 22 days supply | Qty: 6 | Fill #0

## 2017-05-23 NOTE — Progress Notes (Signed)
Patient ID: Candice [name], female   DOB: Mar 09, 1976, 41 y.o.   MRN: [id number]     Candice [name], is a 41 y.o. female  [id number]  DOB - Mar 09, 1976  Subjective:  Chief Complaint and HPI: Candice [name] is a 41 y.o. female here today for Itching eyes, ears, and throat.  No f/c.  No cough.  Symptoms X 2 weeks.  Also wants to recheck from having a yeast infection from the last time she was here.  She denies any vaginal or urinary s/sx.  Azad [id number]  ROS:   Constitutional:  No f/c, No night sweats, No unexplained weight loss. EENT:  No vision changes, No blurry vision, No hearing changes. +nasal and ear congestion Respiratory: No cough, No SOB Cardiac: No CP, no palpitations GI:  No abd pain, No N/V/D. GU: No Urinary s/sx Musculoskeletal: No joint pain Neuro: No headache, no dizziness, no motor weakness.  Skin: No rash Endocrine:  No polydipsia. No polyuria.  Psych: Denies SI/HI  No problems updated.  ALLERGIES: No Known Allergies  PAST MEDICAL HISTORY: History reviewed. No pertinent past medical history.  MEDICATIONS AT HOME: Prior to Admission medications   Medication Sig Start Date End Date Taking? Authorizing Provider  omeprazole (PRILOSEC) 20 MG capsule Take 1 capsule (20 mg total) by mouth 2 (two) times daily before a meal. 04/25/17  Yes Hairston, Mandesia R, FNP  acetaminophen (TYLENOL) 500 MG tablet Take 1 tablet (500 mg total) by mouth every 6 (six) hours as needed. 04/16/17   Lizbeth BarkHairston, Mandesia R, FNP  azelastine (OPTIVAR) 0.05 % ophthalmic solution Place 1 drop into both eyes 2 (two) times daily. 05/23/17   Anders SimmondsMcClung, Johntay Doolen M, PA-C  Biotin 5 MG TABS Take 1 tablet (5 mg total) by mouth daily. 04/16/17   Lizbeth BarkHairston, Mandesia R, FNP  cetirizine (ZYRTEC) 10 MG tablet Take 1 tablet (10 mg total) by mouth daily. 05/23/17   Anders SimmondsMcClung, Denasia Venn M, PA-C  DHEA 50 MG CAPS Take 1 capsule by mouth. Reported on 04/06/2016    [provider]  dicyclomine (BENTYL)  20 MG tablet Take 1 tablet (20 mg total) by mouth 3 (three) times daily before meals. Patient not taking: Reported on 02/27/2017 06/05/16   Dessa PhiFunches, Josalyn, MD  Multiple Vitamins-Calcium (ONE-A-DAY WOMENS FORMULA) TABS Take 1 tablet by mouth daily. 04/16/17   Lizbeth BarkHairston, Mandesia R, FNP     Objective:  EXAM:   Vitals:   05/23/17 1145  BP: 122/85  Pulse: 93  Resp: 18  Temp: 98.1 F (36.7 C)  TempSrc: Oral  SpO2: 98%  Weight: 208 lb 9.6 oz (94.6 kg)  Height: 5\' 1"  (1.549 m)    General appearance : A&OX3. NAD. Non-toxic-appearing HEENT: Atraumatic and Normocephalic.  PERRLA. B conjunctivae with pale erythema and mild chemosis on L.  No discharge. EOM intact.  TM clear full B; no infection. Mouth-MMM, post pharynx WNL w/o erythema, No PND.  Nose with pale and boggy turbinates.   Neck: supple, no JVD. No cervical lymphadenopathy. No thyromegaly Chest/Lungs:  Breathing-non-labored, Good air entry bilaterally, breath sounds normal without rales, rhonchi, or wheezing  CVS: S1 S2 regular, no murmurs, gallops, rubs  Extremities: Bilateral Lower Ext shows no edema, both legs are warm to touch with = pulse throughout Neurology:  CN II-XII grossly intact, Non focal.   Psych:  TP linear. J/I WNL. Normal speech. Appropriate eye contact and affect.  Skin:  No Rash  Data Review Lab Results  Component Value Date   HGBA1C 6.2  02/27/2017   HGBA1C 6.30 10/28/2015     Assessment & Plan   1. Seasonal allergic rhinitis, unspecified trigger - cetirizine (ZYRTEC) 10 MG tablet; Take 1 tablet (10 mg total) by mouth daily.  Dispense: 30 tablet; Refill: 11 - azelastine (OPTIVAR) 0.05 % ophthalmic solution; Place 1 drop into both eyes 2 (two) times daily.  Dispense: 6 mL; Refill: 12  2. Yeast infection She wants to re-test and see if this is gone-no symptoms - Urine cytology ancillary only   Patient have been counseled extensively about nutrition and exercise  Return if symptoms worsen or fail to  improve.  The patient was given clear instructions to go to ER or return to medical center if symptoms don't improve, worsen or new problems develop. The patient verbalized understanding. The patient was told to call to get lab results if they haven't heard anything in the next week.     Georgian Co, PA-C [facility] and [facility] [place], [place] [phone]   05/23/2017, 11:55 AM

## 2017-05-24 LAB — URINE CYTOLOGY ANCILLARY ONLY
Chlamydia: NEGATIVE
NEISSERIA GONORRHEA: NEGATIVE
TRICH (WINDOWPATH): NEGATIVE

## 2017-05-26 LAB — URINE CYTOLOGY ANCILLARY ONLY
BACTERIAL VAGINITIS: NEGATIVE
Candida vaginitis: POSITIVE — AB

## 2017-05-28 ENCOUNTER — Other Ambulatory Visit: Payer: Self-pay | Admitting: Physician Assistant

## 2017-05-28 MED ORDER — FLUCONAZOLE 150 MG PO TABS: 150.0000 mg | ORAL_TABLET | Freq: Once | ORAL | 0 refills | Status: AC

## 2017-05-30 ENCOUNTER — Other Ambulatory Visit: Payer: Self-pay | Admitting: Physician Assistant

## 2017-05-30 ENCOUNTER — Telehealth: Payer: Self-pay | Admitting: *Deleted

## 2017-05-30 MED ORDER — FLUCONAZOLE 150 MG PO TABS: 150.0000 mg | ORAL_TABLET | Freq: Once | ORAL | 0 refills | Status: DC

## 2017-05-30 MED FILL — FLUCONAZOLE 150 MG TABLET: 150 | 2 days supply | Qty: 2 | Fill #0

## 2017-05-30 NOTE — Telephone Encounter (Signed)
Notes recorded by Anders SimmondsMcClung, Angela M, PA-C on 05/28/2017 at 9:42 AM EDT Please call and tell her that I received some more results that show she did in fact have a yeast infection. I will send her a prescription.  Pt name and DOB verified. Pt aware of results.   Was medication sent to pharmacy for yeast infection?

## 2017-05-30 NOTE — Telephone Encounter (Signed)
Yes, [facility].    Thanks, Georgian CoAngela McClung, PA-C

## 2017-06-18 ENCOUNTER — Other Ambulatory Visit: Payer: Self-pay | Admitting: Physician Assistant

## 2017-06-21 ENCOUNTER — Other Ambulatory Visit: Payer: Self-pay | Admitting: Family Medicine

## 2017-06-21 DIAGNOSIS — A048 Other specified bacterial intestinal infections: Secondary | ICD-10-CM

## 2017-06-21 MED FILL — ?OMEPRAZOLE DR 20 MG CAPSUL: 20 | 30 days supply | Qty: 60 | Fill #0

## 2017-07-06 ENCOUNTER — Ambulatory Visit: Payer: Self-pay | Attending: Family Medicine

## 2017-07-17 ENCOUNTER — Other Ambulatory Visit: Payer: Self-pay | Admitting: Family Medicine

## 2017-07-17 DIAGNOSIS — J302 Other seasonal allergic rhinitis: Secondary | ICD-10-CM

## 2017-07-17 MED ORDER — FLUTICASONE PROPIONATE 50 MCG/ACT NA SUSP: 2.0000 | Freq: Every day | NASAL | 6 refills | Status: DC

## 2017-08-03 ENCOUNTER — Encounter: Payer: Self-pay | Admitting: Family Medicine

## 2017-08-03 ENCOUNTER — Ambulatory Visit: Payer: Self-pay | Attending: Family Medicine | Admitting: Family Medicine

## 2017-08-03 VITALS — BP 106/72 | HR 97 | Temp 98.5°F | Resp 18 | Ht 61.0 in | Wt 207.8 lb

## 2017-08-03 DIAGNOSIS — E559 Vitamin D deficiency, unspecified: Secondary | ICD-10-CM | POA: Insufficient documentation

## 2017-08-03 DIAGNOSIS — G8929 Other chronic pain: Secondary | ICD-10-CM

## 2017-08-03 DIAGNOSIS — Z8639 Personal history of other endocrine, nutritional and metabolic disease: Secondary | ICD-10-CM

## 2017-08-03 DIAGNOSIS — Z79899 Other long term (current) drug therapy: Secondary | ICD-10-CM | POA: Insufficient documentation

## 2017-08-03 DIAGNOSIS — R109 Unspecified abdominal pain: Secondary | ICD-10-CM

## 2017-08-03 DIAGNOSIS — Z87440 Personal history of urinary (tract) infections: Secondary | ICD-10-CM

## 2017-08-03 DIAGNOSIS — R3121 Asymptomatic microscopic hematuria: Secondary | ICD-10-CM

## 2017-08-03 DIAGNOSIS — R1032 Left lower quadrant pain: Secondary | ICD-10-CM | POA: Insufficient documentation

## 2017-08-03 DIAGNOSIS — Z8619 Personal history of other infectious and parasitic diseases: Secondary | ICD-10-CM

## 2017-08-03 DIAGNOSIS — Z8719 Personal history of other diseases of the digestive system: Secondary | ICD-10-CM

## 2017-08-03 DIAGNOSIS — K219 Gastro-esophageal reflux disease without esophagitis: Secondary | ICD-10-CM | POA: Insufficient documentation

## 2017-08-03 DIAGNOSIS — Z87442 Personal history of urinary calculi: Secondary | ICD-10-CM | POA: Insufficient documentation

## 2017-08-03 DIAGNOSIS — R197 Diarrhea, unspecified: Secondary | ICD-10-CM | POA: Insufficient documentation

## 2017-08-03 DIAGNOSIS — Z23 Encounter for immunization: Secondary | ICD-10-CM

## 2017-08-03 LAB — POCT URINALYSIS DIPSTICK
BILIRUBIN UA: NEGATIVE
Glucose, UA: NEGATIVE
KETONES UA: NEGATIVE
LEUKOCYTES UA: NEGATIVE
Nitrite, UA: NEGATIVE
Protein, UA: NEGATIVE
SPEC GRAV UA: 1.015 (ref 1.010–1.025)
Urobilinogen, UA: 0.2 E.U./dL
pH, UA: 5.5 (ref 5.0–8.0)

## 2017-08-03 LAB — POCT URINE PREGNANCY: Preg Test, Ur: NEGATIVE

## 2017-08-03 MED ORDER — OMEPRAZOLE 20 MG PO CPDR: DELAYED_RELEASE_CAPSULE | ORAL | 3 refills | Status: DC

## 2017-08-03 MED ORDER — ACETAMINOPHEN 500 MG PO TABS: 1000.0000 mg | ORAL_TABLET | Freq: Four times a day (QID) | ORAL | 0 refills | Status: DC | PRN

## 2017-08-03 NOTE — Progress Notes (Signed)
Subjective:  Patient ID: Candice SiasHala Sortor, female    DOB: [birth year]  Age: 41 y.o. MRN: [id number]  CC: Follow-up    HPI Treasure Morado presents for follow up. Interpreter services used. She denies any dysuria or fevers. She does report a episode of blood tinged urine 1 month ago. She reports history of nephrolithiasis 4 years ago in [place]. History of vitamin d deficiency she reports completing course of treatment. Abdominal pain: The pain is described as stabbing, and is moderate to severe and intensity.  She reports generalized abdominal pain.  History of H. pylori infection she reports completing treatment course.  She reports improvement of symptoms of right-sided abdominal pain.  But reports left-sided lower abdominal pain still remains.  Onset was 6 months ago. Symptoms have been stable since. Aggravating factors:after eating meals.  Alleviating factors: none. Associated symptoms: occasional diarrhea. The patient denies anorexia, constipation, hematochezia, melena, nausea and vomiting.   Outpatient Medications Prior to Visit  Medication Sig Dispense Refill  . azelastine (OPTIVAR) 0.05 % ophthalmic solution Place 1 drop into both eyes 2 (two) times daily. 6 mL 12  . Biotin 5 MG TABS Take 1 tablet (5 mg total) by mouth daily.  0  . cetirizine (ZYRTEC) 10 MG tablet Take 1 tablet (10 mg total) by mouth daily. 30 tablet 11  . DHEA 50 MG CAPS Take 1 capsule by mouth. Reported on 04/06/2016    . fluconazole (DIFLUCAN) 150 MG tablet TAKE ONE TABLET BY MOUTH ONCE. MAY REPEAT DOXE IN 5 DAYS 2 tablet 0  . fluticasone (FLONASE) 50 MCG/ACT nasal spray Place 2 sprays into both nostrils daily. 16 g 6  . Multiple Vitamins-Calcium (ONE-A-DAY WOMENS FORMULA) TABS Take 1 tablet by mouth daily.  0  . acetaminophen (TYLENOL) 500 MG tablet Take 1 tablet (500 mg total) by mouth every 6 (six) hours as needed. 30 tablet 0  . dicyclomine (BENTYL) 20 MG tablet Take 1 tablet (20 mg total) by mouth 3 (three) times daily  before meals. (Patient not taking: Reported on 02/27/2017) 90 tablet 1  . omeprazole (PRILOSEC) 20 MG capsule TAKE 1 CAPSULE BY MOUTH 2 TIMES DAILY BEFORE A MEAL. 60 capsule 0   No facility-administered medications prior to visit.     ROS Review of Systems  Constitutional: Negative.   Respiratory: Negative.   Cardiovascular: Negative.   Gastrointestinal: Negative.   Skin: Negative.    Objective:  BP 106/72 (BP Location: Left Arm, Patient Position: Sitting, Cuff Size: Normal)   Pulse 97   Temp 98.5 F (36.9 C) (Oral)   Resp 18   Ht 5\' 1"  (1.549 m)   Wt 207 lb 12.8 oz (94.3 kg)   SpO2 95%   BMI 39.26 kg/m   BP/Weight 08/03/2017 05/23/2017 04/16/2017  Systolic BP 106 122 95  Diastolic BP 72 85 61  Wt. (Lbs) 207.8 208.6 209.4  BMI 39.26 39.41 39.57     Physical Exam  Constitutional: She appears well-developed and well-nourished.  HENT:  Head: Normocephalic and atraumatic.  Right Ear: External ear normal.  Left Ear: External ear normal.  Nose: Nose normal.  Mouth/Throat: Oropharynx is clear and moist.  Eyes: Conjunctivae are normal. Pupils are equal, round, and reactive to light.  Neck: Normal range of motion. Neck supple.  Cardiovascular: Normal rate, regular rhythm, normal heart sounds and intact distal pulses.  Pulmonary/Chest: Effort normal and breath sounds normal.  Abdominal: Soft. Bowel sounds are normal. There is tenderness (LLQ).  Skin: Skin is warm and dry.  Nursing note and vitals reviewed.   Assessment & Plan:   1. Chronic abdominal pain  - POCT urine pregnancy - Ambulatory referral to Gastroenterology - DG Abd 1 View; Future - CBC - Basic metabolic panel - acetaminophen (TYLENOL) 500 MG tablet; Take 2 tablets (1,000 mg total) every 6 (six) hours as needed by mouth for moderate pain.  Dispense: 30 tablet; Refill: 0  2. History of urinary tract infection  - Urinalysis Dipstick  3. History of Helicobacter pylori infection  - H. pylori breath  test  4. History of gastroesophageal reflux (GERD)  - omeprazole (PRILOSEC) 20 MG capsule; TAKE 1 CAPSULE BY MOUTH 2 TIMES DAILY BEFORE A MEAL.  Dispense: 60 capsule; Refill: 3  5. History of vitamin D deficiency  - Vitamin D, 25-hydroxy  6. Asymptomatic microscopic hematuria Will place order for urology referral. - Urinalysis Dipstick  7. Needs flu shot  - Flu Vaccine QUAD 6+ mos PF IM (Fluarix Quad PF)      Follow-up: Return if symptoms worsen or fail to improve.   Lizbeth BarkMandesia R Deisha Stull FNP

## 2017-08-03 NOTE — Progress Notes (Signed)
Patient is here for urinary infection

## 2017-08-04 ENCOUNTER — Other Ambulatory Visit: Payer: Self-pay | Admitting: Family Medicine

## 2017-08-04 DIAGNOSIS — R3121 Asymptomatic microscopic hematuria: Secondary | ICD-10-CM

## 2017-08-04 LAB — BASIC METABOLIC PANEL
BUN / CREAT RATIO: 27 — AB (ref 9–23)
BUN: 15 mg/dL (ref 6–20)
CO2: 25 mmol/L (ref 20–29)
CREATININE: 0.55 mg/dL — AB (ref 0.57–1.00)
Calcium: 9.8 mg/dL (ref 8.7–10.2)
Chloride: 99 mmol/L (ref 96–106)
GFR calc Af Amer: 138 mL/min/{1.73_m2} (ref >59)
GFR, EST NON AFRICAN AMERICAN: 119 mL/min/{1.73_m2} (ref >59)
GLUCOSE: 81 mg/dL (ref 65–99)
Potassium: 3.8 mmol/L (ref 3.5–5.2)
SODIUM: 139 mmol/L (ref 134–144)

## 2017-08-04 LAB — CBC
HEMATOCRIT: 39.5 % (ref 34.0–46.6)
HEMOGLOBIN: 12.5 g/dL (ref 11.1–15.9)
MCH: 24.5 pg — AB (ref 26.6–33.0)
MCHC: 31.6 g/dL (ref 31.5–35.7)
MCV: 78 fL — ABNORMAL LOW (ref 79–97)
Platelets: 387 10*3/uL — ABNORMAL HIGH (ref 150–379)
RBC: 5.1 x10E6/uL (ref 3.77–5.28)
RDW: 14.2 % (ref 12.3–15.4)
WBC: 7.2 10*3/uL (ref 3.4–10.8)

## 2017-08-04 LAB — VITAMIN D 25 HYDROXY (VIT D DEFICIENCY, FRACTURES): Vit D, 25-Hydroxy: 39.4 ng/mL (ref 30.0–100.0)

## 2017-08-06 LAB — H. PYLORI BREATH TEST: H pylori Breath Test: NEGATIVE

## 2017-08-15 ENCOUNTER — Telehealth: Payer: Self-pay | Admitting: Family Medicine

## 2017-08-15 NOTE — Telephone Encounter (Signed)
Pt came in to request stomach bacteria results. I tried to contact a nurse but not available. Please follow up

## 2017-08-21 ENCOUNTER — Telehealth: Payer: Self-pay

## 2017-08-21 NOTE — Telephone Encounter (Signed)
-----   Message from Lizbeth BarkMandesia R Hairston, FNP sent at 08/15/2017  7:06 AM EST ----- -H.pylori is negative. H.pylori is a bacteria that can infect the stomach and cause stomach ulcers. Vitamin D level is normal. Labs normal Kidney function normal.

## 2017-08-21 NOTE — Telephone Encounter (Signed)
CMA call regarding lab results   Patient was aware and understood  

## 2017-08-21 NOTE — Telephone Encounter (Signed)
CMA call regarding patient lab results   Patient Verify DOB   Patient was aware and understood

## 2017-09-21 ENCOUNTER — Emergency Department (HOSPITAL_COMMUNITY): Payer: BLUE CROSS/BLUE SHIELD

## 2017-09-21 ENCOUNTER — Emergency Department (HOSPITAL_COMMUNITY)
Admission: EM | Admit: 2017-09-21 | Discharge: 2017-09-22 | Disposition: A | Discharge status: Home / Self Care | Payer: BLUE CROSS/BLUE SHIELD | Attending: Emergency Medicine | Admitting: Emergency Medicine

## 2017-09-21 ENCOUNTER — Other Ambulatory Visit: Payer: Self-pay

## 2017-09-21 ENCOUNTER — Encounter (HOSPITAL_COMMUNITY): Payer: Self-pay | Admitting: *Deleted

## 2017-09-21 DIAGNOSIS — Z79899 Other long term (current) drug therapy: Secondary | ICD-10-CM | POA: Insufficient documentation

## 2017-09-21 DIAGNOSIS — Y939 Activity, unspecified: Secondary | ICD-10-CM | POA: Diagnosis not present

## 2017-09-21 DIAGNOSIS — S161XXA Strain of muscle, fascia and tendon at neck level, initial encounter: Secondary | ICD-10-CM | POA: Insufficient documentation

## 2017-09-21 DIAGNOSIS — Y999 Unspecified external cause status: Secondary | ICD-10-CM | POA: Diagnosis not present

## 2017-09-21 DIAGNOSIS — R1084 Generalized abdominal pain: Secondary | ICD-10-CM | POA: Diagnosis not present

## 2017-09-21 DIAGNOSIS — Y929 Unspecified place or not applicable: Secondary | ICD-10-CM | POA: Diagnosis not present

## 2017-09-21 DIAGNOSIS — R319 Hematuria, unspecified: Secondary | ICD-10-CM | POA: Diagnosis present

## 2017-09-21 HISTORY — DX: Irritable bowel syndrome, unspecified: K58.9

## 2017-09-21 LAB — COMPREHENSIVE METABOLIC PANEL
ALT: 17 U/L (ref 14–54)
ANION GAP: 9 (ref 5–15)
AST: 21 U/L (ref 15–41)
Albumin: 3.2 g/dL — ABNORMAL LOW (ref 3.5–5.0)
Alkaline Phosphatase: 114 U/L (ref 38–126)
BUN: 12 mg/dL (ref 6–20)
CHLORIDE: 102 mmol/L (ref 101–111)
CO2: 25 mmol/L (ref 22–32)
Calcium: 9.4 mg/dL (ref 8.9–10.3)
Creatinine, Ser: 0.6 mg/dL (ref 0.44–1.00)
GFR calc non Af Amer: >60 mL/min (ref >60)
Glucose, Bld: 90 mg/dL (ref 65–99)
Potassium: 4 mmol/L (ref 3.5–5.1)
SODIUM: 136 mmol/L (ref 135–145)
Total Bilirubin: 0.4 mg/dL (ref 0.3–1.2)
Total Protein: 7.2 g/dL (ref 6.5–8.1)

## 2017-09-21 LAB — I-STAT BETA HCG BLOOD, ED (MC, WL, AP ONLY): I-stat hCG, quantitative: <5 m[IU]/mL (ref <5)

## 2017-09-21 LAB — CBC
HCT: 39.9 % (ref 36.0–46.0)
HEMOGLOBIN: 12.6 g/dL (ref 12.0–15.0)
MCH: 25.7 pg — AB (ref 26.0–34.0)
MCHC: 31.6 g/dL (ref 30.0–36.0)
MCV: 81.3 fL (ref 78.0–100.0)
Platelets: 344 10*3/uL (ref 150–400)
RBC: 4.91 MIL/uL (ref 3.87–5.11)
RDW: 15 % (ref 11.5–15.5)
WBC: 6.9 10*3/uL (ref 4.0–10.5)

## 2017-09-21 LAB — URINALYSIS, ROUTINE W REFLEX MICROSCOPIC
Bilirubin Urine: NEGATIVE
GLUCOSE, UA: NEGATIVE mg/dL
KETONES UR: NEGATIVE mg/dL
Leukocytes, UA: NEGATIVE
Nitrite: NEGATIVE
PROTEIN: NEGATIVE mg/dL
Specific Gravity, Urine: 1.012 (ref 1.005–1.030)
pH: 5 (ref 5.0–8.0)

## 2017-09-21 LAB — LIPASE, BLOOD: LIPASE: 37 U/L (ref 11–51)

## 2017-09-21 MED ORDER — KETOROLAC TROMETHAMINE 30 MG/ML IJ SOLN
30.0000 mg | Freq: Once | INTRAMUSCULAR | Status: AC
  Administered 2017-09-21: 30 mg via INTRAMUSCULAR
  Filled 2017-09-21: qty 1

## 2017-09-21 MED ORDER — DICYCLOMINE HCL 20 MG PO TABS: 20.0000 mg | ORAL_TABLET | Freq: Two times a day (BID) | ORAL | 0 refills | Status: DC

## 2017-09-21 MED ORDER — NAPROXEN 500 MG PO TABS: 500.0000 mg | ORAL_TABLET | Freq: Two times a day (BID) | ORAL | 0 refills | Status: DC
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
## 2017-09-21 NOTE — ED Notes (Signed)
ED Provider at bedside. 

## 2017-09-21 NOTE — ED Notes (Signed)
Pt native language is Arabic. Arabic interpreter used for communication.
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
## 2017-09-21 NOTE — Discharge Instructions (Signed)
Please read attached information regarding your condition. Take naproxen twice daily to help with neck pain and muscle pains. Take Bentyl as needed for your abdominal pain. Follow-up with your primary care provider for further evaluation. Return to ED for worsening symptoms, additional injuries, severe abdominal pain, increased vomiting, lightheadedness or loss of consciousness.

## 2017-09-21 NOTE — ED Notes (Signed)
Patient transported to CT 

## 2017-09-28 ENCOUNTER — Ambulatory Visit: Payer: No Typology Code available for payment source | Attending: Family Medicine

## 2017-11-14 ENCOUNTER — Ambulatory Visit: Payer: BLUE CROSS/BLUE SHIELD | Attending: Internal Medicine

## 2018-03-25 ENCOUNTER — Ambulatory Visit: Payer: BLUE CROSS/BLUE SHIELD | Admitting: Nurse Practitioner

## 2018-04-23 ENCOUNTER — Other Ambulatory Visit: Payer: Self-pay | Admitting: Nurse Practitioner

## 2018-04-23 ENCOUNTER — Ambulatory Visit: Payer: BLUE CROSS/BLUE SHIELD | Attending: Nurse Practitioner | Admitting: Nurse Practitioner

## 2018-04-23 ENCOUNTER — Encounter: Payer: Self-pay | Admitting: Nurse Practitioner

## 2018-04-23 VITALS — BP 127/89 | HR 94 | Temp 98.9°F | Ht 60.0 in | Wt 203.0 lb

## 2018-04-23 DIAGNOSIS — K219 Gastro-esophageal reflux disease without esophagitis: Secondary | ICD-10-CM | POA: Insufficient documentation

## 2018-04-23 DIAGNOSIS — G8929 Other chronic pain: Secondary | ICD-10-CM | POA: Insufficient documentation

## 2018-04-23 DIAGNOSIS — R1030 Lower abdominal pain, unspecified: Secondary | ICD-10-CM | POA: Diagnosis not present

## 2018-04-23 DIAGNOSIS — Z79899 Other long term (current) drug therapy: Secondary | ICD-10-CM | POA: Insufficient documentation

## 2018-04-23 DIAGNOSIS — R109 Unspecified abdominal pain: Secondary | ICD-10-CM

## 2018-04-23 DIAGNOSIS — Z8249 Family history of ischemic heart disease and other diseases of the circulatory system: Secondary | ICD-10-CM | POA: Diagnosis not present

## 2018-04-23 DIAGNOSIS — R7303 Prediabetes: Secondary | ICD-10-CM | POA: Diagnosis not present

## 2018-04-23 DIAGNOSIS — Z6839 Body mass index (BMI) 39.0-39.9, adult: Secondary | ICD-10-CM | POA: Diagnosis not present

## 2018-04-23 DIAGNOSIS — E669 Obesity, unspecified: Secondary | ICD-10-CM | POA: Insufficient documentation

## 2018-04-23 LAB — POCT GLYCOSYLATED HEMOGLOBIN (HGB A1C): HEMOGLOBIN A1C: 5.7 % — AB (ref 4.0–5.6)

## 2018-04-23 LAB — GLUCOSE, POCT (MANUAL RESULT ENTRY): POC Glucose: 139 mg/dl — AB (ref 70–99)

## 2018-04-23 MED ORDER — DICYCLOMINE HCL 20 MG PO TABS
20.0000 mg | ORAL_TABLET | Freq: Two times a day (BID) | ORAL | 0 refills | Status: DC
Start: 2018-04-23 — End: 2019-06-03

## 2018-04-23 MED ORDER — PHENTERMINE HCL 37.5 MG PO TABS
37.5000 mg | ORAL_TABLET | Freq: Every day | ORAL | 1 refills | Status: DC
Start: 2018-04-23 — End: 2020-08-18

## 2018-04-23 MED ORDER — PHENTERMINE HCL 37.5 MG PO TABS: 37.5000 mg | ORAL_TABLET | Freq: Every day | ORAL | 1 refills | Status: DC

## 2018-04-23 MED ORDER — TRIAMCINOLONE ACETONIDE 0.1 % EX CREA: 1.0000 "application " | TOPICAL_CREAM | Freq: Two times a day (BID) | CUTANEOUS | 0 refills | Status: DC

## 2018-04-23 MED ORDER — OMEPRAZOLE 20 MG PO CPDR: DELAYED_RELEASE_CAPSULE | ORAL | 3 refills | Status: DC

## 2018-04-23 NOTE — Progress Notes (Signed)
Assessment & Plan:  Candice [name] was seen today for establish care.  Diagnoses and all orders for this visit:  Obesity (BMI 30-39.9) -     phentermine (ADIPEX-P) 37.5 MG tablet; Take 1 tablet (37.5 mg total) by mouth daily before breakfast.  Prediabetes -     Glucose (CBG) -     HgB A1c Controlled Continue blood sugar control as discussed in office today, low carbohydrate diet, and regular physical exercise as tolerated, 150 minutes per week (30 min each day, 5 days per week, or 50 min 3 days per week).   Gastroesophageal reflux disease, esophagitis presence not specified -     Ambulatory referral to Gastroenterology -     omeprazole (PRILOSEC) 20 MG capsule; TAKE 1 CAPSULE BY MOUTH 2 TIMES DAILY BEFORE A MEAL. INSTRUCTIONS: Avoid GERD Triggers: acidic, spicy or fried foods, caffeine, coffee, sodas,  alcohol and chocolate.    Chronic abdominal pain -     dicyclomine (BENTYL) 20 MG tablet; Take 1 tablet (20 mg total) by mouth 2 (two) times daily.    Patient has been counseled on age-appropriate routine health concerns for screening and prevention. These are reviewed and up-to-date. Referrals have been placed accordingly. Immunizations are up-to-date or declined.    Subjective:   Chief Complaint  Patient presents with  . Establish Care    Pt. is here for medication refill for Omeprazole.    HPI Candice [name] 42 y.o. female presents to office today to establish care. She has prediabetes, obesity, GERD and a history of IBS (however she said she does not recall being diagnosed with IBS). VRI was used to communicate directly with patient for the entire encounter including providing detailed patient instructions.    Prediabetes Chronic. Stable. Currently not on any oral diabetic agents. She denies any hypo or hyperglycemic symptoms.  Lab Results  Component Value Date   HGBA1C 5.7 (A) 04/23/2018   Obesity BMI 39.7. She is requesting weight loss medicine today. She gives me her phone  and tells me she wants "one of these medications prescribed".  She states she has a friend who is a physician and has instructed her to request one of the following: Orlistat, Liraglutide or locaserin.  I instruct her that I only prescribe phentermine and if she would like any of the other medications I will be glad to refer her to a weight loss center. She declines referral and is agreeable to starting phentermine. I have instructed her that she needs to incorporate an exercise program along with healthy dietary habits in order to lose weight. She states when she worked out in the past she would gain weight instead of losing. I have instructed her to mostly try cardio and then later on after weight loss she can incorporate some weight training. TSH from last year was normal.   Lower abdominal Pain Chronic. She states ongoing since 07-2017 when she was treated for Hpylori (repeat Hpylori was negative). She is requesting a refill of her Hpylori treatment and to be retested. I have explained to her if she has continued to have pain despite the treatment she will need to see GI as the repeat Hpylori was negative. I will refill her bentyl and omeprazole today. She states the pain is constant and describes it as sharp and cramping. There are no aggravating factors. She denies any constipation, diarrhea, nausea or vomiting. Currently seeing a fertility specialist however she states pelvic US did not reveal any fibroids or ovarian cysts.  Review of Systems  Constitutional: Negative for fever, malaise/fatigue and weight loss.  HENT: Negative.  Negative for nosebleeds.   Eyes: Negative.  Negative for blurred vision, double vision and photophobia.  Respiratory: Negative.  Negative for cough and shortness of breath.   Cardiovascular: Negative.  Negative for chest pain, palpitations and leg swelling.  Gastrointestinal: Positive for abdominal pain. Negative for blood in stool, constipation, diarrhea, heartburn,  melena, nausea and vomiting.  Musculoskeletal: Negative.  Negative for myalgias.  Neurological: Negative.  Negative for dizziness, focal weakness, seizures and headaches.  Psychiatric/Behavioral: Negative.  Negative for suicidal ideas.    Past Medical History:  Diagnosis Date  . Irritable bowel syndrome (IBS)   . Prediabetes     Past Surgical History:  Procedure Laterality Date  . 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 DILATION AND CURETTAGE OF UTERUS  2006  . HEMORRHOID SURGERY  2015  . [place]ID [place]     [place]    Family History  Problem Relation Age of Onset  . Hypertension Father     Social History Reviewed with no changes to be made today.   Outpatient Medications Prior to Visit  Medication Sig Dispense Refill  . estrogen, conjugated,-medroxyprogesterone (PREMPRO) 0.625-5 MG tablet Take 1 tablet by mouth daily.    Marland Kitchen omeprazole (PRILOSEC) 20 MG capsule TAKE 1 CAPSULE BY MOUTH 2 TIMES DAILY BEFORE A MEAL. 60 capsule 3  . Biotin 5 MG TABS Take 1 tablet (5 mg total) by mouth daily. (Patient not taking: Reported on 04/23/2018)  0  . DHEA 50 MG CAPS Take 1 capsule by mouth. Reported on 04/06/2016    . fluconazole (DIFLUCAN) 150 MG tablet TAKE ONE TABLET BY MOUTH ONCE. MAY REPEAT DOXE IN 5 DAYS (Patient not taking: Reported on 04/23/2018) 2 tablet 0  . fluticasone (FLONASE) 50 MCG/ACT nasal spray Place 2 sprays into both nostrils daily. (Patient not taking: Reported on 04/23/2018) 16 g 6  . Multiple Vitamins-Calcium (ONE-A-DAY WOMENS FORMULA) TABS Take 1 tablet by mouth daily. (Patient not taking: Reported on 04/23/2018)  0  . naproxen (NAPROSYN) 500 MG tablet Take 1 tablet (500 mg total) by mouth 2 (two) times daily. (Patient not taking: Reported on 04/23/2018) 30 tablet 0  . acetaminophen (TYLENOL) 500 MG tablet Take 2 tablets (1,000 mg total) every 6 (six) hours as needed by mouth for moderate pain. (Patient not taking: Reported on 04/23/2018) 30 tablet 0    . azelastine (OPTIVAR) 0.05 % ophthalmic solution Place 1 drop into both eyes 2 (two) times daily. (Patient not taking: Reported on 04/23/2018) 6 mL 12  . cetirizine (ZYRTEC) 10 MG tablet Take 1 tablet (10 mg total) by mouth daily. (Patient not taking: Reported on 04/23/2018) 30 tablet 11  . dicyclomine (BENTYL) 20 MG tablet Take 1 tablet (20 mg total) by mouth 2 (two) times daily. (Patient not taking: Reported on 04/23/2018) 20 tablet 0   No facility-administered medications prior to visit.     No Known Allergies     Objective:    BP 127/89 (BP Location: Left Arm, Patient Position: Sitting, Cuff Size: Large)   Pulse 94   Temp 98.9 F (37.2 C) (Oral)   Ht 5' (1.524 m)   Wt 203 lb (92.1 kg)   SpO2 98%   BMI 39.65 kg/m  Wt Readings from Last 3 Encounters:  04/23/18 203 lb (92.1 kg)  08/03/17 207 lb 12.8 oz (94.3 kg)  05/23/17 208  lb 9.6 oz (94.6 kg)    Physical Exam  Constitutional: She is oriented to person, place, and time. She appears well-developed and well-nourished. She is cooperative.  HENT:  Head: Normocephalic and atraumatic.  Eyes: EOM are normal.  Neck: Normal range of motion.  Cardiovascular: Normal rate, regular rhythm and normal heart sounds. Exam reveals no gallop and no friction rub.  No murmur heard. Pulmonary/Chest: Effort normal and breath sounds normal. No tachypnea. No respiratory distress. She has no decreased breath sounds. She has no wheezes. She has no rhonchi. She has no rales. She exhibits no tenderness.  Abdominal: Soft. Normal appearance and bowel sounds are normal. She exhibits no distension and no mass. There is tenderness in the suprapubic area. There is no rigidity, no rebound, no guarding, no CVA tenderness, no tenderness at McBurney's point and negative Murphy's sign. No hernia.  Musculoskeletal: Normal range of motion. She exhibits no edema.  Neurological: She is alert and oriented to person, place, and time. Coordination normal.  Skin: Skin is  warm and dry.  Psychiatric: She has a normal mood and affect. Her behavior is normal. Judgment and thought content normal.  Nursing note and vitals reviewed.      Patient has been counseled extensively about nutrition and exercise as well as the importance of adherence with medications and regular follow-up. The patient was given clear instructions to go to ER or return to medical center if symptoms don't improve, worsen or new problems develop. The patient verbalized understanding.   Follow-up: Return in about 1 month (around 05/21/2018) for Weight loss/abdominal pain.   Claiborne RiggZelda W Natashia Roseman, FNP-BC [facility] [place], [place] [phone]   04/23/2018, 1:21 PM

## 2018-04-23 NOTE — Patient Instructions (Signed)
You can have your abdominal xray performed in Radiology at [facility] [address], [place], [place] [address]

## 2018-12-13 ENCOUNTER — Other Ambulatory Visit: Payer: Self-pay

## 2018-12-13 ENCOUNTER — Emergency Department (HOSPITAL_COMMUNITY): Admission: EM | Admit: 2018-12-13 | Payer: BLUE CROSS/BLUE SHIELD | Source: Home / Self Care

## 2019-06-03 ENCOUNTER — Other Ambulatory Visit: Payer: Self-pay

## 2019-06-03 ENCOUNTER — Emergency Department (HOSPITAL_COMMUNITY): Payer: BLUE CROSS/BLUE SHIELD

## 2019-06-03 ENCOUNTER — Emergency Department (HOSPITAL_COMMUNITY)
Admission: EM | Admit: 2019-06-03 | Discharge: 2019-06-03 | Disposition: A | Discharge status: Home / Self Care | Payer: BLUE CROSS/BLUE SHIELD | Attending: Emergency Medicine | Admitting: Emergency Medicine

## 2019-06-03 ENCOUNTER — Encounter (HOSPITAL_COMMUNITY): Payer: Self-pay | Admitting: *Deleted

## 2019-06-03 DIAGNOSIS — M542 Cervicalgia: Secondary | ICD-10-CM | POA: Diagnosis not present

## 2019-06-03 DIAGNOSIS — R1013 Epigastric pain: Secondary | ICD-10-CM | POA: Diagnosis not present

## 2019-06-03 DIAGNOSIS — M25519 Pain in unspecified shoulder: Secondary | ICD-10-CM | POA: Insufficient documentation

## 2019-06-03 DIAGNOSIS — M545 Low back pain: Secondary | ICD-10-CM | POA: Diagnosis not present

## 2019-06-03 DIAGNOSIS — R319 Hematuria, unspecified: Secondary | ICD-10-CM | POA: Insufficient documentation

## 2019-06-03 DIAGNOSIS — G8929 Other chronic pain: Secondary | ICD-10-CM | POA: Diagnosis not present

## 2019-06-03 DIAGNOSIS — R110 Nausea: Secondary | ICD-10-CM | POA: Diagnosis not present

## 2019-06-03 DIAGNOSIS — R1030 Lower abdominal pain, unspecified: Secondary | ICD-10-CM | POA: Diagnosis present

## 2019-06-03 DIAGNOSIS — Z79899 Other long term (current) drug therapy: Secondary | ICD-10-CM | POA: Insufficient documentation

## 2019-06-03 DIAGNOSIS — R197 Diarrhea, unspecified: Secondary | ICD-10-CM | POA: Insufficient documentation

## 2019-06-03 DIAGNOSIS — R109 Unspecified abdominal pain: Secondary | ICD-10-CM

## 2019-06-03 DIAGNOSIS — K219 Gastro-esophageal reflux disease without esophagitis: Secondary | ICD-10-CM

## 2019-06-03 LAB — COMPREHENSIVE METABOLIC PANEL
ALT: 22 U/L (ref 0–44)
AST: 26 U/L (ref 15–41)
Albumin: 3.2 g/dL — ABNORMAL LOW (ref 3.5–5.0)
Alkaline Phosphatase: 120 U/L (ref 38–126)
Anion gap: 12 (ref 5–15)
BUN: 13 mg/dL (ref 6–20)
CO2: 26 mmol/L (ref 22–32)
Calcium: 9.1 mg/dL (ref 8.9–10.3)
Chloride: 98 mmol/L (ref 98–111)
Creatinine, Ser: 0.78 mg/dL (ref 0.44–1.00)
GFR calc Af Amer: >60 mL/min (ref >60)
GFR calc non Af Amer: >60 mL/min (ref >60)
Glucose, Bld: 170 mg/dL — ABNORMAL HIGH (ref 70–99)
Potassium: 3.8 mmol/L (ref 3.5–5.1)
Sodium: 136 mmol/L (ref 135–145)
Total Bilirubin: 0.6 mg/dL (ref 0.3–1.2)
Total Protein: 6.8 g/dL (ref 6.5–8.1)

## 2019-06-03 LAB — URINALYSIS, ROUTINE W REFLEX MICROSCOPIC
Bacteria, UA: NONE SEEN
Bilirubin Urine: NEGATIVE
Glucose, UA: NEGATIVE mg/dL
Hgb urine dipstick: NEGATIVE
Ketones, ur: 5 mg/dL — AB
Leukocytes,Ua: NEGATIVE
Nitrite: NEGATIVE
Protein, ur: 30 mg/dL — AB
Specific Gravity, Urine: 1.03 (ref 1.005–1.030)
pH: 5 (ref 5.0–8.0)

## 2019-06-03 LAB — I-STAT BETA HCG BLOOD, ED (MC, WL, AP ONLY): I-stat hCG, quantitative: <5 m[IU]/mL (ref <5)

## 2019-06-03 LAB — CBC
HCT: 41.6 % (ref 36.0–46.0)
Hemoglobin: 13.3 g/dL (ref 12.0–15.0)
MCH: 27 pg (ref 26.0–34.0)
MCHC: 32 g/dL (ref 30.0–36.0)
MCV: 84.6 fL (ref 80.0–100.0)
Platelets: 345 10*3/uL (ref 150–400)
RBC: 4.92 MIL/uL (ref 3.87–5.11)
RDW: 13.2 % (ref 11.5–15.5)
WBC: 8.2 10*3/uL (ref 4.0–10.5)
nRBC: 0 % (ref 0.0–0.2)

## 2019-06-03 LAB — LIPASE, BLOOD: Lipase: 121 U/L — ABNORMAL HIGH (ref 11–51)

## 2019-06-03 MED ORDER — DICYCLOMINE HCL 20 MG PO TABS: 20.0000 mg | ORAL_TABLET | Freq: Two times a day (BID) | ORAL | 1 refills | Status: DC

## 2019-06-03 MED ORDER — LIDOCAINE 5 % EX PTCH
1.0000 | MEDICATED_PATCH | Freq: Once | CUTANEOUS | Status: DC
  Administered 2019-06-03: 1 via TRANSDERMAL
  Filled 2019-06-03: qty 1

## 2019-06-03 MED ORDER — LIDOCAINE VISCOUS HCL 2 % MT SOLN
15.0000 mL | Freq: Once | OROMUCOSAL | Status: AC
  Administered 2019-06-03: 12:00:00 15 mL via ORAL
  Filled 2019-06-03: qty 15

## 2019-06-03 MED ORDER — LIDOCAINE 5 % EX PTCH: 1.0000 | MEDICATED_PATCH | CUTANEOUS | 0 refills | Status: DC

## 2019-06-03 MED ORDER — SODIUM CHLORIDE 0.9% FLUSH: 3.0000 mL | Freq: Once | INTRAVENOUS | Status: DC

## 2019-06-03 MED ORDER — ALUM & MAG HYDROXIDE-SIMETH 200-200-20 MG/5ML PO SUSP
30.0000 mL | Freq: Once | ORAL | Status: AC
  Administered 2019-06-03: 30 mL via ORAL
  Filled 2019-06-03: qty 30

## 2019-06-03 MED ORDER — IOHEXOL 300 MG/ML  SOLN
100.0000 mL | Freq: Once | INTRAMUSCULAR | Status: AC | PRN
  Administered 2019-06-03: 100 mL via INTRAVENOUS

## 2019-06-03 MED ORDER — OMEPRAZOLE 20 MG PO CPDR: DELAYED_RELEASE_CAPSULE | ORAL | 2 refills | Status: DC

## 2019-06-03 MED FILL — OMEPRAZOLE 20 MG CAP: 20 | 30 days supply | Qty: 60 | Fill #0

## 2019-06-03 MED FILL — DICYCLOMINE 20 MG TABLET: 20 | 30 days supply | Qty: 60 | Fill #0

## 2019-06-03 NOTE — ED Provider Notes (Signed)
[facility] EMERGENCY DEPARTMENT Provider Note   CSN: [id number] Arrival date & time: 06/03/19  0019     History   Chief Complaint Chief Complaint  Patient presents with  . Abdominal Pain  . multiple complaints    HPI Candice [name] is a 43 y.o. female with past medical history of IBS and prediabetes presents emergency department today with multiple medical complaints.  She is reporting abdominal pain in bilateral lower quadrants that has been going on x2 days.  She states the pain is sharp feels like pain she has had in the past.  She rates it 10 out of 10 in severity.  She is also reporting a burning sensation throughout her abdomen, well as nausea without emesis.  She has had multiple episodes of diarrhea over the last 4 hours.  She denies any blood in stool.  She is reporting gross hematuria with left flank pain.  Pain is dull and does not radiate. she states she has a history of kidney stones her last one was in 2014.  She has never followed up with urology.  He has been taking diclofenac with improvement of flank pain.  Her LMP was in July, she states she has irregular periods.  She denies any vaginal discharge, vaginal bleeding, fever, chills, chest pain, shortness of breath.  Denies any alcohol consumption or drug use.  Due to language barrier, a video interpreter was present during the history-taking and subsequent discussion (and for part of the physical exam) with this patient.   Past Medical History:  Diagnosis Date  . Irritable bowel syndrome (IBS)   . Prediabetes     Patient Active Problem List   Diagnosis Date Noted  . Prediabetes 04/23/2018  . History of gastroesophageal reflux (GERD) 08/03/2017  . History of vitamin D deficiency 08/03/2017  . History of Helicobacter pylori infection 08/03/2017  . History of urinary tract infection 08/03/2017  . Asymptomatic microscopic hematuria 08/03/2017  . Chronic abdominal pain 08/03/2017  . Left lower  quadrant pain 06/05/2016  . Hemorrhoid 04/06/2016  . Neck pain 10/28/2015  . Shoulder pain, bilateral 10/28/2015  . Vertigo 07/29/2015  . Tachycardia 07/08/2015  . Vitamin D deficiency 04/08/2015  . Lumbar back pain with radiculopathy affecting left lower extremity 04/07/2015  . 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 Chronic pain of multiple joints 01/29/2015  . Irregular menses 10/01/2014  . Allergic rhinitis 10/01/2014  . Chalazion of left upper eyelid 10/01/2014  . 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 DILATION AND CURETTAGE OF UTERUS  2006  . HEMORRHOID SURGERY  2015  . HEMORRHOID SURGERY     in [place]     OB History    Gravida  0   Para  0   Term  0   Preterm  0   AB  0   Living  0     SAB  0   TAB  0   Ectopic  0   Multiple  0   Live Births               Home Medications    Prior to Admission medications   Medication Sig Start Date End Date Taking? Authorizing Provider  Biotin 5 MG TABS Take 1 tablet (5 mg total) by mouth daily. Patient not taking: Reported on 04/23/2018 04/16/17  Hairston, Mandesia R, FNP  DHEA 50 MG CAPS Take 1 capsule by mouth. Reported on 04/06/2016    [provider]  dicyclomine (BENTYL) 20 MG tablet Take 1 tablet (20 mg total) by mouth 2 (two) times daily. 06/03/19   Roshard Rezabek E, PA-C  estrogen, conjugated,-medroxyprogesterone (PREMPRO) 0.625-5 MG tablet Take 1 tablet by mouth daily.    [provider]  fluconazole (DIFLUCAN) 150 MG tablet TAKE ONE TABLET BY MOUTH ONCE. MAY REPEAT DOXE IN 5 DAYS Patient not taking: Reported on 04/23/2018 06/26/17   Lizbeth BarkHairston, Mandesia R, FNP  fluticasone (FLONASE) 50 MCG/ACT nasal spray Place 2 sprays into both nostrils daily. Patient not taking: Reported on 04/23/2018 07/17/17   Lizbeth BarkHairston, Mandesia R, FNP  lidocaine (LIDODERM) 5 %  Place 1 patch onto the skin daily. Remove & Discard patch within 12 hours or as directed by MD 06/03/19   Ostin Mathey, Caroleen HammanKaitlyn E, PA-C  Multiple Vitamins-Calcium (ONE-A-DAY WOMENS FORMULA) TABS Take 1 tablet by mouth daily. Patient not taking: Reported on 04/23/2018 04/16/17   Lizbeth BarkHairston, Mandesia R, FNP  naproxen (NAPROSYN) 500 MG tablet Take 1 tablet (500 mg total) by mouth 2 (two) times daily. Patient not taking: Reported on 04/23/2018 09/21/17   Dietrich PatesKhatri, Hina, PA-C  omeprazole (PRILOSEC) 20 MG capsule TAKE 1 CAPSULE BY MOUTH 2 TIMES DAILY BEFORE A MEAL. 06/03/19   Elois Averitt, Caroleen HammanKaitlyn E, PA-C  phentermine (ADIPEX-P) 37.5 MG tablet Take 1 tablet (37.5 mg total) by mouth daily before breakfast. 04/23/18   Claiborne RiggFleming, Zelda W, NP    Family History Family History  Problem Relation Age of Onset  . Hypertension Father     Social History Social History   Tobacco Use  . Smoking status: Never Smoker  . Smokeless tobacco: Never Used  Substance Use Topics  . Alcohol use: No  . Drug use: No     Allergies   Patient has no known allergies.   Review of Systems Review of Systems  Constitutional: Negative for chills and fever.  HENT: Negative for congestion, ear discharge, ear pain, sinus pressure, sinus pain and sore throat.   Eyes: Negative for pain and redness.  Respiratory: Negative for cough and shortness of breath.   Cardiovascular: Negative for chest pain.  Gastrointestinal: Positive for abdominal pain, diarrhea and nausea. Negative for constipation and vomiting.  Genitourinary: Positive for flank pain and hematuria. Negative for dysuria.  Musculoskeletal: Negative for back pain and neck pain.  Skin: Negative for wound.  Neurological: Negative for weakness, numbness and headaches.     Physical Exam Updated Vital Signs BP (!) 145/70 (BP Location: Right Arm)   Pulse 84   Temp 98.2 F (36.8 C) (Oral)   Resp 16   SpO2 100%   Physical Exam Vitals signs and nursing note reviewed.   Constitutional:      General: She is not in acute distress.    Appearance: She is not ill-appearing.  HENT:     Head: Normocephalic and atraumatic.     Right Ear: Tympanic membrane and external ear normal.     Left Ear: Tympanic membrane and external ear normal.     Nose: Nose normal.     Mouth/Throat:     Mouth: Mucous membranes are moist.     Pharynx: Oropharynx is clear.     Comments: Minor erythema to oropharynx, no edema, no exudate, no tonsillar swelling, voice normal, neck supple without lymphadenopathy. No oral swelling.  No signs of angioedema. Eyes:     General: No scleral  icterus.       Right eye: No discharge.        Left eye: No discharge.     Extraocular Movements: Extraocular movements intact.     Conjunctiva/sclera: Conjunctivae normal.     Pupils: Pupils are equal, round, and reactive to light.  Neck:     Musculoskeletal: Normal range of motion.     Vascular: No JVD.     Comments: Full ROM intact without spinous process TTP. No bony stepoffs or deformities, No paraspinous muscle TTP or muscle spasms. No rigidity or meningeal signs. No bruising, erythema, or swelling.  Cardiovascular:     Rate and Rhythm: Normal rate and regular rhythm.     Pulses: Normal pulses.          Radial pulses are 2+ on the right side and 2+ on the left side.       Dorsalis pedis pulses are 2+ on the right side and 2+ on the left side.     Heart sounds: Normal heart sounds.  Pulmonary:     Comments: Lungs clear to auscultation in all fields. Symmetric chest rise. No wheezing, rales, or rhonchi. Abdominal:     Tenderness: There is no right CVA tenderness or left CVA tenderness. Negative signs include Murphy's sign, Rovsing's sign and McBurney's sign.     Comments: Abdomen is soft, non-distended.  Generalized abdominal tenderness worse in epigastric area. No rigidity, no guarding. No peritoneal signs. Normoactive bowel sounds  Musculoskeletal: Normal range of motion.     Comments:  Spontaneous movement in all extremities.  No signs of injury noted. Bilateral grip strength is 5/5 in upper extremities. No tenderness to palpation of bilateral scapulas.  Full range of motion of the T-spine and L-spine No tenderness to palpation of the spinous processes of the T-spine or L-spine No crepitus, deformity or step-offs No tenderness to palpation of the paraspinous muscles of the L-spine    Skin:    General: Skin is warm and dry.     Capillary Refill: Capillary refill takes less than 2 seconds.  Neurological:     Mental Status: She is oriented to person, place, and time.     GCS: GCS eye subscore is 4. GCS verbal subscore is 5. GCS motor subscore is 6.     Comments: Fluent speech, no facial droop.  Psychiatric:        Behavior: Behavior normal.      ED Treatments / Results  Labs (all labs ordered are listed, but only abnormal results are displayed) Labs Reviewed  LIPASE, BLOOD - Abnormal; Notable for the following components:      Result Value   Lipase 121 (*)    All other components within normal limits  COMPREHENSIVE METABOLIC PANEL - Abnormal; Notable for the following components:   Glucose, Bld 170 (*)    Albumin 3.2 (*)    All other components within normal limits  URINALYSIS, ROUTINE W REFLEX MICROSCOPIC - Abnormal; Notable for the following components:   Color, Urine AMBER (*)    APPearance CLOUDY (*)    Ketones, ur 5 (*)    Protein, ur 30 (*)    All other components within normal limits  CBC  I-STAT BETA HCG BLOOD, ED ([facility], [facility], [facility] ONLY)    EKG None  Radiology Ct Abdomen Pelvis W Contrast  Result Date: 06/03/2019 CLINICAL DATA:  Chronic left flank pain with new hematuria. Diarrhea and lower abdominal pain. EXAM: CT ABDOMEN AND PELVIS WITH CONTRAST TECHNIQUE: Multidetector  CT imaging of the abdomen and pelvis was performed using the standard protocol following bolus administration of intravenous contrast. CONTRAST:  100 mL OMNIPAQUE IOHEXOL 300 MG/ML   SOLN COMPARISON:  CT abdomen and pelvis 09/21/2017. FINDINGS: Lower chest: Heart size is normal. No pleural or pericardial effusion. Lung bases clear. Hepatobiliary: No focal liver abnormality is seen. No gallstones, gallbladder wall thickening, or biliary dilatation. Pancreas: Unremarkable. No pancreatic ductal dilatation or surrounding inflammatory changes. Spleen: Normal in size without focal abnormality. Adrenals/Urinary Tract: Left adrenal adenoma is unchanged. The right adrenal gland appears normal. Two cysts left renal cysts are identified. The kidneys otherwise appear normal. No hydronephrosis. No urinary tract stones are seen although patient motion could obscure tiny renal stones. Urinary bladder appears normal. Stomach/Bowel: Stomach is within normal limits. Appendix appears normal. No evidence of bowel wall thickening, distention, or inflammatory changes. Vascular/Lymphatic: No significant vascular findings are present. No enlarged abdominal or pelvic lymph nodes. Reproductive: Uterus and bilateral adnexa are unremarkable. Other: None. Musculoskeletal: No acute or focal abnormality. Degenerative disc disease L4-5 and L5-S1 noted. IMPRESSION: No acute abnormality or finding to explain the patient's symptoms. No change in a benign left adrenal adenoma. Degenerative disc disease lower lumbar spine. Electronically Signed   By: Drusilla Kanner M.D.   On: 06/03/2019 14:34    Procedures Procedures (including critical care time)  Medications Ordered in ED Medications  sodium chloride flush (NS) 0.9 % injection 3 mL (has no administration in time range)  lidocaine (LIDODERM) 5 % 1 patch (has no administration in time range)  alum & mag hydroxide-simeth (MAALOX/MYLANTA) 200-200-20 MG/5ML suspension 30 mL (30 mLs Oral Given 06/03/19 1219)    And  lidocaine (XYLOCAINE) 2 % viscous mouth solution 15 mL (15 mLs Oral Given 06/03/19 1219)  iohexol (OMNIPAQUE) 300 MG/ML solution 100 mL (100 mLs Intravenous  Contrast Given 06/03/19 1420)     Initial Impression / Assessment and Plan / ED Course  I have reviewed the triage vital signs and the nursing notes.  Pertinent labs & imaging results that were available during my care of the patient were reviewed by me and considered in my medical decision making (see chart for details).  Patient seen and examined. Patient nontoxic appearing, in no apparent distress, vitals WNL.  She is presenting with multiple medical complaints but states she would like to focus on her kidneys and belly pain.  According to the triage note she has had problems with her tonsils for a while and neck and shoulder pain for 1.5 years.  On exam she has no erythema to posterior oropharynx, no exudates seen.  Airway is intact, no swelling, she is speaking in full sentences.  She has generalized abdominal tenderness, worse in epigastric area. No CVA tenderness.  No peritoneal signs, unlikely surgical abdomen.  I personally viewed labs ordered in triage.  CMP and CBC unremarkable, no leukocytosis, severe electrolyte derangement, renal insufficiency, or elevated liver enzymes. Lipase is slightly elevated at 121. UA without blood or signs of infection.  Beta-hCG is negative.  With elevated lipase, CT abdomen pelvis ordered and is without acute findings.  No signs of pancreatitis.  Radiologist does comment on degenerative disc disease in lower lumbar spine, suspect this could be cause of her lumbar pain.  She is tolerating p.o. intake while here in the ED. She has been unable to follow-up with PCP because of the pandemic, will refill Prilosec and Bentyl prescriptions.  Will also prescribe Lidoderm patch for back pain.   The  patient appears reasonably screened and/or stabilized for discharge and I doubt any other medical condition or other [facility] requiring further screening, evaluation, or treatment in the ED at this time prior to discharge. The patient is safe for discharge with strict return  precautions discussed. Recommend pcp follow up.   Portions of this note were generated with [employer]. Dictation errors may occur despite best attempts at proofreading.    Final Clinical Impressions(s) / ED Diagnoses   Final diagnoses:  Abdominal pain, unspecified abdominal location  Chronic left-sided low back pain without sciatica    ED Discharge Orders         Ordered    omeprazole (PRILOSEC) 20 MG capsule     06/03/19 1526    dicyclomine (BENTYL) 20 MG tablet  2 times daily     06/03/19 1526    lidocaine (LIDODERM) 5 %  Every 24 hours     06/03/19 [place], Harley Hallmark, PA-C 06/03/19 [place], [place], DO 06/03/19 1717

## 2019-06-03 NOTE — ED Triage Notes (Signed)
Pt speaks arabic and utilized the [employer].  Pt is here with multiple complaints for several years.   1. Kidney problems for a while with left flank pain and now has blood in urine.2 Diarrhea that is bad and she has lower abdominal pain . 3. Heat sensitive feeling in belly like fire. 4. Tonsils have a problem for a while. 5. Neck and shoulder pain for 1.5 years.  LMP in July

## 2019-06-03 NOTE — ED Notes (Signed)
Pt verbalizes understanding of discharge instruction and medications. Opportunity for questions and answers provided.

## 2019-06-03 NOTE — Discharge Instructions (Signed)
You have been seen today for belly pain, back pain, blood in urine. Please read and follow all provided instructions. Return to the emergency room for worsening condition or new concerning symptoms.    Your work-up today is overall unremarkable.  CT scan does not show signs of infection.  Your urine sample not have blood or signs of infection.  1. Medications:  Prescriptions sent to your pharmacy for your previous medications Prilosec and Bentyl.  Please take as prescribed. -Prescription also sent for patch that you can wear on your back for pain. Continue usual home medications  Take medications as prescribed. Please review all of the medicines and only take them if you do not have an allergy to them.   2. Treatment: rest, drink plenty of fluids  3. Follow Up: Please follow up with your primary doctor in 2-5 days for discussion of your diagnoses and further evaluation after today's visit; Call today to arrange your follow up.   It is also a possibility that you have an allergic reaction to any of the medicines that you have been prescribed - Everybody reacts differently to medications and while MOST people have no trouble with most medicines, you may have a reaction such as nausea, vomiting, rash, swelling, shortness of breath. If this is the case, please stop taking the medicine immediately and contact your physician.  ?

## 2019-06-03 NOTE — ED Notes (Signed)
Pt tolerated PO fluids with no c/o

## 2019-06-03 NOTE — ED Notes (Signed)
Pt getting undress

## 2020-06-11 ENCOUNTER — Ambulatory Visit: Payer: BLUE CROSS/BLUE SHIELD | Admitting: Nurse Practitioner

## 2020-07-12 ENCOUNTER — Other Ambulatory Visit: Payer: Self-pay

## 2020-07-12 ENCOUNTER — Ambulatory Visit: Payer: 59 | Attending: Nurse Practitioner | Admitting: Pharmacist

## 2020-07-12 ENCOUNTER — Ambulatory Visit: Payer: 59 | Attending: Nurse Practitioner | Admitting: Nurse Practitioner

## 2020-07-12 DIAGNOSIS — Z23 Encounter for immunization: Secondary | ICD-10-CM | POA: Diagnosis not present

## 2020-07-12 NOTE — Progress Notes (Signed)
Patient presents for vaccination against influenza per orders of Zelda. Consent given. Counseling provided. No contraindications exists. Vaccine administered without incident.  ° °Luke Van Ausdall, PharmD, CPP °Clinical Pharmacist °[facility] °[phone] ° °

## 2020-07-12 NOTE — Progress Notes (Signed)
Unavailable by phone.

## 2020-08-18 ENCOUNTER — Other Ambulatory Visit: Payer: Self-pay | Admitting: Nurse Practitioner

## 2020-08-18 ENCOUNTER — Encounter: Payer: Self-pay | Admitting: Nurse Practitioner

## 2020-08-18 ENCOUNTER — Ambulatory Visit: Payer: 59 | Attending: Nurse Practitioner | Admitting: Nurse Practitioner

## 2020-08-18 ENCOUNTER — Other Ambulatory Visit: Payer: Self-pay

## 2020-08-18 VITALS — BP 123/83 | HR 96 | Temp 97.6°F | Ht 61.5 in | Wt 226.0 lb

## 2020-08-18 DIAGNOSIS — N926 Irregular menstruation, unspecified: Secondary | ICD-10-CM

## 2020-08-18 DIAGNOSIS — L918 Other hypertrophic disorders of the skin: Secondary | ICD-10-CM

## 2020-08-18 DIAGNOSIS — R7303 Prediabetes: Secondary | ICD-10-CM

## 2020-08-18 DIAGNOSIS — Z6841 Body Mass Index (BMI) 40.0 and over, adult: Secondary | ICD-10-CM

## 2020-08-18 DIAGNOSIS — M5416 Radiculopathy, lumbar region: Secondary | ICD-10-CM

## 2020-08-18 DIAGNOSIS — R399 Unspecified symptoms and signs involving the genitourinary system: Secondary | ICD-10-CM

## 2020-08-18 DIAGNOSIS — Z1159 Encounter for screening for other viral diseases: Secondary | ICD-10-CM | POA: Diagnosis not present

## 2020-08-18 DIAGNOSIS — J029 Acute pharyngitis, unspecified: Secondary | ICD-10-CM

## 2020-08-18 DIAGNOSIS — Z1231 Encounter for screening mammogram for malignant neoplasm of breast: Secondary | ICD-10-CM

## 2020-08-18 DIAGNOSIS — B372 Candidiasis of skin and nail: Secondary | ICD-10-CM

## 2020-08-18 LAB — POCT URINALYSIS DIP (CLINITEK)
Bilirubin, UA: NEGATIVE
Glucose, UA: NEGATIVE mg/dL
Ketones, POC UA: NEGATIVE mg/dL
Leukocytes, UA: NEGATIVE
Nitrite, UA: NEGATIVE
POC PROTEIN,UA: NEGATIVE
Spec Grav, UA: 1.02 (ref 1.010–1.025)
Urobilinogen, UA: 0.2 E.U./dL
pH, UA: 5.5 (ref 5.0–8.0)

## 2020-08-18 LAB — POCT GLYCOSYLATED HEMOGLOBIN (HGB A1C): Hemoglobin A1C: 6.1 % — AB (ref 4.0–5.6)

## 2020-08-18 LAB — GLUCOSE, POCT (MANUAL RESULT ENTRY): POC Glucose: 129 mg/dl — AB (ref 70–99)

## 2020-08-18 MED ORDER — CYCLOBENZAPRINE HCL 5 MG PO TABS: 5.0000 mg | ORAL_TABLET | Freq: Three times a day (TID) | ORAL | 2 refills | Status: AC | PRN

## 2020-08-18 MED ORDER — KETOCONAZOLE 2 % EX CREA: 1.0000 "application " | TOPICAL_CREAM | Freq: Every day | CUTANEOUS | 1 refills | Status: DC

## 2020-08-18 MED ORDER — IBUPROFEN 600 MG PO TABS: 600.0000 mg | ORAL_TABLET | Freq: Three times a day (TID) | ORAL | 1 refills | Status: DC | PRN

## 2020-08-18 MED ORDER — CEPACOL 15-2.3 MG MT LOZG: LOZENGE | OROMUCOSAL | 1 refills | Status: DC

## 2020-08-18 MED FILL — CYCLOBENZAPRINE 5 MG TABLET: 5 | 20 days supply | Qty: 60 | Fill #0

## 2020-08-18 MED FILL — KETOCONAZOLE 2% CREAM: 2 | 30 days supply | Qty: 60 | Fill #0

## 2020-08-18 NOTE — Progress Notes (Signed)
Assessment & Plan:  Candice [name] was seen today for establish care.  Diagnoses and all orders for this visit:  Prediabetes -     Glucose (CBG) -     HgB A1c -     CMP14+EGFR  UTI symptoms -     POCT URINALYSIS DIP (CLINITEK)  Lumbar back pain with radiculopathy affecting left lower extremity -     cyclobenzaprine (FLEXERIL) 5 MG tablet; Take 1 tablet (5 mg total) by mouth 3 (three) times daily as needed for muscle spasms. -     ibuprofen (ADVIL) 600 MG tablet; Take 1 tablet (600 mg total) by mouth every 8 (eight) hours as needed. Work on losing weight to help reduce back pain. May alternate with heat and ice application for pain relief. May also alternate with acetaminophen as prescribed for back pain. Other alternatives include massage, acupuncture and water aerobics.  You must stay active and avoid a sedentary lifestyle.    Need for hepatitis C screening test -     HCV Ab w Reflex to Quant PCR  Breast cancer screening by mammogram -     MM 3D SCREEN BREAST BILATERAL; Future  Morbid obesity with BMI of 40.0-44.9, adult (HCC) -     Lipid panel -     TSH Discussed diet and exercise for person with BMI >42. Instructed: You must burn more calories than you eat. Losing 5 percent of your body weight should be considered a success. In the longer term, losing more than 15 percent of your body weight and staying at this weight is an extremely good result. However, keep in mind that even losing 5 percent of your body weight leads to important health benefits, so try not to get discouraged if you're not able to lose more than this. Will recheck weight in 3-6 months.  Irregular menses -     CBC  Cutaneous candidiasis -     ketoconazole (NIZORAL) 2 % cream; Apply 1 application topically daily.  Pharyngitis, unspecified etiology -     Benzocaine-Menthol (CEPACOL) 15-2.3 MG LOZG; Take daily as prescribed for sore throat    Patient has been counseled on age-appropriate routine health concerns  for screening and prevention. These are reviewed and up-to-date. Referrals have been placed accordingly. Immunizations are up-to-date or declined.    Subjective:   Chief Complaint  Patient presents with  . Establish Care    Pt. is here to re-establish care. Pt. would like PCP to write a letter for job for work accomodation due to her back pain. Pt. would like to check to see if she have a UTI.    HPI Candice [name] 44 y.o. female presents to office today to establish care. Patient has been counseled on age-appropriate routine health concerns for screening and prevention. These are reviewed and up-to-date. Referrals have been placed accordingly. Immunizations are up-to-date or declined.    Mammogram: Refer to the breast center today for scheduling Pap smear: She declines her pap smear today.  She endorses intermittently sore throat and swollen tonsillar lymph nodes however by my exam there are no palpable lymph nodes in the face or neck today.   Has complaints of vaginal irritation and "spots" on her inner thighs.  On exam she has numerous skin tags on the bilateral inner thighs and near the buttocks.  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  This is a letter her previous PCP had submitted on her behalf a few years ago.  She  has chronic back pain with BMI of 42. Declines referral to spine specialist today.  At this time she would just like to continue on prescription NSAID and muscle relaxant. Medications she has tried in the past include naproxen, ibuprofen, diclofenac and cyclobenzaprine. Aggravating factors: lifting, bending, twisting.  MRI 02/05/2015 1. 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 Mild left greater than right foraminal stenosis at each level. 2. Superimposed epidural lipomatosis.  Prediabetes She does not currently take any medications for  diabetes.  Diet controlled at this time. Lab Results  Component Value Date   HGBA1C 6.1 (A) 08/18/2020    Review of Systems  Constitutional: Negative for fever, malaise/fatigue and weight loss.  HENT: Positive for sore throat. Negative for nosebleeds.   Eyes: Negative.  Negative for blurred vision, double vision and photophobia.  Respiratory: Negative.  Negative for cough and shortness of breath.   Cardiovascular: Negative.  Negative for chest pain, palpitations and leg swelling.  Gastrointestinal: Negative.  Negative for heartburn, nausea and vomiting.  Genitourinary: Positive for urgency.       Vaginal irritation  Musculoskeletal: Positive for back pain. Negative for myalgias.  Skin: Positive for rash.  Neurological: Negative.  Negative for dizziness, focal weakness, seizures and headaches.  Psychiatric/Behavioral: Negative.  Negative for suicidal ideas.    Past Medical History:  Diagnosis Date  . Irritable bowel syndrome (IBS)   . Prediabetes     Past Surgical History:  Procedure Laterality Date  . 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 DILATION AND CURETTAGE OF UTERUS  2006  . HEMORRHOID SURGERY  2015  . HEMORRHOID SURGERY     in [place]    Family History  Problem Relation Age of Onset  . Hypertension Father     Social History Reviewed with no changes to be made today.   Outpatient Medications Prior to Visit  Medication Sig Dispense Refill  . Biotin 5 MG TABS Take 1 tablet (5 mg total) by mouth daily. (Patient not taking: Reported on 04/23/2018)  0  . DHEA 50 MG CAPS Take 1 capsule by mouth. Reported on 04/06/2016 (Patient not taking: Reported on 08/18/2020)    . dicyclomine (BENTYL) 20 MG tablet Take 1 tablet (20 mg total) by mouth 2 (two) times daily. (Patient not taking: Reported on 08/18/2020) 60 tablet 1  . estrogen, conjugated,-medroxyprogesterone (PREMPRO) 0.625-5 MG tablet Take 1 tablet by mouth daily. (Patient not taking: Reported on  08/18/2020)    . fluconazole (DIFLUCAN) 150 MG tablet TAKE ONE TABLET BY MOUTH ONCE. MAY REPEAT DOXE IN 5 DAYS (Patient not taking: Reported on 04/23/2018) 2 tablet 0  . fluticasone (FLONASE) 50 MCG/ACT nasal spray Place 2 sprays into both nostrils daily. (Patient not taking: Reported on 04/23/2018) 16 g 6  . lidocaine (LIDODERM) 5 % Place 1 patch onto the skin daily. Remove & Discard patch within 12 hours or as directed by MD (Patient not taking: Reported on 08/18/2020) 30 patch 0  . Multiple Vitamins-Calcium (ONE-A-DAY WOMENS FORMULA) TABS Take 1 tablet by mouth daily. (Patient not taking: Reported on 04/23/2018)  0  . naproxen (NAPROSYN) 500 MG tablet Take 1 tablet (500 mg total) by mouth 2 (two) times daily. (Patient not taking: Reported on 04/23/2018) 30 tablet 0  . omeprazole (PRILOSEC) 20 MG capsule TAKE 1 CAPSULE BY MOUTH 2 TIMES DAILY BEFORE A MEAL. (Patient not taking:  Reported on 08/18/2020) 60 capsule 2  . phentermine (ADIPEX-P) 37.5 MG tablet Take 1 tablet (37.5 mg total) by mouth daily before breakfast. (Patient not taking: Reported on 08/18/2020) 30 tablet 1   No facility-administered medications prior to visit.    No Known Allergies     Objective:    BP 123/83 (BP Location: Left Arm, Patient Position: Sitting, Cuff Size: Large)   Pulse 96   Temp 97.6 F (36.4 C) (Temporal)   Ht 5' 1.5" (1.562 m)   Wt 226 lb (102.5 kg)   SpO2 98%   BMI 42.01 kg/m  Wt Readings from Last 3 Encounters:  08/18/20 226 lb (102.5 kg)  04/23/18 203 lb (92.1 kg)  08/03/17 207 lb 12.8 oz (94.3 kg)    Physical Exam Vitals and nursing note reviewed.  Constitutional:      Appearance: She is well-developed. She is obese.  HENT:     Head: Normocephalic and atraumatic.     Mouth/Throat:     Lips: Pink.     Tongue: No lesions.     Palate: No mass.     Pharynx: Oropharynx is clear. No pharyngeal swelling or posterior oropharyngeal erythema.  Cardiovascular:     Rate and Rhythm: Normal rate and regular  rhythm.     Heart sounds: Normal heart sounds. No murmur heard.  No friction rub. No gallop.   Pulmonary:     Effort: Pulmonary effort is normal. No tachypnea or respiratory distress.     Breath sounds: Normal breath sounds. No decreased breath sounds, wheezing, rhonchi or rales.  Chest:     Chest wall: No tenderness.  Abdominal:     General: Bowel sounds are normal.     Palpations: Abdomen is soft.  Genitourinary:    Comments: Numerous skin tags on bilateral inner thighs. Visible candida in groin folds Musculoskeletal:        General: Normal range of motion.     Cervical back: Normal range of motion.       Feet:  Lymphadenopathy:     Head:     Right side of head: No submental, submandibular or tonsillar adenopathy.     Left side of head: No submental, submandibular or tonsillar adenopathy.     Cervical: No cervical adenopathy.  Skin:    General: Skin is warm and dry.  Neurological:     Mental Status: She is alert and oriented to person, place, and time.     Coordination: Coordination normal.  Psychiatric:        Behavior: Behavior normal. Behavior is cooperative.        Thought Content: Thought content normal.        Judgment: Judgment normal.          Patient has been counseled extensively about nutrition and exercise as well as the importance of adherence with medications and regular follow-up. The patient was given clear instructions to go to ER or return to medical center if symptoms don't improve, worsen or new problems develop. The patient verbalized understanding.   Follow-up: Return for schedule PAP smear .   Gildardo Pounds, FNP-BC [facility] [place] [place]   08/18/2020, 1:38 PM

## 2020-09-23 ENCOUNTER — Other Ambulatory Visit: Payer: 59

## 2020-09-23 DIAGNOSIS — Z20822 Contact with and (suspected) exposure to covid-19: Secondary | ICD-10-CM

## 2020-09-26 LAB — NOVEL CORONAVIRUS, NAA: SARS-CoV-2, NAA: DETECTED — AB

## 2020-09-27 ENCOUNTER — Telehealth: Payer: Self-pay | Admitting: *Deleted

## 2020-09-27 NOTE — Telephone Encounter (Signed)
Patient has much improved and declines any treatment.

## 2020-10-15 ENCOUNTER — Telehealth: Payer: Self-pay | Admitting: Nurse Practitioner

## 2020-10-15 NOTE — Telephone Encounter (Signed)
Copied from CRM [id number]. Topic: General - Other >> Oct 15, 2020  4:09 PM Gwenlyn Fudge wrote: Reason for CRM: Pt called stating that she received a call from someone regarding her labs. Pt would like to have a nurse and a translator call her to go over them. Please advise.

## 2020-10-21 NOTE — Telephone Encounter (Signed)
Attempt to reach patient to inform on results. No answer and LVM for call back.  

## 2020-10-21 NOTE — Telephone Encounter (Signed)
Patient returned phone call to receive results. Please follow up.

## 2020-10-25 NOTE — Telephone Encounter (Signed)
Patient came into the clinic and was able to receive her results.

## 2021-03-16 ENCOUNTER — Encounter: Payer: Self-pay | Admitting: Family Medicine

## 2021-03-16 ENCOUNTER — Other Ambulatory Visit: Payer: Self-pay

## 2021-03-16 ENCOUNTER — Ambulatory Visit: Payer: 59 | Attending: Family Medicine | Admitting: Family Medicine

## 2021-03-16 VITALS — BP 129/88 | HR 109 | Ht 60.0 in | Wt 225.6 lb

## 2021-03-16 DIAGNOSIS — E669 Obesity, unspecified: Secondary | ICD-10-CM | POA: Diagnosis not present

## 2021-03-16 DIAGNOSIS — E1169 Type 2 diabetes mellitus with other specified complication: Secondary | ICD-10-CM

## 2021-03-16 DIAGNOSIS — M542 Cervicalgia: Secondary | ICD-10-CM | POA: Diagnosis not present

## 2021-03-16 LAB — POCT GLYCOSYLATED HEMOGLOBIN (HGB A1C): HbA1c, POC (prediabetic range): 6.6 % — AB (ref 5.7–6.4)

## 2021-03-16 MED ORDER — CYCLOBENZAPRINE HCL 5 MG PO TABS
ORAL_TABLET | Freq: Three times a day (TID) | ORAL | 2 refills | Status: DC | PRN
  Filled 2021-03-16: qty 60, 20d supply, fill #0

## 2021-03-16 MED ORDER — ONETOUCH VERIO VI STRP
ORAL_STRIP | 12 refills | Status: DC
  Filled 2021-03-16: qty 100, 25d supply, fill #0

## 2021-03-16 MED ORDER — METFORMIN HCL 500 MG PO TABS
500.0000 mg | ORAL_TABLET | Freq: Every day | ORAL | 1 refills | Status: DC
  Filled 2021-03-16 – 2021-03-25 (×2): qty 90, 90d supply, fill #0

## 2021-03-16 MED ORDER — ONETOUCH VERIO REFLECT W/DEVICE KIT
1.0000 | PACK | Freq: Every day | 0 refills | Status: DC
  Filled 2021-03-16: qty 1, 30d supply, fill #0

## 2021-03-16 NOTE — Patient Instructions (Signed)
??? ?????? ????????? ???????? Diabetes Mellitus and Nutrition, Adult ????? ????? ?? ?????? ?? ??? ??????? ???? ?? ??????? ???? ?? ???? ????? ????? ???? ???? ??? ??????? ??? ???? (????????) ????? ???? ???? ??? ????? ??????. ?? ????? ??????? ?????? ?????? ?????? ?? ???????? ????? ??????? ?? ???? ?????? ???: ?????? ?? ??????? ?????? ????. ????? ????? ??????? ?????? ?????. ????? ??? ????. ?????? ??? ????? ????? ??????? ????. ?? ???? ???? ?? ???? ??? ??? ??????? ?????? ??? ????? ?? ??? ????? ?????? ?? ????? ???? ??? ???????? ?????? ??????? ???? ???????. ??? ?? ???? ????? ??????? ?????? ?? ?????? ?? ?????? ???? ?????? ???? ??? ????? ?????? ????? ?? ????. ??? ????? ??? ?????? ????? ?????? ???: ??? ??????? ???????? ???? ??????. ??????? ???? ????????. ????. ???? ?????? ???? ???? ???? ????????????. ????? ?????? ???? ??????. ??????? ?????? ?????? ???? ????? ????? ??? ??? ????? ?? ??? ??????. ??? ???? ???????????? ????? ???? ???????????? ??? ????? ???????? ????? ???? ?? ?? ??? ??? ?? ??????? ????????. ????? ????? ???????????? ???? ????? ?? ???? ???????? ?? ????. ???? ???????????? ????? ????? ?? ??????? ?? ??????????. ????? ???? ???????????? ??? ???? ?????? ??? ?????? ???? ??? ??????? ?????? ??????? ??? ??? ?????? ??????????? ?????? ????? ????? ?? ???? ???? ????? ??? ??????. ?? ????? ?? ???? ???? ???????????? ???? ???? ?? ???????? ????? ?? ?? ????. ????? ??? ??????? ??????? ??? ???. ????? ?? ?????? ??????? ??????? ?? ???? ???????????????? ???? ????? ???? ??????? ?? ?? ???? ????? ?? ???? ?????. ??? ???? ??????? ????????? ????? ???? ?? ????? ????????? ?? ??????? ???????? ?? ?????? ???? (??? ??? ????)? ???? ??? ??? ?????? ????????? ?? ?????? ????? ???? ????? ?? ???? ????. ???? ??? ???? ???? ????? ?? ???? ??????. ?? ????? ??? ??? ???? ???????? ??????? ??????? ?????? ???? ????? ??????? ?? ????? ?????????. ?? ?????? ????????? ??????? ?? ??????? ???????: ??? ????? ???? ??????? ?????? ??????? ?????? ????. ??? ????  ??????? ?? ?????? ?????? ?? ??????? ????? ?? ?????? ??????. ??? ??? ?????? ????????? ????????: ?? ?????? ????????? ???????? ??? ???? ?????. ??? ???? ?????? ????????? ??? ????? ??????: ????? ???? ??? ???? ??????. ??????? ??? ???? ??????. ????? ??? ???? ?????? ?? ??????. ?? ???????? ???????? ????? ??????? ?????? ???? ??? 12 ????? (355 ??) ?? ?????? ?? ??? ??? 5 ?????? (148 ??) ?? ?????? ?? ??? ??? ????? ???? (44 ??) ?? ????????? ???????? ??????. ???? ?????? ?? ???? ?????? ??? ????? ?? ?????? ???? ?? ????? ??? ????? ?????? ?? ???????? ??? ???????. ?? ?????? ?? ?????? ???????? ????????? ?????????? ???????? ?????? ?? ????? ??? ???? ????? ?? ????? ???? ?? ????? ??? ????????????. ?? ??????? ??????? ?????? ??? ??????  ????? ?????? ????????? ???????? ???? ??????? ?? ????? ??????? ??? ???? "????????? ????????" ??? ??????? ?????????? ???????. ?????? ???????? ???????? ????????????? ??????? ???????? ???????? ?????? ??????? ??? ?????? ?? ???? ???? ?? ????? ????? ????. ?? ???? ?????? ?? ???????? ????? ?????? ??????? ??? ???? ?? ????? ?????. ???? ?? ?????? ???????????? (?????????) ?? ?? ????? ?????. ????? ???? ??? ?????? ???????????? ?? ????? ??????? ?????? ????? ?????? ???????????? ??? 15. ???? ??? ?????? ???????????? ?? ?????? 30 ?????? ????? ?? ????? ???????? ??? ??? ????? ????? ????? ?? ????????????. ???? ?? ??? ?????? ?????? ??????? ??????? ??? ??????? ?? ????? ???????. ???? ??????? ???? ????? ??? ???? ????? ?? ??? ?????? ?? ??????? ????. ???? ?? ??? ????????? ????? (????????) ?? ????? ??????? ??????. ????? ??? ???? ??????? ????? ???? ???????? ????? ??? ?? 2,300 ??????? ?? ?????. ???? ?????? ??? ?????? ??????? ?????? ???????? ??? ??????? ?????? ????? "?????? ??????" "  low-fat" ?? "?? ????? ??? ????" ?? "????? ?? ??????""nonfat". ???? ??????? ?? ????? ??? ????? ???? ?? ????? ?????? ?? ???????????? ??????? ???? ??????. ???? ??? ??????? ????? ???????? ?????? ??????? ??????? ?? ?????? ???????? ??????? ???  ??????. ?????? ???? ????? ??????? ??????? ?? ?????? ?????? ?? ??????? ????????. ???? ??????? ????? ?????? ??? ????? ????? ?? ????????? ?????? ?????? ???????. ????? ?? ???? ????? ???? ??????? ????????. ???? ??? ?????? ?? ??? ??????? ??????? ????????? ??????? ??????? ??????? ??????? ??????? ??????? ???????. ????? ?????? ?????? ??? ??? ????? ??????? ??? ???????? ????? ?? ????? ??? ????? ????? ??????? ??? ????? ??????. ?????? ?????? ?????? ?? ????? ??? ???? ??????? ?? ???????? ?? ????? ?????. ???? ????? ??????? ?? ??????? ?? ?????? ????? ??????. ????? ??????? ????? ??????? ???????? ???????? ??????? ???????? ????? ?? ????? ???? ?? ???. ???? ?????? ???? ????? ??? ????? ???? ???? ????? ?????. ????? ??????? ?????? ???????? ??? ??????? ??????? ????????? ?????????? ??????? ???????. ????? ??????? ????? ???????? ??????? ?? ???? ??? ??? ???????????? ???? ????? ?? ???????? ?? ?? ????. ????? 4-6 ?????? (112-168 ????) ?? ?????????? ????? ?????? ?? ???? ??? ??????? ????? ?????? ?? ?????? ??????? ????????? ?? ?????? ?? ??????. ??? ????? (?????) ????? ?? ?????????? ????? ?????? ??????: ????? ????? (28 ????) ?? ?????? ?? ??????? ?? ???????. ???? ?????.  ??? (62 ??????) ?? ??????. ????? ?????? ??? ??????? ???? ????? ??? ???? ????? ??? ????????? ????????? ??????? ????????. ?? ??????? ???? ??? ?? ????????? ??????? ?????. ??????. ????????. ?????. ??????. ???????. ?????. ???????. ???????.??????. ??????. ?????. ????????? ????. ???????. ???????? ???????? ??? ?? ??? ?????? ??????? ?????? ???????? ?????? ?????? ???????? ???????. ???????. ????????. ???????. ????????. ?????.????? ??????. ???????. ??????. ?????. ??????. ??????? ???????? ????? ??????. ?????? ?????? ??????? ??? ????? ?????? ?? ??????? ?????? ??????? ?????????? ????????????????? ?????????. ??????? ??? ???????. ???????. ????? ????? ?? ?????. ?????? ??????????? ?????? ????????? ???????. ??????? ???? ?????. ??? ??????? ?????? ??????. ??????.????????.  ??????. ?????? ??????? ?????? ???????? ?????? ?? ?????? ?????? ??? ??????? ?????? ??????? ??????. ???? ?? ???? ??????? ??????? ????? ??????? ??????? ?? ????????? ?????????? ???? ????? ???????. ??? ????? ??????? ??????? ?????? ?????? ?? ?????????. ?? ??????? ?????? ??????? ??????? ??????? ??????? ?? ????. ????????? ????????? ???????. ???????? ??????? ?? ??????? ?? ???? ???????. ?????? ?????? ?????? ?????? ?????? ??????? ?????? ??? ?????? ?????????? ??????????????? ???????. ???? ???? ??????? ????????. ?????? ??????????? ?????? ??? ????? ?????? ??????. ??????? ?? ??????. ????? ?????? ?????? ?? ??????.?????? ???????. ???? ?????? ???????. ?????? ??????? ????? ?? ??? ?? ???? ???? ??????. ????????? ????????? ???????? ??? ????? ?????? ?? ??????. ???? ?? ???? ??????? ??????? ????? ??????? ??????? ?? ????????? ?????????? ???? ????? ???? ??????. ??? ????? ??????? ??????? ?????? ?????? ?? ?????????. ??????? ???? ????? ????? ??? ??????? ??????? ?????? ??????? ?? ?? ????? ??? ?????? ??????? ??? ???? ??????? ?? ????? ??? ??????? ??????? ?????? ?? ??? ?????? ???? ?????? ??????? ?? ?????? ?? ??? ????? ???? ?? ???? ??? ???? ?????? ????? ????? ?????? ??? ?????? ?? ?????????: ??????? ????????? ?????? (American Diabetes Association): diabetes.org ???????? ??????? ???????? ???????? (Academy of Nutrition and Dietetics): [email or web address] ?????? ?????? ?????? ?????? ?????? ?????? ?????? (National Institute of Diabetes and Digestive and Kidney Diseases): [email or web address] ????? ?????? ????? ???? ?????? ???????? (Association of Diabetes Care and Education Specialists): [email or web address] ???? ?? ??????? ???? ?? ???? ????? ????? ???? ???? ??? ??????? ??? ???? (????????) ????? ???? ???? ??? ????? ??????. ??????? ????? ??????? ?????? ??? ?????? ?? ?????? ???? ??????? ??? ??? ???? ???. ??? ?? ???? ????? ??????? ?????? ?? ?????? ?? ?????? ???? ?????? ???? ??? ????? ?????? ????? ?? ????. ??? ?????? ??  ???????????? ??????? ???? ???? ????? ??? ???????? ?????? ????. ???? ?? ????? ???? ???? ???????????? ????? ????? ??????. ??? ????? ?? ??? ????????? ?? ???? ?????? ????????? ???? ?????? ???? ?????????????. ???? ?? ?????? ??? ????? ???? ?? ???? ?? ???? ??????? ??????.?   Document Revised: 10/08/2019 Document Reviewed: 10/08/2019 [employer] Patient Education  2021 [employer]  

## 2021-03-16 NOTE — Progress Notes (Signed)
Recent a1c done on 02/17/2021 was 6.7. Having pain in neck and back.

## 2021-03-16 NOTE — Progress Notes (Signed)
Subjective:  Patient ID: Candice [name], female    DOB: [birth year]  Age: 45 y.o. MRN: [id number]  CC: Prediabetes   HPI Nanci Lakatos is a 45 year old female patient of Geryl Rankins, FNP with a history of prediabetes, obesity who presents today for an acute visit.  Interval History: She had outside labs performed which revealed an A1c of 6.7 which has her concerned.  She was previously prediabetic with her A1c from 08/2020 at 6.1.  She has not been on medication for prediabetes. She complains of pain in her neck which is chronic and is controlled on Flexeril.  She is requesting refills.  Past Medical History:  Diagnosis Date   Irritable bowel syndrome (IBS)    Prediabetes     Past Surgical History:  Procedure Laterality Date   CIRCUMCISION  as a young girl    Type III femake circumcision    DILATION AND CURETTAGE OF UTERUS  2006   HEMORRHOID SURGERY  2015   HEMORRHOID SURGERY     in [place]    Family History  Problem Relation Age of Onset   Hypertension Father     No Known Allergies  Outpatient Medications Prior to Visit  Medication Sig Dispense Refill   cyclobenzaprine (FLEXERIL) 5 MG tablet TAKE 1 TABLET (5 MG TOTAL) BY MOUTH 3 (THREE) TIMES DAILY AS NEEDED FOR MUSCLE SPASMS. 60 tablet 2   Benzocaine-Menthol (CEPACOL) 15-2.3 MG LOZG Take daily as prescribed for sore throat (Patient not taking: Reported on 03/16/2021) 16 lozenge 1   ibuprofen (ADVIL) 600 MG tablet Take 1 tablet (600 mg total) by mouth every 8 (eight) hours as needed. (Patient not taking: Reported on 03/16/2021) 60 tablet 1   ketoconazole (NIZORAL) 2 % cream Apply 1 application topically daily. (Patient not taking: Reported on 03/16/2021) 60 g 1   ketoconazole (NIZORAL) 2 % cream APPLY 1 APPLICATION TOPICALLY DAILY. (Patient not taking: Reported on 03/16/2021) 60 g 1   No facility-administered medications prior to visit.     ROS Review of Systems  Constitutional:  Negative for activity change, appetite  change and fatigue.  HENT:  Negative for congestion, sinus pressure and sore throat.   Eyes:  Negative for visual disturbance.  Respiratory:  Negative for cough, chest tightness, shortness of breath and wheezing.   Cardiovascular:  Negative for chest pain and palpitations.  Gastrointestinal:  Negative for abdominal distention, abdominal pain and constipation.  Endocrine: Negative for polydipsia.  Genitourinary:  Negative for dysuria and frequency.  Musculoskeletal:  Positive for neck pain. Negative for arthralgias and back pain.  Skin:  Negative for rash.  Neurological:  Negative for tremors, light-headedness and numbness.  Hematological:  Does not bruise/bleed easily.  Psychiatric/Behavioral:  Negative for agitation and behavioral problems.    Objective:  BP 129/88   Pulse (!) 109   Ht 5' (1.524 m)   Wt 225 lb 9.6 oz (102.3 kg)   SpO2 98%   BMI 44.06 kg/m   BP/Weight 03/16/2021 08/18/2020 5/85/2778  Systolic BP 242 353 614  Diastolic BP 88 83 76  Wt. (Lbs) 225.6 226 -  BMI 44.06 42.01 -      Physical Exam Constitutional:      Appearance: She is well-developed.  Neck:     Vascular: No JVD.  Cardiovascular:     Rate and Rhythm: Tachycardia present.     Heart sounds: Normal heart sounds. No murmur heard. Pulmonary:     Effort: Pulmonary effort is normal.     Breath sounds:  Normal breath sounds. No wheezing or rales.  Chest:     Chest wall: No tenderness.  Abdominal:     General: Bowel sounds are normal. There is no distension.     Palpations: Abdomen is soft. There is no mass.     Tenderness: There is no abdominal tenderness.  Musculoskeletal:        General: Normal range of motion.     Right lower leg: No edema.     Left lower leg: No edema.  Neurological:     Mental Status: She is alert and oriented to person, place, and time.  Psychiatric:        Mood and Affect: Mood normal.    CMP Latest Ref Rng & Units 06/03/2019 09/21/2017 08/03/2017  Glucose 70 - 99 mg/dL  170(H) 90 81  BUN 6 - 20 mg/dL _0 Creatinine 0.44 - 1.00 mg/dL 0.78 0.60 0.55(L)  Sodium 135 - 145 mmol/L 136 136 139  Potassium 3.5 - 5.1 mmol/L 3.8 4.0 3.8  Chloride 98 - 111 mmol/L 98 102 99  CO2 22 - 32 mmol/L _1 Calcium 8.9 - 10.3 mg/dL 9.1 9.4 9.8  Total Protein 6.5 - 8.1 g/dL 6.8 7.2 -  Total Bilirubin 0.3 - 1.2 mg/dL 0.6 0.4 -  Alkaline Phos 38 - 126 U/L 120 114 -  AST 15 - 41 U/L 26 21 -  ALT 0 - 44 U/L 22 17 -    Lipid Panel  No results found for: CHOL, TRIG, HDL, CHOLHDL, VLDL, LDLCALC, LDLDIRECT  CBC    Component Value Date/Time   WBC 8.2 06/03/2019 0105   RBC 4.92 06/03/2019 0105   HGB 13.3 06/03/2019 0105   HGB 12.5 08/03/2017 1220   HCT 41.6 06/03/2019 0105   HCT 39.5 08/03/2017 1220   PLT 345 06/03/2019 0105   PLT 387 (H) 08/03/2017 1220   MCV 84.6 06/03/2019 0105   MCV 78 (L) 08/03/2017 1220   MCH 27.0 06/03/2019 0105   MCHC 32.0 06/03/2019 0105   RDW 13.2 06/03/2019 0105   RDW 14.2 08/03/2017 1220   LYMPHSABS 2.5 02/27/2017 1624   MONOABS 498 05/04/2016 1124   EOSABS 0.3 02/27/2017 1624   BASOSABS 0.0 02/27/2017 1624    Lab Results  Component Value Date   HGBA1C 6.6 (A) 03/16/2021    Assessment & Plan:  1. Diabetes mellitus type 2 in obese [facility]) Newly diagnosed with A1c of 6.6 Metformin initiated Educated on pathophysiology of type 2 diabetes mellitus Schedule with the clinical pharmacist for diabetic education on use of glucometer Counseled on Diabetic diet, my plate method, 160 minutes of moderate intensity exercise/week Blood sugar logs with fasting goals of 80-120 mg/dl, random of less than 180 and in the event of sugars less than 60 mg/dl or greater than 400 mg/dl encouraged to notify the clinic. Advised on the need for annual eye exams, annual foot exams, Pneumonia vaccine. - POCT glycosylated hemoglobin (Hb A1C) - Blood Glucose Monitoring Suppl (ONETOUCH VERIO REFLECT) w/Device KIT; use daily as directed   Dispense: 1  kit; Refill: 0 - glucose blood (ONETOUCH VERIO) test strip; Use as instructed  Dispense: 100 each; Refill: 12 - metFORMIN (GLUCOPHAGE) 500 MG tablet; Take 1 tablet (500 mg total) by mouth daily with breakfast.  Dispense: 90 tablet; Refill: 1  2. Neck pain Stable - cyclobenzaprine (FLEXERIL) 5 MG tablet; TAKE 1 TABLET (5 MG TOTAL) BY MOUTH 3 (THREE) TIMES DAILY AS NEEDED FOR MUSCLE SPASMS.  Dispense: 60 tablet; Refill: 2    Meds ordered this encounter  Medications   cyclobenzaprine (FLEXERIL) 5 MG tablet    Sig: TAKE 1 TABLET (5 MG TOTAL) BY MOUTH 3 (THREE) TIMES DAILY AS NEEDED FOR MUSCLE SPASMS.    Dispense:  60 tablet    Refill:  2   Blood Glucose Monitoring Suppl (ONETOUCH VERIO REFLECT) w/Device KIT    Sig: use daily as directed     Dispense:  1 kit    Refill:  0   glucose blood (ONETOUCH VERIO) test strip    Sig: Use as instructed    Dispense:  100 each    Refill:  12   metFORMIN (GLUCOPHAGE) 500 MG tablet    Sig: Take 1 tablet (500 mg total) by mouth daily with breakfast.    Dispense:  90 tablet    Refill:  1     Follow-up: Return in about 1 week (around 03/23/2021) for Diabetic education with Lurena Joiner; 3 months with PCP Geryl Rankins for diabetes.       Charlott Rakes, MD, FAAFP. [facility] [place], [place]   03/16/2021, 5:13 PM

## 2021-03-17 ENCOUNTER — Other Ambulatory Visit: Payer: Self-pay

## 2021-03-24 ENCOUNTER — Other Ambulatory Visit: Payer: Self-pay

## 2021-03-25 ENCOUNTER — Ambulatory Visit: Payer: 59 | Attending: Nurse Practitioner | Admitting: Pharmacist

## 2021-03-25 ENCOUNTER — Other Ambulatory Visit: Payer: Self-pay

## 2021-03-25 DIAGNOSIS — Z7189 Other specified counseling: Secondary | ICD-10-CM | POA: Diagnosis not present

## 2021-03-25 NOTE — Progress Notes (Signed)
Patient was educated on the use of the True Metrix blood glucose meter. Reviewed necessary supplies and operation of the meter. Also reviewed goal blood glucose levels. Patient was able to demonstrate use. All questions and concerns were addressed.  Time spent counseling: 15 minutes  Butch Penny, PharmD, [place], CPP Clinical Pharmacist [facility] [phone]

## 2021-04-20 ENCOUNTER — Ambulatory Visit: Payer: 59 | Admitting: Nurse Practitioner

## 2021-05-24 ENCOUNTER — Encounter (HOSPITAL_COMMUNITY): Payer: Self-pay

## 2021-05-24 ENCOUNTER — Other Ambulatory Visit: Payer: Self-pay

## 2021-05-24 ENCOUNTER — Ambulatory Visit (HOSPITAL_COMMUNITY)
Admission: EM | Admit: 2021-05-24 | Discharge: 2021-05-24 | Disposition: A | Discharge status: Home / Self Care | Payer: 59

## 2021-05-24 DIAGNOSIS — Z789 Other specified health status: Secondary | ICD-10-CM | POA: Diagnosis not present

## 2021-05-24 DIAGNOSIS — M5441 Lumbago with sciatica, right side: Secondary | ICD-10-CM

## 2021-05-24 DIAGNOSIS — G8929 Other chronic pain: Secondary | ICD-10-CM

## 2021-05-24 MED ORDER — METHYLPREDNISOLONE SODIUM SUCC 125 MG IJ SOLR
INTRAMUSCULAR | Status: AC
  Filled 2021-05-24: qty 2

## 2021-05-24 MED ORDER — METHYLPREDNISOLONE SODIUM SUCC 125 MG IJ SOLR
60.0000 mg | Freq: Once | INTRAMUSCULAR | Status: AC
  Administered 2021-05-24: 60 mg via INTRAMUSCULAR

## 2021-05-24 MED ORDER — TIZANIDINE HCL 2 MG PO TABS
2.0000 mg | ORAL_TABLET | Freq: Three times a day (TID) | ORAL | 0 refills | Status: DC | PRN
  Filled 2021-05-24: qty 21, 7d supply, fill #0

## 2021-05-24 NOTE — Discharge Instructions (Addendum)
-  Start the muscle relaxer-Zanaflex (tizanidine), up to 3 times daily for muscle spasms and pain.  This can make you drowsy, so take at bedtime or when you do not need to drive or operate machinery. -Continue voltaren -PLEASE FOLLOW-UP WITH YOUR PCP FOR SPINE REFERRAL, ADDITIONAL WORK LIMITATIONS, ADDITIONAL PAIN MEDICATIONS.

## 2021-05-24 NOTE — ED Triage Notes (Signed)
Pt reports neck pain, right sided lower back pain, radiates to right leg x 10 years, worse in the past 5 days. Diclofenac 75 mg gives no relief.

## 2021-05-24 NOTE — ED Notes (Signed)
Discharge was an extensive process with translator and patient's many questions

## 2021-05-24 NOTE — ED Provider Notes (Signed)
[facility]    CSN: [id number] Arrival date & time: 05/24/21  1148      History   Chief Complaint Chief Complaint  Patient presents with   Back Pain   Leg Injury    HPI Candice [name] is a 45 y.o. female presenting for neck, back pain x10 years, but worse for the last 5 days. Medical history prediabetes, DJD lumbar spine. Unable to identify any triggers, denies recent trauma or overuse. Describes R shoulder pain, R lumbar paraspinous tenderness, and R sciatica. Consistent with previous symptoms. Worse with ambulation. Minimal improvement with voltaren. Requesting referral to spine specialists; injection; MRI; asking for work note stating she cannot return until October. Denies numbness in arms/legs, denies weakness in arms/legs, denies saddle anesthesia, denies bowel/bladder incontinence, denies urinary retention, denies constipation. States she could not be pregnant.    HPI  Past Medical History:  Diagnosis Date   Irritable bowel syndrome (IBS)    Prediabetes     Patient Active Problem List   Diagnosis Date Noted   Prediabetes 04/23/2018   History of gastroesophageal reflux (GERD) 08/03/2017   History of vitamin D deficiency 40/98/1191   History of Helicobacter pylori infection 08/03/2017   History of urinary tract infection 08/03/2017   Asymptomatic microscopic hematuria 08/03/2017   Chronic abdominal pain 08/03/2017   Left lower quadrant pain 06/05/2016   Hemorrhoid 04/06/2016   Neck pain 10/28/2015   Shoulder pain, bilateral 10/28/2015   Vertigo 07/29/2015   Tachycardia 07/08/2015   Vitamin D deficiency 04/08/2015   Lumbar back pain with radiculopathy affecting left lower extremity 04/07/2015   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   Chronic pain of multiple joints 01/29/2015   Irregular menses 10/01/2014   Allergic rhinitis 10/01/2014   Chalazion of left upper eyelid 10/01/2014   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 Authorizing Provider  diclofenac (VOLTAREN) 75 MG EC tablet Take 75 mg by mouth 2 (two) times daily.   Yes [provider]  tiZANidine (ZANAFLEX) 2 MG tablet Take 1 tablet (2 mg total) by mouth every 8 (eight) hours as needed for muscle spasms. 05/24/21  Yes Hazel Sams, PA-C  Benzocaine-Menthol (CEPACOL) 15-2.3 MG LOZG Take daily as prescribed for sore throat Patient not taking: No sig reported 08/18/20   Gildardo Pounds, NP  Blood Glucose Monitoring Suppl (ONETOUCH VERIO REFLECT) w/Device KIT use daily as directed  03/16/21   Charlott Rakes, MD  glucose blood (ONETOUCH VERIO) test strip Use as instructed 03/16/21   Charlott Rakes, MD  ibuprofen (ADVIL) 600 MG tablet Take 1 tablet (600 mg total) by mouth every 8 (eight) hours as needed.  Patient not taking: No sig reported 08/18/20   Gildardo Pounds, NP  ketoconazole (NIZORAL) 2 % cream Apply 1 application topically daily. Patient not taking: No sig reported 08/18/20   Gildardo Pounds, NP  ketoconazole (NIZORAL) 2 % cream APPLY 1 APPLICATION TOPICALLY DAILY. Patient not taking: No sig reported 08/18/20 08/18/21  Gildardo Pounds, NP  metFORMIN (GLUCOPHAGE) 500 MG tablet Take 1 tablet (500 mg total) by mouth daily with breakfast. 03/16/21   Charlott Rakes, MD    Family History Family History  Problem Relation Age of Onset   Hypertension Father     Social History Social History   Tobacco Use   Smoking status: Never   Smokeless tobacco:  Never  Vaping Use   Vaping Use: Never used  Substance Use Topics   Alcohol use: No   Drug use: No     Allergies   Patient has no known allergies.   Review of Systems Review of Systems  Musculoskeletal:  Positive for back pain.  All other systems reviewed and are negative.   Physical Exam Triage Vital Signs ED Triage Vitals  Enc Vitals Group     BP 05/24/21 1319 (!) 136/93     Pulse Rate 05/24/21 1319 94     Resp 05/24/21 1319 18     Temp 05/24/21 1319 98.3 F (36.8 C)     Temp Source 05/24/21 1319 Oral     SpO2 05/24/21 1319 100 %     Weight --      Height --      Head Circumference --      Peak Flow --      Pain Score 05/24/21 1314 10     Pain Loc --      Pain Edu? --      Excl. in [place]? --    No data found.  Updated Vital Signs BP (!) 136/93 (BP Location: Right Arm)   Pulse 94   Temp 98.3 F (36.8 C) (Oral)   Resp 18   SpO2 100%   Visual Acuity Right Eye Distance:   Left Eye Distance:   Bilateral Distance:    Right Eye Near:   Left Eye Near:    Bilateral Near:     Physical Exam Vitals reviewed.  Constitutional:      General: She is not in acute distress.    Appearance: Normal appearance. She is not ill-appearing.  HENT:     Head: Normocephalic and atraumatic.  Cardiovascular:     Rate and Rhythm: Normal rate and regular rhythm.     Heart sounds: Normal heart sounds.  Pulmonary:     Effort: Pulmonary effort is normal.     Breath sounds: Normal breath sounds and air entry.  Abdominal:     Tenderness: There is no abdominal tenderness. There is no right CVA tenderness, left CVA tenderness, guarding or rebound.  Musculoskeletal:     Cervical back: Normal range of motion. Spasms and tenderness present. No swelling, deformity, signs of trauma, rigidity, bony tenderness or crepitus. No pain with movement.     Thoracic back: No swelling, deformity, signs of trauma, spasms, tenderness or bony tenderness. Normal range of motion. No scoliosis.      Lumbar back: Spasms and tenderness present. No swelling, deformity, signs of trauma or bony tenderness. Normal range of motion. Positive right straight leg raise test. Negative left straight leg raise test. No scoliosis.     Comments: R cervical paraspinous  muscle tenderness to shallow  palpation. R proximal trapezius tenderness to palpation. R lumbar paraspinous muscle tenderness to palpation. Positive R straight leg raise. Exam limited due to patient discomfort, she is ambulating using wheelchair. No saddle anesthesia. Strength and sensation grossly intact upper and lower extremities. No midline spinous tenderness, deformity, stepoff.  Absolutely no other injury, deformity, tenderness, ecchymosis, abrasion.  Neurological:     General: No focal deficit present.     Mental Status: She is alert.     Cranial Nerves: No cranial nerve deficit.  Psychiatric:        Mood and Affect: Mood normal. Affect is tearful.        Behavior: Behavior normal.        Thought Content: Thought content normal.        Judgment: Judgment normal.     UC Treatments / Results  Labs (all labs ordered are listed, but only abnormal results are displayed) Labs Reviewed - No data to display   EKG   Radiology No results found.  Procedures Procedures (including critical care time)  Medications Ordered in UC Medications  methylPREDNISolone sodium succinate (SOLU-MEDROL) 125 mg/2 mL injection 60 mg (has no administration in time range)    Initial Impression / Assessment and Plan / UC Course  I have reviewed the triage vital signs and the nursing notes.  Pertinent labs & imaging results that were available during my care of the patient were reviewed by me and considered in my medical decision making (see chart for details).     This patient is a very pleasant 45 y.o. year old female presenting with chronic back pain. Diagnosis of DJD. No red flag symptoms today.   Voltaren providing minimal relief. MR  lumbar spine showing DJD 01/2015.  A1c 6.1 08/2020.  States she is not pregnant or breastfeeding.  Solumedrol IM, zanaflex, continue voltaren. STRICT ED return precautions and rec f/u with PCP. Given chronicity of symptoms, she will likely require referral to spine, which she can have done through PCP. Work note provided today.   ED return precautions discussed. Patient verbalizes understanding and agreement.   Spoke with this patient using language line.  Coding Level 4 based on time as I spent over 40 minutes with this patient using language line answering and eliciting MANY QUESTIONS, thorough physical exam, plan for follow-up and treatment.     Final Clinical Impressions(s) / UC Diagnoses   Final diagnoses:  Chronic right-sided low back pain with right-sided sciatica  Language barrier     Discharge Instructions      -Start the muscle relaxer-Zanaflex (tizanidine), up to 3 times daily for muscle spasms and pain.  This can make you drowsy, so take at bedtime or when you do not need to drive or operate machinery. -Continue voltaren -PLEASE FOLLOW-UP WITH YOUR PCP FOR SPINE REFERRAL, ADDITIONAL WORK LIMITATIONS, ADDITIONAL PAIN MEDICATIONS.      ED Prescriptions     Medication Sig Dispense Auth. Provider   tiZANidine (ZANAFLEX) 2 MG tablet Take 1 tablet (2 mg total) by mouth every 8 (eight) hours as needed for muscle spasms. 21 tablet Hazel Sams, PA-C      PDMP not reviewed this encounter.   Hazel Sams, PA-C 05/24/21 1423

## 2021-05-25 ENCOUNTER — Other Ambulatory Visit: Payer: Self-pay

## 2021-05-25 ENCOUNTER — Ambulatory Visit: Payer: 59 | Attending: Nurse Practitioner | Admitting: Nurse Practitioner

## 2021-05-25 VITALS — BP 96/64 | HR 94 | Ht 60.0 in | Wt 225.1 lb

## 2021-05-25 DIAGNOSIS — E669 Obesity, unspecified: Secondary | ICD-10-CM | POA: Diagnosis not present

## 2021-05-25 DIAGNOSIS — M5416 Radiculopathy, lumbar region: Secondary | ICD-10-CM | POA: Diagnosis not present

## 2021-05-25 DIAGNOSIS — E119 Type 2 diabetes mellitus without complications: Secondary | ICD-10-CM

## 2021-05-25 DIAGNOSIS — R7303 Prediabetes: Secondary | ICD-10-CM

## 2021-05-25 DIAGNOSIS — E1169 Type 2 diabetes mellitus with other specified complication: Secondary | ICD-10-CM | POA: Diagnosis not present

## 2021-05-25 DIAGNOSIS — D72829 Elevated white blood cell count, unspecified: Secondary | ICD-10-CM | POA: Diagnosis not present

## 2021-05-25 DIAGNOSIS — Z6841 Body Mass Index (BMI) 40.0 and over, adult: Secondary | ICD-10-CM

## 2021-05-25 MED ORDER — TIZANIDINE HCL 2 MG PO TABS
2.0000 mg | ORAL_TABLET | Freq: Three times a day (TID) | ORAL | 1 refills | Status: DC | PRN
  Filled 2021-05-25: qty 60, 20d supply, fill #0

## 2021-05-25 MED ORDER — METFORMIN HCL 500 MG PO TABS
500.0000 mg | ORAL_TABLET | Freq: Every day | ORAL | 1 refills | Status: DC
  Filled 2021-05-25: qty 90, 90d supply, fill #0

## 2021-05-25 MED ORDER — TIZANIDINE HCL 2 MG PO TABS
2.0000 mg | ORAL_TABLET | Freq: Three times a day (TID) | ORAL | 0 refills | Status: DC | PRN
  Filled 2021-05-25: qty 21, 7d supply, fill #0

## 2021-05-25 MED ORDER — GABAPENTIN 600 MG PO TABS
300.0000 mg | ORAL_TABLET | Freq: Three times a day (TID) | ORAL | 1 refills | Status: DC
  Filled 2021-05-25: qty 135, 90d supply, fill #0

## 2021-05-25 NOTE — Progress Notes (Signed)
Assessment & Plan:  Candice [name] was seen today for back pain.  Diagnoses and all orders for this visit:  Lumbar back pain with radiculopathy affecting left lower extremity -     gabapentin (NEURONTIN) 600 MG tablet; Take 0.5 tablets (300 mg total) by mouth 3 (three) times daily. -     DG Lumbar Spine Complete; Future -     Ambulatory referral to Orthopedic Surgery -     Work on losing weight to help reduce back pain. May alternate with heat and ice application for pain relief. May also alternate with acetaminophen and Ibuprofen as prescribed for back pain. Other alternatives include massage, acupuncture and water aerobics.  You must stay active and avoid a sedentary lifestyle.   Diabetes mellitus type 2 in obese (HCC) -     metFORMIN (GLUCOPHAGE) 500 MG tablet; Take 1 tablet (500 mg total) by mouth daily with breakfast. Continue blood sugar control as discussed in office today, low carbohydrate diet, and regular physical exercise as tolerated, 150 minutes per week (30 min each day, 5 days per week, or 50 min 3 days per week). Keep blood sugar logs with fasting goal of 90-130 mg/dl, post prandial (after you eat) less than 180.  For Hypoglycemia: BS <60 and Hyperglycemia BS >400; contact the clinic ASAP. Annual eye exams and foot exams are recommended.   Leukocytosis, unspecified type -     CMP14+EGFR -     CBC with Differential   Patient has been counseled on age-appropriate routine health concerns for screening and prevention. These are reviewed and up-to-date. Referrals have been placed accordingly. Immunizations are up-to-date or declined.    Subjective:   Chief Complaint  Patient presents with   Back Pain   HPI Candice [name] 45 y.o. [name] presents to office today for back pain  She has a past medical history of Irritable bowel syndrome (IBS) and Prediabetes.   Elevated blood pressure. Needs to work on dietary modification and daily exercise routine. Denies chest pain, shortness of  breath, palpitations, lightheadedness, dizziness, headaches or BLE edema.   BP Readings from Last 3 Encounters:  06/02/21 134/89  05/30/21 129/89  05/25/21 96/64     DM2 Well controlled with metformin 500 mg daily. She denies any symptoms of hypo or hyperglycemia.   Chronic Low Pain Onset several years ago after falling off a stool. Pain has been unimproved with NSAID and muscle relaxer. Today she reports pain in her low back with left sided sciatica.  Pain worse with sitting for prolonged periods of time. She endorses fecal and urinary urgency and no incontinence. MRI of low back  02/16/2015  1. 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 Mild left greater than right foraminal stenosis at each level. 2. Superimposed epidural lipomatosis     Review of Systems  Constitutional:  Negative for fever, malaise/fatigue and weight loss.  HENT: Negative.  Negative for nosebleeds.   Eyes: Negative.  Negative for blurred vision, double vision and photophobia.  Respiratory: Negative.  Negative for cough and shortness of breath.   Cardiovascular: Negative.  Negative for chest pain, palpitations and leg swelling.  Gastrointestinal: Negative.  Negative for heartburn, nausea and vomiting.  Musculoskeletal:  Positive for back pain and myalgias.  Neurological:  Positive for sensory change. Negative for dizziness, focal weakness, seizures and headaches.  Psychiatric/Behavioral: Negative.  Negative for suicidal ideas.    Past Medical History:  Diagnosis  Date   Irritable bowel syndrome (IBS)    Prediabetes     Past Surgical History:  Procedure Laterality Date   CIRCUMCISION  as a young girl    Type III femake circumcision    DILATION AND CURETTAGE OF UTERUS  2006   HEMORRHOID SURGERY  2015   HEMORRHOID SURGERY     in [place]    Family History  Problem Relation Age of Onset    Hypertension Father     Social History Reviewed with no changes to be made today.   Outpatient Medications Prior to Visit  Medication Sig Dispense Refill   Blood Glucose Monitoring Suppl (ONETOUCH VERIO REFLECT) w/Device KIT use daily as directed  1 kit 0   glucose blood (ONETOUCH VERIO) test strip Use as instructed 100 each 12   ketoconazole (NIZORAL) 2 % cream Apply 1 application topically daily. 60 g 1   ketoconazole (NIZORAL) 2 % cream APPLY 1 APPLICATION TOPICALLY DAILY. 60 g 1   diclofenac (VOLTAREN) 75 MG EC tablet Take 75 mg by mouth 2 (two) times daily.     ibuprofen (ADVIL) 600 MG tablet Take 1 tablet (600 mg total) by mouth every 8 (eight) hours as needed. 60 tablet 1   metFORMIN (GLUCOPHAGE) 500 MG tablet Take 1 tablet (500 mg total) by mouth daily with breakfast. 90 tablet 1   tiZANidine (ZANAFLEX) 2 MG tablet Take 1 tablet (2 mg total) by mouth every 8 (eight) hours as needed for muscle spasms. 21 tablet 0   Benzocaine-Menthol (CEPACOL) 15-2.3 MG LOZG Take daily as prescribed for sore throat (Patient not taking: No sig reported) 16 lozenge 1   No facility-administered medications prior to visit.    No Known Allergies     Objective:    BP 96/64   Pulse 94   Ht 5' (1.524 m)   Wt 225 lb 2 oz (102.1 kg)   SpO2 100%   BMI 43.97 kg/m  Wt Readings from Last 3 Encounters:  06/02/21 225 lb 2.1 oz (102.1 kg)  05/25/21 225 lb 2 oz (102.1 kg)  03/16/21 225 lb 9.6 oz (102.3 kg)    Physical Exam Vitals and nursing note reviewed.  Constitutional:      Appearance: She is well-developed.  HENT:     Head: Normocephalic and atraumatic.  Cardiovascular:     Rate and Rhythm: Normal rate and regular rhythm.     Heart sounds: Normal heart sounds. No murmur heard.   No friction rub. No gallop.  Pulmonary:     Effort: Pulmonary effort is normal. No tachypnea or respiratory distress.     Breath sounds: Normal breath sounds. No decreased breath sounds, wheezing, rhonchi or  rales.  Chest:     Chest wall: No tenderness.  Abdominal:     General: Bowel sounds are normal.     Palpations: Abdomen is soft.  Musculoskeletal:        General: Normal range of motion.     Cervical back: Normal range of motion.     Lumbar back: Spasms and tenderness present. No swelling or deformity.  Skin:    General: Skin is warm and dry.  Neurological:     Mental Status: She is alert and oriented to person, place, and time.     Coordination: Coordination normal.  Psychiatric:        Behavior: Behavior normal. Behavior is cooperative.        Thought Content: Thought content normal.  Judgment: Judgment normal.         Patient has been counseled extensively about nutrition and exercise as well as the importance of adherence with medications and regular follow-up. The patient was given clear instructions to go to ER or return to medical center if symptoms don't improve, worsen or new problems develop. The patient verbalized understanding.   Follow-up: Return for  [place] any tuesday in october. NEEDS labs in 4 weeks for A1c.   Gildardo Pounds, FNP-BC [facility] and [facility] [place], [place]   06/05/2021, 1:39 PM

## 2021-05-25 NOTE — Progress Notes (Signed)
Candice [name] [id number]

## 2021-05-26 LAB — CMP14+EGFR
ALT: 9 IU/L (ref 0–32)
AST: 10 IU/L (ref 0–40)
Albumin/Globulin Ratio: 1.3 (ref 1.2–2.2)
Albumin: 3.9 g/dL (ref 3.8–4.8)
Alkaline Phosphatase: 104 IU/L (ref 44–121)
BUN/Creatinine Ratio: 24 — ABNORMAL HIGH (ref 9–23)
BUN: 17 mg/dL (ref 6–24)
Bilirubin Total: <0.2 mg/dL (ref 0.0–1.2)
CO2: 19 mmol/L — ABNORMAL LOW (ref 20–29)
Calcium: 10.2 mg/dL (ref 8.7–10.2)
Chloride: 96 mmol/L (ref 96–106)
Creatinine, Ser: 0.7 mg/dL (ref 0.57–1.00)
Globulin, Total: 3 g/dL (ref 1.5–4.5)
Glucose: 187 mg/dL — ABNORMAL HIGH (ref 65–99)
Potassium: 4.6 mmol/L (ref 3.5–5.2)
Sodium: 133 mmol/L — ABNORMAL LOW (ref 134–144)
Total Protein: 6.9 g/dL (ref 6.0–8.5)
eGFR: 111 mL/min/{1.73_m2} (ref >59)

## 2021-05-26 LAB — CBC WITH DIFFERENTIAL/PLATELET
Basophils Absolute: 0 10*3/uL (ref 0.0–0.2)
Basos: 0 %
EOS (ABSOLUTE): 0 10*3/uL (ref 0.0–0.4)
Eos: 0 %
Hematocrit: 40.7 % (ref 34.0–46.6)
Hemoglobin: 12.7 g/dL (ref 11.1–15.9)
Immature Grans (Abs): 0.2 10*3/uL — ABNORMAL HIGH (ref 0.0–0.1)
Immature Granulocytes: 1 %
Lymphocytes Absolute: 2.3 10*3/uL (ref 0.7–3.1)
Lymphs: 13 %
MCH: 26.3 pg — ABNORMAL LOW (ref 26.6–33.0)
MCHC: 31.2 g/dL — ABNORMAL LOW (ref 31.5–35.7)
MCV: 84 fL (ref 79–97)
Monocytes Absolute: 0.6 10*3/uL (ref 0.1–0.9)
Monocytes: 3 %
Neutrophils Absolute: 15 10*3/uL — ABNORMAL HIGH (ref 1.4–7.0)
Neutrophils: 83 %
Platelets: 384 10*3/uL (ref 150–450)
RBC: 4.82 x10E6/uL (ref 3.77–5.28)
RDW: 13.3 % (ref 11.7–15.4)
WBC: 18.1 10*3/uL — ABNORMAL HIGH (ref 3.4–10.8)

## 2021-05-30 ENCOUNTER — Ambulatory Visit (HOSPITAL_COMMUNITY)
Admission: EM | Admit: 2021-05-30 | Discharge: 2021-05-30 | Disposition: A | Discharge status: Home / Self Care | Payer: 59 | Attending: Emergency Medicine | Admitting: Emergency Medicine

## 2021-05-30 ENCOUNTER — Other Ambulatory Visit: Payer: Self-pay

## 2021-05-30 ENCOUNTER — Encounter (HOSPITAL_COMMUNITY): Payer: Self-pay | Admitting: *Deleted

## 2021-05-30 DIAGNOSIS — M5416 Radiculopathy, lumbar region: Secondary | ICD-10-CM

## 2021-05-30 MED ORDER — METHYLPREDNISOLONE SODIUM SUCC 125 MG IJ SOLR
60.0000 mg | Freq: Once | INTRAMUSCULAR | Status: AC
  Administered 2021-05-30: 60 mg via INTRAMUSCULAR

## 2021-05-30 MED ORDER — PREDNISONE 20 MG PO TABS
40.0000 mg | ORAL_TABLET | Freq: Every day | ORAL | 0 refills | Status: DC
  Filled 2021-05-30: qty 10, 5d supply, fill #0

## 2021-05-30 MED ORDER — METHYLPREDNISOLONE SODIUM SUCC 125 MG IJ SOLR: 80.0000 mg | Freq: Once | INTRAMUSCULAR | Status: DC

## 2021-05-30 MED ORDER — METHYLPREDNISOLONE SODIUM SUCC 125 MG IJ SOLR
INTRAMUSCULAR | Status: AC
  Filled 2021-05-30: qty 2

## 2021-05-30 MED ORDER — TIZANIDINE HCL 2 MG PO TABS
2.0000 mg | ORAL_TABLET | Freq: Three times a day (TID) | ORAL | 0 refills | Status: AC | PRN
  Filled 2021-05-30: qty 45, 15d supply, fill #0

## 2021-05-30 MED ORDER — MELOXICAM 7.5 MG PO TABS
7.5000 mg | ORAL_TABLET | Freq: Every day | ORAL | 0 refills | Status: DC
  Filled 2021-05-30: qty 30, 30d supply, fill #0

## 2021-05-30 MED ORDER — KETOROLAC TROMETHAMINE 30 MG/ML IJ SOLN
INTRAMUSCULAR | Status: AC
  Filled 2021-05-30: qty 1

## 2021-05-30 MED ORDER — KETOROLAC TROMETHAMINE 30 MG/ML IJ SOLN
30.0000 mg | Freq: Once | INTRAMUSCULAR | Status: AC
  Administered 2021-05-30: 30 mg via INTRAMUSCULAR

## 2021-05-30 NOTE — Discharge Instructions (Addendum)
Please do not miss appointment with orthopedic doctor   starting tomorrow begin taking prednisone each morning with food for 5 days  Once competed can use meloxicam every morning with food  Can continue use of tinizadine as needed every 6 hours  Your pain is most likely caused by irritation to the muscles or ligaments.   You may use heating pad in 15 minute intervals as needed for additional comfort, within the first 2-3 days you may find comfort in using ice in 10-15 minutes over affected area  Begin stretching affected area daily for 10 minutes as tolerated to further loosen muscles   When lying down place pillow underneath and between knees for support  Can try sleeping without pillow on firm mattress   Practice good posture: head back, shoulders back, chest forward, pelvis back and weight distributed evenly on both legs

## 2021-05-30 NOTE — ED Provider Notes (Signed)
[facility]    CSN: [id number] Arrival date & time: 05/30/21  1003      History   Chief Complaint Chief Complaint  Patient presents with   Leg Pain   Leg Swelling    HPI Candice [name] is a 45 y.o. female.   Patient presents with chronic mid low back pain radiating down right leg, present for 10 years but has worsened over the last 3 weeks. Worsened with long periods of sitting, twisting, turning and bending. Denies numbness, tingling, recent injury or trauma, urinary changes. Has upcoming ortho appointment 9/22.  History of DDD, DM type 2. Has been using Zanaflex consistently with some relief.   Past Medical History:  Diagnosis Date   Irritable bowel syndrome (IBS)    Prediabetes     Patient Active Problem List   Diagnosis Date Noted   Prediabetes 04/23/2018   History of gastroesophageal reflux (GERD) 08/03/2017   History of vitamin D deficiency 86/76/7209   History of Helicobacter pylori infection 08/03/2017   History of urinary tract infection 08/03/2017   Asymptomatic microscopic hematuria 08/03/2017   Chronic abdominal pain 08/03/2017   Left lower quadrant pain 06/05/2016   Hemorrhoid 04/06/2016   Neck pain 10/28/2015   Shoulder pain, bilateral 10/28/2015   Vertigo 07/29/2015   Tachycardia 07/08/2015   Vitamin D deficiency 04/08/2015   Lumbar back pain with radiculopathy affecting left lower extremity 04/07/2015   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   Chronic pain of multiple joints 01/29/2015   Irregular menses 10/01/2014   Allergic rhinitis 10/01/2014   Chalazion of left upper eyelid 10/01/2014   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 Authorizing Provider  meloxicam (MOBIC) 7.5 MG tablet Take 1 tablet (7.5 mg total) by mouth daily. 05/30/21 06/29/21 Yes Akif Weldy R, NP  predniSONE (DELTASONE) 20 MG tablet Take 2 tablets (40 mg total) by mouth daily. 05/30/21  Yes Alyshia Kernan R, NP  Blood Glucose Monitoring Suppl (ONETOUCH VERIO REFLECT) w/Device KIT use daily as directed  03/16/21   Charlott Rakes, MD  gabapentin (NEURONTIN) 600 MG tablet Take 0.5 tablets (300 mg total) by mouth 3 (three) times daily. 05/25/21 08/23/21  Gildardo Pounds, NP  glucose blood (ONETOUCH VERIO) test strip Use as instructed 03/16/21   Charlott Rakes, MD  ketoconazole (NIZORAL) 2 % cream Apply 1 application topically daily. 08/18/20   Gildardo Pounds, NP  ketoconazole (NIZORAL) 2 % cream APPLY 1 APPLICATION TOPICALLY DAILY. 08/18/20 08/18/21  Gildardo Pounds, NP  metFORMIN (GLUCOPHAGE) 500 MG tablet Take 1 tablet (500 mg total) by mouth daily with breakfast. 05/25/21   Gildardo Pounds, NP  tiZANidine (ZANAFLEX) 2 MG tablet Take 1 tablet (2 mg total) by mouth every 8 (eight) hours as needed for up to 15 days for muscle spasms. 05/30/21 06/14/21  Hans Eden, NP    Family History Family History  Problem Relation Age of Onset   Hypertension Father     Social History Social History   Tobacco Use   Smoking status: Never   Smokeless tobacco: Never  Vaping Use   Vaping Use: Never used  Substance Use Topics   Alcohol use: No   Drug use: No     Allergies   Patient has no known allergies.   Review of Systems Review of Systems  Constitutional: Negative.   Respiratory: Negative.    Cardiovascular: Negative.   Gastrointestinal: Negative.   Genitourinary: Negative.   Musculoskeletal:  Positive for back pain. Negative for  arthralgias, gait problem, joint swelling, myalgias, neck pain and neck stiffness.  Skin: Negative.     Physical Exam Triage Vital Signs ED Triage Vitals  Enc Vitals Group     BP 05/30/21 1055 129/89     Pulse Rate 05/30/21 1055 (!) 108     Resp 05/30/21 1055 20     Temp 05/30/21 1055 97.8 F (36.6 C)     Temp src --      SpO2 05/30/21 1055 93 %     Weight --      Height --      Head Circumference --      Peak Flow --      Pain Score 05/30/21 1100 10     Pain Loc --      Pain Edu? --      Excl. in [place]? --    No data found.  Updated Vital Signs BP 129/89   Pulse (!) 108   Temp 97.8 F (36.6 C)   Resp 20   SpO2 93%   Visual Acuity Right Eye Distance:   Left Eye Distance:   Bilateral Distance:    Right Eye Near:   Left Eye Near:    Bilateral Near:     Physical Exam Constitutional:      Appearance: Normal appearance. She is normal weight.  HENT:     Head: Normocephalic.  Eyes:     Extraocular Movements: Extraocular movements intact.  Pulmonary:     Effort: Pulmonary effort is normal.  Musculoskeletal:     Comments: Tenderness beginning in cervical spine extending down midline through thoracic and lumbar regions, range in motion intact eliciting pain, no ecchymosis, erythema present  Skin:    General: Skin is warm and dry.  Neurological:     Mental Status: She is alert and oriented to person, place, and time. Mental status is at baseline.  Psychiatric:        Mood and Affect: Mood normal.        Behavior: Behavior normal.     UC Treatments / Results  Labs (all labs ordered are listed, but only abnormal results are displayed) Labs Reviewed - No data to display  EKG   Radiology No results found.  Procedures Procedures (including critical care time)  Medications Ordered in UC Medications  ketorolac (TORADOL) 30 MG/ML injection 30 mg (30 mg Intramuscular Given 05/30/21 1143)  methylPREDNISolone sodium succinate (SOLU-MEDROL) 125 mg/2 mL injection  60 mg (60 mg Intramuscular Given 05/30/21 1143)    Initial Impression / Assessment and Plan / UC Course  I have reviewed the triage vital signs and the nursing notes.  Pertinent labs & imaging results that were available during my care of the patient were reviewed by me and considered in my medical decision making (see chart for details).  Lumbar back pain with radiculopathy affecting right lower extremity  Symptomology is chronic with no red flags today with upcoming Ortho appointment,degenerative disease confirmed via MRI in 2016, will manage conservatively, patient in agreement with plan  Toradol 30 mg IM now, methylprednisolone 60 mg IM now Prednisone 40 mg daily with food for 5 days has type 2 diabetes, discussed adverse effect of hyperglycemia, patient monitors sugars at home, reports postprandial CBG less than 180, last A1c level 6.6, diabetes is stable and controlled, patient in agreement to monitor sugars daily Meloxicam 7.5 mg daily to begin after completion of steroid course Stressed importance of not missing appointment with orthopedic specialist Advised heating or ice for comfort, pillows for support stretching as tolerated  Final Clinical Impressions(s) / UC Diagnoses   Final diagnoses:  Lumbar back pain with radiculopathy affecting right lower extremity  Lumbar back pain with radiculopathy affecting left lower extremity     Discharge Instructions      Please do not miss appointment with orthopedic doctor   starting tomorrow begin taking prednisone each morning with food for 5 days  Once competed can use meloxicam every morning with food  Can continue use of tinizadine as needed every 6 hours  Your pain is most likely caused by irritation to the muscles or ligaments.   You may use heating pad in 15 minute intervals as needed for additional comfort, within the first 2-3 days you may find comfort in using ice in 10-15 minutes over affected area  Begin stretching  affected area daily for 10 minutes as tolerated to further loosen muscles   When lying down place pillow underneath and between knees for support  Can try sleeping without pillow on firm mattress   Practice good posture: head back, shoulders back, chest forward, pelvis back and weight distributed evenly on both legs       ED Prescriptions     Medication Sig Dispense Auth. Provider   tiZANidine (ZANAFLEX) 2 MG tablet Take 1 tablet (2 mg total) by mouth every 8 (eight) hours as needed for up to 15 days for muscle spasms. 45 tablet Richards Pherigo R, NP   predniSONE (DELTASONE) 20 MG tablet Take 2 tablets (40 mg total) by mouth daily. 10 tablet Joleene Burnham, Vincente Liberty R, NP   meloxicam (MOBIC) 7.5 MG tablet Take 1 tablet (7.5 mg total) by mouth daily. 30 tablet Hans Eden, NP      PDMP not reviewed this encounter.   Hans Eden, NP 05/30/21 1156

## 2021-05-30 NOTE — ED Triage Notes (Signed)
Pt continues to have Rt leg pain last seen on Sept 6. Pt taking tizanidine and has to take every day.Pt has a appt with ortho .

## 2021-06-02 ENCOUNTER — Other Ambulatory Visit: Payer: Self-pay

## 2021-06-02 ENCOUNTER — Ambulatory Visit (INDEPENDENT_AMBULATORY_CARE_PROVIDER_SITE_OTHER): Payer: 59 | Admitting: Surgery

## 2021-06-02 ENCOUNTER — Ambulatory Visit: Payer: Self-pay

## 2021-06-02 ENCOUNTER — Telehealth: Payer: Self-pay | Admitting: Nurse Practitioner

## 2021-06-02 ENCOUNTER — Encounter: Payer: Self-pay | Admitting: Surgery

## 2021-06-02 VITALS — BP 134/89 | HR 102 | Ht 60.0 in | Wt 225.1 lb

## 2021-06-02 DIAGNOSIS — R29898 Other symptoms and signs involving the musculoskeletal system: Secondary | ICD-10-CM

## 2021-06-02 DIAGNOSIS — G8929 Other chronic pain: Secondary | ICD-10-CM

## 2021-06-02 DIAGNOSIS — M5441 Lumbago with sciatica, right side: Secondary | ICD-10-CM | POA: Diagnosis not present

## 2021-06-02 DIAGNOSIS — M4726 Other spondylosis with radiculopathy, lumbar region: Secondary | ICD-10-CM

## 2021-06-02 NOTE — Progress Notes (Signed)
Office Visit Note   Patient: Candice [name]           Date of Birth: [birth year]           MRN: [id number] Visit Date: 06/02/2021              Requested by: Claiborne Rigg, NP [address] [place],  [place] [address] PCP: Claiborne Rigg, NP   Assessment & Plan: Visit Diagnoses:  1. Chronic right-sided low back pain with right-sided sciatica   2. Weakness of right leg   3. Other spondylosis with radiculopathy, lumbar region     Plan: With patient's ongoing and worsening low back pain with right greater than left lower extremity radiculopathy and feeling of right leg weakness with right foot drop I will schedule stat lumbar MRI to rule out HNP/stenosis.  This can be compared to study that was done in 2016.  Patient has failed multiple modes of conservative treatment.  I advised patient that the weakness that she has in her right leg is a concern.  No new medications prescribed today.  She will follow-up with Dr. Otelia Sergeant to review her MRI in a couple weeks.  Follow-Up Instructions: Return in about 2 weeks (around 06/16/2021) for to review stat lumbar mri.  worsening LBP, LE radiculopathy and right drop foot.   Orders:  Orders Placed This Encounter  Procedures   XR Lumbar Spine 2-3 Views   MR Lumbar Spine w/o contrast   No orders of the defined types were placed in this encounter.     Procedures: No procedures performed   Clinical Data: No additional findings.   Subjective: Chief Complaint  Patient presents with   Lower Back - Pain    HPI 45 year old female who is new patient to clinic comes in today with worsening complaints of low back pain, right greater than left lower extremity radiculopathy, right leg weakness and unsteady gait.  Comes in with an interpreter.  Patient has had problems with her low back for several years.  She did have a lumbar MRI scan ordered by a primary care provider and this was performed Feb 16, 2015.  That study showed: Narrative &  Impression  CLINICAL DATA:  45 year old female with fall 2 months ago and subsequent lumbar back pain radiating to the left calf. Symptoms increase with ambulation. Initial encounter.   EXAM: MRI LUMBAR SPINE WITHOUT CONTRAST   TECHNIQUE: Multiplanar, multisequence MR imaging of the lumbar spine was performed. No intravenous contrast was administered.   COMPARISON:  Lumbar radiographs 12/04/2014.   FINDINGS: Normal lumbar segmentation demonstrated on the comparison. Stable vertebral height and alignment with straightening of lumbar lordosis. No marrow edema or evidence of acute osseous abnormality.   Visualized lower thoracic spinal cord is normal with conus medularis at T12 all 1.   Large body habitus. Visualized abdominal viscera and paraspinal soft tissues are within normal limits. Negative visualized pelvic viscera.   T11-T12:  Negative.   T12-L1:  Negative.   L1-L2:  Negative.   L2-L3:  Negative.   L3-L4: Mild disc desiccation and circumferential disc bulge. Mild facet hypertrophy. Mild epidural lipomatosis. No stenosis.   L4-L5: Disc desiccation and disc space loss. Circumferential disc osteophyte complex with superimposed broad-based central disc protrusion with annular fissure (series 5, image 22). Mild facet and ligament flavum hypertrophy. Mild spinal stenosis, exacerbated by epidural lipomatosis just below the disc space level. Mild lateral recess and mild left L4 foraminal stenosis.   L5-S1: Disc desiccation and  disc space loss. Circumferential disc osteophyte complex with left paracentral broad-based posterior component with annular fissure (series 5, image 26). Superimposed epidural lipomatosis effacing CSF from the thecal sac at this level. Mild to moderate left lateral recess stenosis. Mild left greater than right L5 foraminal stenosis.   IMPRESSION: 1. Disc degeneration at L4-L5 and L5-S1 with small disc protrusions contributing to multifactorial  mild to moderate spinal and/or lateral recess stenosis. Mild left greater than right foraminal stenosis at each level. 2. Superimposed epidural lipomatosis.     Electronically Signed   By: Odessa Fleming M.D.   On: 02/16/2015 15:08   Patient states that she did not have a referral to a back specialist after the study was done and had no further treatment.  Current primary care provider has been managing this with medications.  Patient was seen in the urgent care May 24, 2021 and May 30, 2021.  September 6 she was given a Depo Medrol IM injection and on the September 12 she was given a prednisone taper, Toradol and Depo-Medrol IM injections.  She has also been treated in the past with gabapentin, Mobic and naproxen.  She has not had any improvement of her symptoms.  Currently she describes having worsening bilateral low back pain with radiation of her pain down the right leg.  States that pain is constant at times but aggravated with activity.  She also gets some radicular pain on the left but not as severe.  She feels like she has some issues with her balance.  She also describes some feeling of right dropfoot symptoms.  No complaints of bowel or bladder incontinence.  Review of Systems No current cardiac pulmonary GI GU issues  Objective: Vital Signs: BP 134/89   Pulse (!) 102   Ht 5' (1.524 m)   Wt 225 lb 2.1 oz (102.1 kg)   BMI 43.97 kg/m   Physical Exam Constitutional:      Appearance: She is obese.  HENT:     Head: Normocephalic.  Eyes:     Extraocular Movements: Extraocular movements intact.  Pulmonary:     Effort: Pulmonary effort is normal.  Musculoskeletal:     Comments: Gait is antalgic.  Patient has moderate to marked right lumbar paraspinal tenderness/spasm, right-sided notch tenderness, right hip greater trochanter bursa tenderness.  Nontender on the left side.  Negative logroll bilateral hips.  Positive right greater than left straight leg raise.  Patient does have  right anterior tib, EHL and gastroc weakness.  Neurological:     Mental Status: She is alert and oriented to person, place, and time.  Psychiatric:        Mood and Affect: Mood normal.    Ortho Exam  Specialty Comments:  No specialty comments available.  Imaging: No results found.   PMFS History: Patient Active Problem List   Diagnosis Date Noted   Prediabetes 04/23/2018   History of gastroesophageal reflux (GERD) 08/03/2017   History of vitamin D deficiency 08/03/2017   History of Helicobacter pylori infection 08/03/2017   History of urinary tract infection 08/03/2017   Asymptomatic microscopic hematuria 08/03/2017   Chronic abdominal pain 08/03/2017   Left lower quadrant pain 06/05/2016   Hemorrhoid 04/06/2016   Neck pain 10/28/2015   Shoulder pain, bilateral 10/28/2015   Vertigo 07/29/2015   Tachycardia 07/08/2015   Vitamin D deficiency 04/08/2015   Lumbar back pain with radiculopathy affecting left lower extremity 04/07/2015   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   Chronic  pain of multiple joints 01/29/2015   Irregular menses 10/01/2014   Allergic rhinitis 10/01/2014   Chalazion of left upper eyelid 10/01/2014   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   Past Medical History:  Diagnosis Date   Irritable bowel syndrome (IBS)    Prediabetes     Family History  Problem Relation Age of Onset   Hypertension Father     Past Surgical History:  Procedure Laterality Date   CIRCUMCISION  as a young girl    Type III femake circumcision    DILATION AND CURETTAGE OF UTERUS  2006   HEMORRHOID SURGERY  2015   HEMORRHOID SURGERY     in [place]   Social History   Occupational History   Not on file  Tobacco Use   Smoking status: Never   Smokeless tobacco: Never  Vaping Use   Vaping Use: Never used  Substance and Sexual Activity   Alcohol use: No   Drug use: No   Sexual activity: Yes

## 2021-06-02 NOTE — Telephone Encounter (Signed)
Pt is calling back because she missed a call Please advise CB- [phone]

## 2021-06-05 ENCOUNTER — Encounter: Payer: Self-pay | Admitting: Nurse Practitioner

## 2021-06-06 ENCOUNTER — Ambulatory Visit: Payer: 59 | Attending: Nurse Practitioner

## 2021-06-06 ENCOUNTER — Other Ambulatory Visit: Payer: Self-pay | Admitting: Nurse Practitioner

## 2021-06-06 ENCOUNTER — Other Ambulatory Visit: Payer: Self-pay

## 2021-06-06 DIAGNOSIS — Z1159 Encounter for screening for other viral diseases: Secondary | ICD-10-CM

## 2021-06-06 DIAGNOSIS — D72829 Elevated white blood cell count, unspecified: Secondary | ICD-10-CM

## 2021-06-07 ENCOUNTER — Telehealth: Payer: Self-pay | Admitting: Surgery

## 2021-06-07 ENCOUNTER — Ambulatory Visit
Admission: RE | Admit: 2021-06-07 | Discharge: 2021-06-07 | Disposition: A | Discharge status: Home / Self Care | Payer: 59 | Source: Ambulatory Visit | Attending: Surgery | Admitting: Surgery

## 2021-06-07 ENCOUNTER — Other Ambulatory Visit: Payer: Self-pay | Admitting: Nurse Practitioner

## 2021-06-07 DIAGNOSIS — M4726 Other spondylosis with radiculopathy, lumbar region: Secondary | ICD-10-CM

## 2021-06-07 DIAGNOSIS — R29898 Other symptoms and signs involving the musculoskeletal system: Secondary | ICD-10-CM

## 2021-06-07 DIAGNOSIS — G8929 Other chronic pain: Secondary | ICD-10-CM

## 2021-06-07 DIAGNOSIS — D72829 Elevated white blood cell count, unspecified: Secondary | ICD-10-CM

## 2021-06-07 LAB — CBC WITH DIFFERENTIAL/PLATELET
Basophils Absolute: 0.1 10*3/uL (ref 0.0–0.2)
Basos: 0 %
EOS (ABSOLUTE): 0.3 10*3/uL (ref 0.0–0.4)
Eos: 2 %
Hematocrit: 44.1 % (ref 34.0–46.6)
Hemoglobin: 13.8 g/dL (ref 11.1–15.9)
Immature Grans (Abs): 0.2 10*3/uL — ABNORMAL HIGH (ref 0.0–0.1)
Immature Granulocytes: 2 %
Lymphocytes Absolute: 2.2 10*3/uL (ref 0.7–3.1)
Lymphs: 15 %
MCH: 26.2 pg — ABNORMAL LOW (ref 26.6–33.0)
MCHC: 31.3 g/dL — ABNORMAL LOW (ref 31.5–35.7)
MCV: 84 fL (ref 79–97)
Monocytes Absolute: 0.5 10*3/uL (ref 0.1–0.9)
Monocytes: 3 %
Neutrophils Absolute: 11 10*3/uL — ABNORMAL HIGH (ref 1.4–7.0)
Neutrophils: 78 %
Platelets: 399 10*3/uL (ref 150–450)
RBC: 5.26 x10E6/uL (ref 3.77–5.28)
RDW: 13.5 % (ref 11.7–15.4)
WBC: 14.2 10*3/uL — ABNORMAL HIGH (ref 3.4–10.8)

## 2021-06-07 LAB — HCV INTERPRETATION

## 2021-06-07 LAB — HCV AB W REFLEX TO QUANT PCR: HCV Ab: <0.1 s/co ratio (ref 0.0–0.9)

## 2021-06-07 NOTE — Telephone Encounter (Signed)
Called pt and got them sch'd on 06/09/21 per [facility]

## 2021-06-07 NOTE — Telephone Encounter (Signed)
Pt's brother calling on behalf of the pt. Stated pt has received her mri this morning but it in a lot of pain and wanted to know if she can be seen any sooner to have that read. Pt saw Barry Dienes but is currently sch to see him again on the 29th of this month (not sure why pt is sch with Barry Dienes and not [place] or Ophelia Charter for mri review). The best call back number is [phone] (pt's brother's cell phone).

## 2021-06-09 ENCOUNTER — Ambulatory Visit (INDEPENDENT_AMBULATORY_CARE_PROVIDER_SITE_OTHER): Payer: 59 | Admitting: Specialist

## 2021-06-09 ENCOUNTER — Other Ambulatory Visit: Payer: Self-pay

## 2021-06-09 ENCOUNTER — Encounter: Payer: Self-pay | Admitting: Specialist

## 2021-06-09 VITALS — BP 135/90 | HR 100 | Ht 60.0 in | Wt 226.0 lb

## 2021-06-09 DIAGNOSIS — M5116 Intervertebral disc disorders with radiculopathy, lumbar region: Secondary | ICD-10-CM | POA: Diagnosis not present

## 2021-06-09 DIAGNOSIS — M5136 Other intervertebral disc degeneration, lumbar region: Secondary | ICD-10-CM | POA: Diagnosis not present

## 2021-06-09 DIAGNOSIS — M5416 Radiculopathy, lumbar region: Secondary | ICD-10-CM | POA: Diagnosis not present

## 2021-06-09 MED ORDER — TRAMADOL HCL 50 MG PO TABS: 50.0000 mg | ORAL_TABLET | Freq: Four times a day (QID) | ORAL | 0 refills | Status: AC | PRN

## 2021-06-09 NOTE — Progress Notes (Signed)
Office Visit Note   Patient: Candice [name]           Date of Birth: [birth year]           MRN: [id number] Visit Date: 06/09/2021              Requested by: Claiborne Rigg, NP [address] [place],  [place] [address] PCP: Claiborne Rigg, NP   Assessment & Plan: Visit Diagnoses:  1. Subacute right lumbar radiculopathy   2. Degenerative disc disease, lumbar   3. Herniation of lumbar intervertebral disc with radiculopathy     Plan: Avoid bending, stooping and avoid lifting weights greater than 10 lbs. Avoid prolong standing and walking. Avoid frequent bending and stooping  No lifting greater than 10 lbs. May use ice or moist heat for pain. Weight loss is of benefit. Handicap license is approved. Dr. Mosses Blas secretary/Assistant will call to arrange for epidural steroid injection   Tramadol for pain control. Referral for ESI and if this improves the pain then consider PT.   Follow-Up Instructions: No follow-ups on file.   Orders:  No orders of the defined types were placed in this encounter.  No orders of the defined types were placed in this encounter.     Procedures: No procedures performed   Clinical Data: Findings:  Narrative & Impression CLINICAL DATA:  Low back pain for greater than 6 weeks which is progressive. Pain worse on the right than the left with lower extremity radiculopathy.  EXAM: MRI LUMBAR SPINE WITHOUT CONTRAST  TECHNIQUE: Multiplanar, multisequence MR imaging of the lumbar spine was performed. No intravenous contrast was administered.  COMPARISON:  Radiography 06/02/2021.  MRI 02/16/2015.  FINDINGS: Segmentation:  5 lumbar type vertebral bodies.  Alignment:  Minimal scoliotic curvature convex to the right.  Vertebrae:  No fracture or focal lesion.  Conus medullaris and cauda equina: Conus extends to the T12-L1 level. Conus and cauda equina appear normal.  Paraspinal and other soft tissues: Negative  Disc  levels:  No abnormality at L2-3 or above.  L3-4: Mild bulging of the disc. No compressive stenosis. No change since 01/04/2015.  L4-5: Disc degeneration with loss of disc height. Chronic shallow disc protrusion with slight caudal down turning. Slight indentation of the thecal sac and mild stenosis of the lateral recesses but without definite neural compression. No progressive change since 01/04/15. The disc material has desiccated slightly.  L5-S1: Disc degeneration with bulging of the disc and a shallow protrusion towards the left. Mild facet and ligamentous hypertrophy. Stenosis of the subarticular lateral recess on the left that could possibly affect the left S1 nerve. The left subarticular lateral recess stenosis may be slightly more pronounced than was seen previously. The disc material has desiccated slightly.  IMPRESSION: L3-4: Minor, non-compressive disc bulge, unchanged.  L4-5: Shallow disc protrusion with slight caudal down turning. Slight indentation of the thecal sac and mild stenosis of both lateral recesses but no definite neural compression. No worsening since 01-04-2015. The disc material has desiccated slightly.  L5-S1: Shallow disc protrusion slightly more prominent towards the left. Stenosis of the subarticular lateral recess on the left that could possibly affect the left S1 nerve. The subarticular lateral recess narrowing may be slightly more pronounced than was seen in 01/04/2015. The disc material at this level has also desiccated slightly.   Electronically Signed   By: Paulina Fusi M.D.   On: 06/07/2021 08:06     Subjective: Chief Complaint  Patient presents with  .  Lower Back - Pain    45 year old female with 3 week history of LBP and radiation into the right leg posteriorly. Pain with worse with bending and stooping and lifting. She saw Andee Lineman 2 weeks ago and had MRI ordered. And the result of the study shows that there is disc degeneration. The  right leg is painful. She has family out of the country and she requests help to allow them to come into the [place] to help her including her husband. No bowel or bladder difficulty. Speaks arabic and requires a Nurse, learning disability.     Review of Systems   Objective: Vital Signs: BP 135/90 (BP Location: Left Arm, Patient Position: Sitting, Cuff Size: Large)   Pulse 100   Ht 5' (1.524 m)   Wt 226 lb (102.5 kg)   BMI 44.14 kg/m   Physical Exam Constitutional:      Appearance: She is well-developed.  HENT:     Head: Normocephalic and atraumatic.  Eyes:     Pupils: Pupils are equal, round, and reactive to light.  Pulmonary:     Effort: Pulmonary effort is normal.     Breath sounds: Normal breath sounds.  Abdominal:     General: Bowel sounds are normal.     Palpations: Abdomen is soft.  Musculoskeletal:     Cervical back: Normal range of motion and neck supple.     Lumbar back: Positive right straight leg raise test. Negative left straight leg raise test.  Skin:    General: Skin is warm and dry.  Neurological:     Mental Status: She is alert and oriented to person, place, and time.  Psychiatric:        Behavior: Behavior normal.        Thought Content: Thought content normal.        Judgment: Judgment normal.    Back Exam   Tenderness  The patient is experiencing tenderness in the lumbar.  Range of Motion  Extension:  abnormal  Flexion:  abnormal  Lateral bend right:  abnormal  Lateral bend left:  abnormal  Rotation right:  abnormal  Rotation left:  abnormal   Muscle Strength  Right Quadriceps:  5/5  Left Quadriceps:  5/5  Right Hamstrings:  5/5  Left Hamstrings:  5/5   Tests  Straight leg raise right: positive Straight leg raise left: negative  Reflexes  Patellar:  1/4 Achilles:  1/4  Other  Toe walk: normal Heel walk: normal  Comments:  Pain with right leg SLR.     Specialty Comments:  No specialty comments available.  Imaging: No results  found.   PMFS History: Patient Active Problem List   Diagnosis Date Noted  . Prediabetes 04/23/2018  . History of gastroesophageal reflux (GERD) 08/03/2017  . History of vitamin D deficiency 08/03/2017  . History of Helicobacter pylori infection 08/03/2017  . History of urinary tract infection 08/03/2017  . Asymptomatic microscopic hematuria 08/03/2017  . Chronic abdominal pain 08/03/2017  . Left lower quadrant pain 06/05/2016  . Hemorrhoid 04/06/2016  . Neck pain 10/28/2015  . Shoulder pain, bilateral 10/28/2015  . Vertigo 07/29/2015  . Tachycardia 07/08/2015  . Vitamin D deficiency 04/08/2015  . Lumbar back pain with radiculopathy affecting left lower extremity 04/07/2015  . 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 Chronic pain of multiple joints 01/29/2015  . Irregular menses 10/01/2014  . Allergic rhinitis 10/01/2014  . Chalazion of left upper eyelid 10/01/2014  . 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 Irritable bowel syndrome (IBS)   . Prediabetes     Family History  Problem Relation Age of Onset  . Hypertension Father     Past Surgical History:  Procedure Laterality Date  . 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 DILATION AND CURETTAGE OF UTERUS  2006  . HEMORRHOID SURGERY  2015  . HEMORRHOID SURGERY     in [place]   Social History   Occupational History  . Not on file  Tobacco Use  . Smoking status: Never  . Smokeless tobacco: Never  Vaping Use  . Vaping Use: Never used  Substance and Sexual Activity  . Alcohol use: No  . Drug use: No  . Sexual activity: Yes

## 2021-06-09 NOTE — Patient Instructions (Signed)
Avoid bending, stooping and avoid lifting weights greater than 10 lbs. Avoid prolong standing and walking. Avoid frequent bending and stooping  No lifting greater than 10 lbs. May use ice or moist heat for pain. Weight loss is of benefit. Handicap license is approved. Dr. Hewitt Blas secretary/Assistant will call to arrange for epidural steroid injection   Tramadol for pain control. Referral for ESI and if this improves the pain then consider PT.

## 2021-06-13 ENCOUNTER — Telehealth: Payer: Self-pay

## 2021-06-13 NOTE — Telephone Encounter (Signed)
Pt called and would like to go ahead and schedule an apt with Dr. Alvester Morin

## 2021-06-13 NOTE — Telephone Encounter (Signed)
This is a referral 

## 2021-06-16 ENCOUNTER — Ambulatory Visit: Payer: 59 | Admitting: Surgery

## 2021-06-16 ENCOUNTER — Telehealth: Payer: Self-pay | Admitting: Internal Medicine

## 2021-06-16 NOTE — Telephone Encounter (Signed)
Scheduled appt per 9/20 referral. Using interpreter services scheduled appt. Pt is aware.

## 2021-06-17 ENCOUNTER — Other Ambulatory Visit: Payer: Self-pay | Admitting: Internal Medicine

## 2021-06-17 ENCOUNTER — Ambulatory Visit: Payer: 59 | Admitting: Nurse Practitioner

## 2021-06-17 DIAGNOSIS — D72829 Elevated white blood cell count, unspecified: Secondary | ICD-10-CM

## 2021-06-20 ENCOUNTER — Telehealth: Payer: Self-pay | Admitting: Physical Medicine and Rehabilitation

## 2021-06-20 ENCOUNTER — Other Ambulatory Visit: Payer: Self-pay

## 2021-06-20 ENCOUNTER — Inpatient Hospital Stay: Payer: 59

## 2021-06-20 ENCOUNTER — Encounter: Payer: Self-pay | Admitting: Internal Medicine

## 2021-06-20 ENCOUNTER — Inpatient Hospital Stay: Payer: 59 | Attending: Internal Medicine | Admitting: Internal Medicine

## 2021-06-20 DIAGNOSIS — D72829 Elevated white blood cell count, unspecified: Secondary | ICD-10-CM | POA: Insufficient documentation

## 2021-06-20 DIAGNOSIS — D72823 Leukemoid reaction: Secondary | ICD-10-CM | POA: Diagnosis not present

## 2021-06-20 LAB — CMP (CANCER CENTER ONLY)
ALT: 13 U/L (ref 0–44)
AST: 14 U/L — ABNORMAL LOW (ref 15–41)
Albumin: 3.1 g/dL — ABNORMAL LOW (ref 3.5–5.0)
Alkaline Phosphatase: 103 U/L (ref 38–126)
Anion gap: 12 (ref 5–15)
BUN: 9 mg/dL (ref 6–20)
CO2: 23 mmol/L (ref 22–32)
Calcium: 9.7 mg/dL (ref 8.9–10.3)
Chloride: 105 mmol/L (ref 98–111)
Creatinine: 0.69 mg/dL (ref 0.44–1.00)
GFR, Estimated: >60 mL/min (ref >60)
Glucose, Bld: 150 mg/dL — ABNORMAL HIGH (ref 70–99)
Potassium: 4 mmol/L (ref 3.5–5.1)
Sodium: 140 mmol/L (ref 135–145)
Total Bilirubin: <0.2 mg/dL — ABNORMAL LOW (ref 0.3–1.2)
Total Protein: 7.5 g/dL (ref 6.5–8.1)

## 2021-06-20 LAB — CBC WITH DIFFERENTIAL (CANCER CENTER ONLY)
Abs Immature Granulocytes: 0.05 10*3/uL (ref 0.00–0.07)
Basophils Absolute: 0 10*3/uL (ref 0.0–0.1)
Basophils Relative: 0 %
Eosinophils Absolute: 0.2 10*3/uL (ref 0.0–0.5)
Eosinophils Relative: 2 %
HCT: 39.6 % (ref 36.0–46.0)
Hemoglobin: 12.8 g/dL (ref 12.0–15.0)
Immature Granulocytes: 1 %
Lymphocytes Relative: 24 %
Lymphs Abs: 1.9 10*3/uL (ref 0.7–4.0)
MCH: 26.8 pg (ref 26.0–34.0)
MCHC: 32.3 g/dL (ref 30.0–36.0)
MCV: 83 fL (ref 80.0–100.0)
Monocytes Absolute: 0.3 10*3/uL (ref 0.1–1.0)
Monocytes Relative: 4 %
Neutro Abs: 5.5 10*3/uL (ref 1.7–7.7)
Neutrophils Relative %: 69 %
Platelet Count: 345 10*3/uL (ref 150–400)
RBC: 4.77 MIL/uL (ref 3.87–5.11)
RDW: 13.8 % (ref 11.5–15.5)
WBC Count: 8 10*3/uL (ref 4.0–10.5)
nRBC: 0 % (ref 0.0–0.2)

## 2021-06-20 LAB — LACTATE DEHYDROGENASE: LDH: 147 U/L (ref 98–192)

## 2021-06-20 NOTE — Telephone Encounter (Signed)
Pt called stating they missed a call for an appt and would like to be tried again.   [phone]

## 2021-06-20 NOTE — Telephone Encounter (Signed)
Pt called and states they need to schedule injection.   CB [phone]

## 2021-06-20 NOTE — Telephone Encounter (Signed)
Patient called. She would like an appointment with Dr. Newton.  

## 2021-06-20 NOTE — Progress Notes (Signed)
[place] [phone]   [phone]  CONSULT NOTE  REFERRING PHYSICIAN: Geryl Rankins, NP  REASON FOR CONSULTATION:  45 years old female with leukocytosis  HPI Candice [name] is a 45 y.o. female with past medical history significant for irritable bowel syndrome, prediabetes, hemorrhoid surgery as well as subacute right lumbar radiculopathy.  The patient was seen by her primary care physician complaining of low back pain with radiation to the right hip and leg.  She was referred to Dr. Louanne Skye and started on several medication including tramadol, prednisone, gabapentin as well as meloxicam with no significant improvement in her condition.  He is considering her for steroid injection to that area.  She had MRI of the lumbar spines on 06/07/2021 that showed L3-4-minute noncompressive disc bulge unchanged.  There was also L4-5 shallow disc protrusion with slight caudal down turning and L5-S1 shallow disc protrusion slightly more prominent towards the left. The patient was seen by her primary care physician for follow-up visit and repeat CBC on 05/25/2021 showed elevated white blood count of 18.1.  She had normal hemoglobin and hematocrit as well as platelet count.  A follow-up CBC on 06/06/2021 showed persistent elevation of the white blood count of 14.2 with again normal hemoglobin and hematocrit and platelets count. Because of the persistent leukocytosis, the patient was referred to [place] for evaluation and recommendation regarding her condition. When seen today she is feeling fine except for the persistent back pain with radiation to the right hip and leg.  She denied having any chest pain, shortness of breath, cough or hemoptysis.  She has no nausea, vomiting, diarrhea but has intermittent constipation.  She denied having any significant weight loss or night sweats.  She has no headache or visual changes. Family history significant for mother with eye problem and father  and brother with hypertension. The patient is married and has no children.  She is currently unemployed and used to work for [employer].  She quit her job after she developed the back pain.  She has no history of smoking, alcohol or drug abuse. HPI  Past Medical History:  Diagnosis Date   Irritable bowel syndrome (IBS)    Prediabetes     Past Surgical History:  Procedure Laterality Date   CIRCUMCISION  as a young girl    Type III femake circumcision    DILATION AND CURETTAGE OF UTERUS  2006   HEMORRHOID SURGERY  2015   HEMORRHOID SURGERY     in [place]    Family History  Problem Relation Age of Onset   Hypertension Father     Social History Social History   Tobacco Use   Smoking status: Never   Smokeless tobacco: Never  Vaping Use   Vaping Use: Never used  Substance Use Topics   Alcohol use: No   Drug use: No    No Known Allergies  Current Outpatient Medications  Medication Sig Dispense Refill   Blood Glucose Monitoring Suppl (ONETOUCH VERIO REFLECT) w/Device KIT use daily as directed  1 kit 0   gabapentin (NEURONTIN) 600 MG tablet Take 0.5 tablets (300 mg total) by mouth 3 (three) times daily. 135 tablet 1   glucose blood (ONETOUCH VERIO) test strip Use as instructed 100 each 12   ketoconazole (NIZORAL) 2 % cream APPLY 1 APPLICATION TOPICALLY DAILY. 60 g 1   meloxicam (MOBIC) 7.5 MG tablet Take 1 tablet (7.5 mg total) by mouth daily. 30 tablet 0   metFORMIN (  GLUCOPHAGE) 500 MG tablet Take 1 tablet (500 mg total) by mouth daily with breakfast. 90 tablet 1   predniSONE (DELTASONE) 20 MG tablet Take 2 tablets (40 mg total) by mouth daily. 10 tablet 0   No current facility-administered medications for this visit.    Review of Systems  Constitutional: negative Eyes: negative Ears, nose, mouth, throat, and face: negative Respiratory: negative Cardiovascular: negative Gastrointestinal: negative Genitourinary:negative Integument/breast:  negative Hematologic/lymphatic: negative Musculoskeletal:positive for back pain Neurological: negative Behavioral/Psych: negative Endocrine: negative Allergic/Immunologic: negative  Physical Exam  TTS:VXBLT, healthy, no distress, well nourished, and well developed SKIN: skin color, texture, turgor are normal, no rashes or significant lesions HEAD: Normocephalic, No masses, lesions, tenderness or abnormalities EYES: normal, PERRLA, Conjunctiva are pink and non-injected EARS: External ears normal, Canals clear OROPHARYNX:no exudate, no erythema, and lips, buccal mucosa, and tongue normal  NECK: supple, no adenopathy, no JVD LYMPH:  no palpable lymphadenopathy, no hepatosplenomegaly BREAST:not examined LUNGS: clear to auscultation , and palpation HEART: regular rate & rhythm, no murmurs, and no gallops ABDOMEN:abdomen soft, non-tender, obese, normal bowel sounds, and no masses or organomegaly BACK: No CVA tenderness, Range of motion is normal EXTREMITIES:no joint deformities, effusion, or inflammation, no edema  NEURO: alert & oriented x 3 with fluent speech, no focal motor/sensory deficits  PERFORMANCE STATUS: ECOG 1  LABORATORY DATA: Lab Results  Component Value Date   WBC 8.0 06/20/2021   HGB 12.8 06/20/2021   HCT 39.6 06/20/2021   MCV 83.0 06/20/2021   PLT 345 06/20/2021      Chemistry      Component Value Date/Time   NA 140 06/20/2021 0959   NA 133 (L) 05/25/2021 1105   K 4.0 06/20/2021 0959   CL 105 06/20/2021 0959   CO2 23 06/20/2021 0959   BUN 9 06/20/2021 0959   BUN 17 05/25/2021 1105   CREATININE 0.69 06/20/2021 0959   CREATININE 0.59 04/07/2015 1201      Component Value Date/Time   CALCIUM 9.7 06/20/2021 0959   ALKPHOS 103 06/20/2021 0959   AST 14 (L) 06/20/2021 0959   ALT 13 06/20/2021 0959   BILITOT <0.2 (L) 06/20/2021 0959       RADIOGRAPHIC STUDIES: MR Lumbar Spine w/o contrast  Result Date: 06/07/2021 CLINICAL DATA:  Low back pain for  greater than 6 weeks which is progressive. Pain worse on the right than the left with lower extremity radiculopathy. EXAM: MRI LUMBAR SPINE WITHOUT CONTRAST TECHNIQUE: Multiplanar, multisequence MR imaging of the lumbar spine was performed. No intravenous contrast was administered. COMPARISON:  Radiography 06/02/2021.  MRI 02/16/2015. FINDINGS: Segmentation:  5 lumbar type vertebral bodies. Alignment:  Minimal scoliotic curvature convex to the right. Vertebrae:  No fracture or focal lesion. Conus medullaris and cauda equina: Conus extends to the T12-L1 level. Conus and cauda equina appear normal. Paraspinal and other soft tissues: Negative Disc levels: No abnormality at L2-3 or above. L3-4: Mild bulging of the disc. No compressive stenosis. No change since 2016. L4-5: Disc degeneration with loss of disc height. Chronic shallow disc protrusion with slight caudal down turning. Slight indentation of the thecal sac and mild stenosis of the lateral recesses but without definite neural compression. No progressive change since 2016. The disc material has desiccated slightly. L5-S1: Disc degeneration with bulging of the disc and a shallow protrusion towards the left. Mild facet and ligamentous hypertrophy. Stenosis of the subarticular lateral recess on the left that could possibly affect the left S1 nerve. The left subarticular lateral recess stenosis  may be slightly more pronounced than was seen previously. The disc material has desiccated slightly. IMPRESSION: L3-4: Minor, non-compressive disc bulge, unchanged. L4-5: Shallow disc protrusion with slight caudal down turning. Slight indentation of the thecal sac and mild stenosis of both lateral recesses but no definite neural compression. No worsening since 2014-12-14. The disc material has desiccated slightly. L5-S1: Shallow disc protrusion slightly more prominent towards the left. Stenosis of the subarticular lateral recess on the left that could possibly affect the left S1  nerve. The subarticular lateral recess narrowing may be slightly more pronounced than was seen in 2014-12-14. The disc material at this level has also desiccated slightly. Electronically Signed   By: Nelson Chimes M.D.   On: 06/07/2021 08:06    ASSESSMENT: This is a very pleasant 45 years old [place] female presented for evaluation of persistent leukocytosis.   PLAN: I had a lengthy discussion with the patient today about her current condition and causes of the leukocytosis. I order several studies today including repeat CBC which completely normal with no leukocytosis, anemia or thrombocytopenia.  The patient also has normal LDH and comprehensive metabolic panel showed elevated blood sugar consistent with her prediabetic status. I think her leukocytosis was reactive in nature secondary to her treatment with the high-dose prednisone which is currently discontinued. I do not see any need for further investigation or testing on this patient with a normal CBC. I recommended for her to continue her routine follow-up visit and evaluation by her primary care provider.   For the back pain, the patient will follow with Dr. Louanne Skye.  I also advised her to work on weight loss which may help her low back pain. I do not see a need for the patient to continue routine follow-up visit with me at this point but I will be happy to see her in the future if there is any concerning issues. The patient was advised to call if she has any concerning complaints. The patient voices understanding of current disease status and treatment options and is in agreement with the current care plan.  All questions were answered. The patient knows to call the clinic with any problems, questions or concerns. We can certainly see the patient much sooner if necessary.  Thank you so much for allowing me to participate in the care of Candice [name]. I will continue to follow up the patient with you and assist in her care.  The total time spent in  the appointment was 60 minutes.  Disclaimer: This note was dictated with voice recognition software. Similar sounding words can inadvertently be transcribed and may not be corrected upon review.   Candice [name] June 20, 2021, 11:15 AM

## 2021-06-22 ENCOUNTER — Other Ambulatory Visit: Payer: Self-pay

## 2021-06-22 ENCOUNTER — Other Ambulatory Visit: Payer: Self-pay | Admitting: Nurse Practitioner

## 2021-06-22 ENCOUNTER — Other Ambulatory Visit: Payer: 59

## 2021-06-22 DIAGNOSIS — E785 Hyperlipidemia, unspecified: Secondary | ICD-10-CM

## 2021-06-22 DIAGNOSIS — E669 Obesity, unspecified: Secondary | ICD-10-CM

## 2021-06-22 DIAGNOSIS — E1169 Type 2 diabetes mellitus with other specified complication: Secondary | ICD-10-CM

## 2021-06-28 ENCOUNTER — Telehealth: Payer: 59 | Admitting: Nurse Practitioner

## 2021-06-29 ENCOUNTER — Encounter: Payer: Self-pay | Admitting: Nurse Practitioner

## 2021-06-29 ENCOUNTER — Other Ambulatory Visit: Payer: Self-pay

## 2021-06-29 ENCOUNTER — Ambulatory Visit: Payer: 59 | Attending: Nurse Practitioner | Admitting: Nurse Practitioner

## 2021-06-29 VITALS — BP 143/95 | HR 107 | Resp 16 | Wt 222.4 lb

## 2021-06-29 DIAGNOSIS — Z6841 Body Mass Index (BMI) 40.0 and over, adult: Secondary | ICD-10-CM

## 2021-06-29 DIAGNOSIS — M5416 Radiculopathy, lumbar region: Secondary | ICD-10-CM | POA: Diagnosis not present

## 2021-06-29 DIAGNOSIS — E669 Obesity, unspecified: Secondary | ICD-10-CM

## 2021-06-29 DIAGNOSIS — E1169 Type 2 diabetes mellitus with other specified complication: Secondary | ICD-10-CM | POA: Diagnosis not present

## 2021-06-29 DIAGNOSIS — R030 Elevated blood-pressure reading, without diagnosis of hypertension: Secondary | ICD-10-CM | POA: Diagnosis not present

## 2021-06-29 MED ORDER — METFORMIN HCL 500 MG PO TABS
500.0000 mg | ORAL_TABLET | Freq: Every day | ORAL | 1 refills | Status: DC
  Filled 2021-06-29: qty 90, 90d supply, fill #0

## 2021-06-29 MED ORDER — MELOXICAM 7.5 MG PO TABS
7.5000 mg | ORAL_TABLET | Freq: Every day | ORAL | 1 refills | Status: AC
  Filled 2021-06-29: qty 90, 90d supply, fill #0

## 2021-06-29 NOTE — Progress Notes (Signed)
Pt states she is having joint pain

## 2021-06-29 NOTE — Progress Notes (Signed)
Assessment & Plan:  Candice [name] was seen today for medication refill.  Diagnoses and all orders for this visit:  Elevated blood pressure reading Remember to bring in your blood pressure log with you for your follow up appointment.  DASH/Mediterranean Diets are healthier choices for HTN.   NEEDS TO WORK ON WEIGHT LOSS  Diabetes mellitus type 2 in obese (HCC) -     metFORMIN (GLUCOPHAGE) 500 MG tablet; Take 1 tablet (500 mg total) by mouth daily with breakfast.  Lumbar back pain with radiculopathy affecting left lower extremity Improved with meloxicam daily -     meloxicam (MOBIC) 7.5 MG tablet; Take 1 tablet (7.5 mg total) by mouth daily. Work on losing weight to help reduce back pain. May alternate with heat and ice application for pain relief. May also alternate with acetaminophen as prescribed for back pain. Other alternatives include massage, acupuncture and water aerobics.  You must stay active and avoid a sedentary lifestyle.     Patient has been counseled on age-appropriate routine health concerns for screening and prevention. These are reviewed and up-to-date. Referrals have been placed accordingly. Immunizations are up-to-date or declined.    Subjective:   Chief Complaint  Patient presents with   Medication Refill   HPI Candice [name] 45 y.o. female presents to office today for medication refill of meloxicam. She has a past medical history of Irritable bowel syndrome (IBS) and Prediabetes.   VRI was used to communicate directly with patient for the entire encounter including providing detailed patient instructions.    She is requesting a letter today for medicaid. States food stamps were stopped and she needs letter stating what her medical diagnoses are in order to recertify.   She is also requesting a letter for her employer from whom she was recently fired for being a no call no show since August 30th. I have instructed her that although she was evaluated in the ED, by  myself and the orthopedic specialist in September this would not override her not calling in to her employer regarding her back pain and inability to work at that time.    Blood pressure is elevated. She believes it is due to "all these medications I am taking". She refused starting antihypertensive today. States if blood pressure elevated at next visit she will start medication to help lower her blood pressure at that time.   Denies chest pain, shortness of breath, palpitations, lightheadedness, dizziness, headaches or BLE edema.  . BP Readings from Last 3 Encounters:  06/29/21 (!) 143/95  06/20/21 (!) 154/98  06/09/21 135/90    DM 2 Well controlled with metformin 500 mg daily. She denies hypoglycemia or hyperglycemia.  Lab Results  Component Value Date   HGBA1C 6.6 (A) 03/16/2021    Review of Systems  Constitutional:  Negative for fever, malaise/fatigue and weight loss.  HENT: Negative.  Negative for nosebleeds.   Eyes: Negative.  Negative for blurred vision, double vision and photophobia.  Respiratory: Negative.  Negative for cough and shortness of breath.   Cardiovascular: Negative.  Negative for chest pain, palpitations and leg swelling.  Gastrointestinal: Negative.  Negative for heartburn, nausea and vomiting.  Musculoskeletal:  Positive for back pain and myalgias.  Neurological: Negative.  Negative for dizziness, focal weakness, seizures and headaches.  Psychiatric/Behavioral: Negative.  Negative for suicidal ideas.    Past Medical History:  Diagnosis Date   Irritable bowel syndrome (IBS)    Prediabetes     Past Surgical History:  Procedure Laterality Date  CIRCUMCISION  as a young girl    Type III femake circumcision    DILATION AND CURETTAGE OF UTERUS  2006   HEMORRHOID SURGERY  2015   HEMORRHOID SURGERY     in [place]    Family History  Problem Relation Age of Onset   Hypertension Father     Social History Reviewed with no changes to be made today.    Outpatient Medications Prior to Visit  Medication Sig Dispense Refill   Blood Glucose Monitoring Suppl (ONETOUCH VERIO REFLECT) w/Device KIT use daily as directed  1 kit 0   estradiol (ESTRACE) 2 MG tablet Take 2 mg by mouth 2 (two) times daily.     gabapentin (NEURONTIN) 600 MG tablet Take 0.5 tablets (300 mg total) by mouth 3 (three) times daily. 135 tablet 1   glucose blood (ONETOUCH VERIO) test strip Use as instructed 100 each 12   ketoconazole (NIZORAL) 2 % cream APPLY 1 APPLICATION TOPICALLY DAILY. 60 g 1   predniSONE (DELTASONE) 20 MG tablet Take 2 tablets (40 mg total) by mouth daily. 10 tablet 0   meloxicam (MOBIC) 7.5 MG tablet Take 1 tablet (7.5 mg total) by mouth daily. 30 tablet 0   metFORMIN (GLUCOPHAGE) 500 MG tablet Take 1 tablet (500 mg total) by mouth daily with breakfast. 90 tablet 1   No facility-administered medications prior to visit.    No Known Allergies     Objective:    BP (!) 143/95   Pulse (!) 107   Resp 16   Wt 222 lb 6.4 oz (100.9 kg)   SpO2 99%   BMI 43.43 kg/m  Wt Readings from Last 3 Encounters:  06/29/21 222 lb 6.4 oz (100.9 kg)  06/20/21 225 lb 1.6 oz (102.1 kg)  06/09/21 226 lb (102.5 kg)    Physical Exam Vitals and nursing note reviewed.  Constitutional:      Appearance: She is well-developed. She is obese.  HENT:     Head: Normocephalic and atraumatic.  Cardiovascular:     Rate and Rhythm: Regular rhythm. Tachycardia present.     Heart sounds: Normal heart sounds. No murmur heard.   No friction rub. No gallop.  Pulmonary:     Effort: Pulmonary effort is normal. No tachypnea or respiratory distress.     Breath sounds: Normal breath sounds. No decreased breath sounds, wheezing, rhonchi or rales.  Chest:     Chest wall: No tenderness.  Abdominal:     General: Bowel sounds are normal.     Palpations: Abdomen is soft.  Musculoskeletal:        General: Normal range of motion.     Cervical back: Normal range of motion.  Skin:     General: Skin is warm and dry.  Neurological:     Mental Status: She is alert and oriented to person, place, and time.     Coordination: Coordination normal.  Psychiatric:        Behavior: Behavior normal. Behavior is cooperative.        Thought Content: Thought content normal.        Judgment: Judgment normal.         Patient has been counseled extensively about nutrition and exercise as well as the importance of adherence with medications and regular follow-up. The patient was given clear instructions to go to ER or return to medical center if symptoms don't improve, worsen or new problems develop. The patient verbalized understanding.   Follow-up: Return in about 3 months (  around 09/29/2021).   Gildardo Pounds, FNP-BC [facility], [place]   06/29/2021, 2:45 PM

## 2021-07-06 ENCOUNTER — Ambulatory Visit (INDEPENDENT_AMBULATORY_CARE_PROVIDER_SITE_OTHER): Payer: 59 | Admitting: Physical Medicine and Rehabilitation

## 2021-07-06 ENCOUNTER — Ambulatory Visit: Payer: Self-pay

## 2021-07-06 ENCOUNTER — Encounter: Payer: Self-pay | Admitting: Physical Medicine and Rehabilitation

## 2021-07-06 ENCOUNTER — Other Ambulatory Visit: Payer: Self-pay

## 2021-07-06 VITALS — BP 139/92 | HR 93

## 2021-07-06 DIAGNOSIS — M5416 Radiculopathy, lumbar region: Secondary | ICD-10-CM

## 2021-07-06 MED ORDER — BETAMETHASONE SOD PHOS & ACET 6 (3-3) MG/ML IJ SUSP
12.0000 mg | Freq: Once | INTRAMUSCULAR | Status: AC
Start: 2021-07-06 — End: 2021-07-06
  Administered 2021-07-06: 12 mg

## 2021-07-06 NOTE — Progress Notes (Signed)
Pt state lower back pain that travels down her right leg. Pt state walking, standing and laying down makes the pain worse. Pt state she takes pain meds and uses heating to help ease her pain.  Numeric Pain Rating Scale and Functional Assessment Average Pain 8   In the last MONTH (on 0-10 scale) has pain interfered with the following?  1. General activity like being  able to carry out your everyday physical activities such as walking, climbing stairs, carrying groceries, or moving a chair?  Rating(10)   +Driver, -BT, -Dye Allergies.

## 2021-07-06 NOTE — Patient Instructions (Signed)

## 2021-07-07 ENCOUNTER — Ambulatory Visit: Payer: 59 | Admitting: Specialist

## 2021-07-07 NOTE — Progress Notes (Signed)
Candice [name] - 45 y.o. female MRN [id number]  Date of birth: 12-16-1975  Office Visit Note: Visit Date: 07/06/2021 PCP: Claiborne Rigg, NP Referred by: Claiborne Rigg, NP  Subjective: Chief Complaint  Patient presents with   Lower Back - Pain   Right Leg - Pain   HPI:  Candice [name] is a 45 y.o. female who comes in today at the request of Dr. Vira Browns for planned Right L4-5 Lumbar Interlaminar epidural steroid injection with fluoroscopic guidance.  The patient has failed conservative care including home exercise, medications, time and activity modification.  This injection will be diagnostic and hopefully therapeutic.  Please see requesting physician notes for further details and justification. MRI reviewed with images and spine model.  MRI reviewed in the note below.  Interpreter present   ROS Otherwise per HPI.  Assessment & Plan: Visit Diagnoses:    ICD-10-CM   1. Lumbar radiculopathy  M54.16 XR C-ARM NO REPORT    Epidural Steroid injection    betamethasone acetate-betamethasone sodium phosphate (CELESTONE) injection 12 mg      Plan: No additional findings.   Meds & Orders:  Meds ordered this encounter  Medications   betamethasone acetate-betamethasone sodium phosphate (CELESTONE) injection 12 mg    Orders Placed This Encounter  Procedures   XR C-ARM NO REPORT   Epidural Steroid injection    Follow-up: Return for visit to requesting physician as needed.   Procedures: No procedures performed  Lumbar Epidural Steroid Injection - Interlaminar Approach with Fluoroscopic Guidance  Patient: Candice [name]      Date of Birth: 1976-05-02 MRN: [id number] PCP: Claiborne Rigg, NP      Visit Date: 07/06/2021   Universal Protocol:     Consent Given By: the patient  Position: PRONE  Additional Comments: Vital signs were monitored before and after the procedure. Patient was prepped and draped in the usual sterile fashion. The correct patient, procedure, and  site was verified.   Injection Procedure Details:   Procedure diagnoses: Lumbar radiculopathy [M54.16]   Meds Administered:  Meds ordered this encounter  Medications   betamethasone acetate-betamethasone sodium phosphate (CELESTONE) injection 12 mg     Laterality: Right  Location/Site:  L4-5  Needle: 3.5 in., 20 ga. Tuohy  Needle Placement: Paramedian epidural  Findings:   -Comments: Excellent flow of contrast into the epidural space.  Procedure Details: Using a paramedian approach from the side mentioned above, the region overlying the inferior lamina was localized under fluoroscopic visualization and the soft tissues overlying this structure were infiltrated with 4 ml. of 1% Lidocaine without Epinephrine. The Tuohy needle was inserted into the epidural space using a paramedian approach.   The epidural space was localized using loss of resistance along with counter oblique bi-planar fluoroscopic views.  After negative aspirate for air, blood, and CSF, a 2 ml. volume of Isovue-250 was injected into the epidural space and the flow of contrast was observed. Radiographs were obtained for documentation purposes.    The injectate was administered into the level noted above.   Additional Comments:  No complications occurred Dressing: 2 x 2 sterile gauze and Band-Aid    Post-procedure details: Patient was observed during the procedure. Post-procedure instructions were reviewed.  Patient left the clinic in stable condition.   Clinical History: MRI LUMBAR SPINE WITHOUT CONTRAST   TECHNIQUE: Multiplanar, multisequence MR imaging of the lumbar spine was performed. No intravenous contrast was administered.   COMPARISON:  Radiography 06/02/2021.  MRI 02/16/2015.   FINDINGS:  Segmentation:  5 lumbar type vertebral bodies.   Alignment:  Minimal scoliotic curvature convex to the right.   Vertebrae:  No fracture or focal lesion.   Conus medullaris and cauda equina: Conus  extends to the T12-L1 level. Conus and cauda equina appear normal.   Paraspinal and other soft tissues: Negative   Disc levels:   No abnormality at L2-3 or above.   L3-4: Mild bulging of the disc. No compressive stenosis. No change since 2015/01/14.   L4-5: Disc degeneration with loss of disc height. Chronic shallow disc protrusion with slight caudal down turning. Slight indentation of the thecal sac and mild stenosis of the lateral recesses but without definite neural compression. No progressive change since 01/14/2015. The disc material has desiccated slightly.   L5-S1: Disc degeneration with bulging of the disc and a shallow protrusion towards the left. Mild facet and ligamentous hypertrophy. Stenosis of the subarticular lateral recess on the left that could possibly affect the left S1 nerve. The left subarticular lateral recess stenosis may be slightly more pronounced than was seen previously. The disc material has desiccated slightly.   IMPRESSION: L3-4: Minor, non-compressive disc bulge, unchanged.   L4-5: Shallow disc protrusion with slight caudal down turning. Slight indentation of the thecal sac and mild stenosis of both lateral recesses but no definite neural compression. No worsening since 01/14/2015. The disc material has desiccated slightly.   L5-S1: Shallow disc protrusion slightly more prominent towards the left. Stenosis of the subarticular lateral recess on the left that could possibly affect the left S1 nerve. The subarticular lateral recess narrowing may be slightly more pronounced than was seen in 2015/01/14. The disc material at this level has also desiccated slightly.     Electronically Signed   By: Paulina Fusi M.D.   On: 06/07/2021 08:06     Objective:  VS:  HT:    WT:   BMI:     BP:(!) 139/92  HR:93bpm  TEMP: ( )  RESP:  Physical Exam Vitals and nursing note reviewed.  Constitutional:      General: She is not in acute distress.    Appearance: Normal  appearance. She is obese. She is not ill-appearing.  HENT:     Head: Normocephalic and atraumatic.     Right Ear: External ear normal.     Left Ear: External ear normal.  Eyes:     Extraocular Movements: Extraocular movements intact.  Cardiovascular:     Rate and Rhythm: Normal rate.     Pulses: Normal pulses.  Pulmonary:     Effort: Pulmonary effort is normal. No respiratory distress.  Abdominal:     General: There is no distension.     Palpations: Abdomen is soft.  Musculoskeletal:        General: Tenderness present.     Cervical back: Neck supple.     Right lower leg: No edema.     Left lower leg: No edema.     Comments: Patient has good distal strength with no pain over the greater trochanters.  No clonus or focal weakness.  Skin:    Findings: No erythema, lesion or rash.  Neurological:     General: No focal deficit present.     Mental Status: She is alert and oriented to person, place, and time.     Sensory: No sensory deficit.     Motor: No weakness or abnormal muscle tone.     Coordination: Coordination normal.  Psychiatric:  Mood and Affect: Mood normal.        Behavior: Behavior normal.     Imaging: XR C-ARM NO REPORT  Result Date: 07/06/2021 Please see Notes tab for imaging impression.

## 2021-07-07 NOTE — Procedures (Signed)
Lumbar Epidural Steroid Injection - Interlaminar Approach with Fluoroscopic Guidance  Patient: Candice [name]      Date of Birth: Aug 01, 1976 MRN: [id number] PCP: Claiborne Rigg, NP      Visit Date: 07/06/2021   Universal Protocol:     Consent Given By: the patient  Position: PRONE  Additional Comments: Vital signs were monitored before and after the procedure. Patient was prepped and draped in the usual sterile fashion. The correct patient, procedure, and site was verified.   Injection Procedure Details:   Procedure diagnoses: Lumbar radiculopathy [M54.16]   Meds Administered:  Meds ordered this encounter  Medications   betamethasone acetate-betamethasone sodium phosphate (CELESTONE) injection 12 mg     Laterality: Right  Location/Site:  L4-5  Needle: 3.5 in., 20 ga. Tuohy  Needle Placement: Paramedian epidural  Findings:   -Comments: Excellent flow of contrast into the epidural space.  Procedure Details: Using a paramedian approach from the side mentioned above, the region overlying the inferior lamina was localized under fluoroscopic visualization and the soft tissues overlying this structure were infiltrated with 4 ml. of 1% Lidocaine without Epinephrine. The Tuohy needle was inserted into the epidural space using a paramedian approach.   The epidural space was localized using loss of resistance along with counter oblique bi-planar fluoroscopic views.  After negative aspirate for air, blood, and CSF, a 2 ml. volume of Isovue-250 was injected into the epidural space and the flow of contrast was observed. Radiographs were obtained for documentation purposes.    The injectate was administered into the level noted above.   Additional Comments:  No complications occurred Dressing: 2 x 2 sterile gauze and Band-Aid    Post-procedure details: Patient was observed during the procedure. Post-procedure instructions were reviewed.  Patient left the clinic in stable  condition.

## 2021-07-22 ENCOUNTER — Ambulatory Visit (INDEPENDENT_AMBULATORY_CARE_PROVIDER_SITE_OTHER): Payer: 59 | Admitting: Specialist

## 2021-07-22 ENCOUNTER — Encounter: Payer: Self-pay | Admitting: Specialist

## 2021-07-22 ENCOUNTER — Other Ambulatory Visit: Payer: Self-pay

## 2021-07-22 VITALS — BP 136/84 | HR 118 | Ht 60.0 in | Wt 222.4 lb

## 2021-07-22 DIAGNOSIS — M4726 Other spondylosis with radiculopathy, lumbar region: Secondary | ICD-10-CM | POA: Diagnosis not present

## 2021-07-22 DIAGNOSIS — M5116 Intervertebral disc disorders with radiculopathy, lumbar region: Secondary | ICD-10-CM | POA: Diagnosis not present

## 2021-07-22 DIAGNOSIS — M5416 Radiculopathy, lumbar region: Secondary | ICD-10-CM

## 2021-07-22 DIAGNOSIS — M5441 Lumbago with sciatica, right side: Secondary | ICD-10-CM

## 2021-07-22 DIAGNOSIS — R29898 Other symptoms and signs involving the musculoskeletal system: Secondary | ICD-10-CM

## 2021-07-22 DIAGNOSIS — G8929 Other chronic pain: Secondary | ICD-10-CM

## 2021-07-22 NOTE — Progress Notes (Signed)
Office Visit Note   Patient: Candice [name]           Date of Birth: 1976/02/23           MRN: [id number] Visit Date: 07/22/2021              Requested by: Gildardo Pounds, NP [place],  [place] [id number] PCP: Gildardo Pounds, NP   Assessment & Plan: Visit Diagnoses: No diagnosis found.  Plan: Avoid bending, stooping and avoid lifting weights greater than 10 lbs. Avoid prolong standing and walking. Avoid frequent bending and stooping  No lifting greater than 10 lbs. May use ice or moist heat for pain. Weight loss is of benefit. Handicap license is approved. Call if the pain recurrs and you may wish to consider a further injection we would then have Dr. Romona Curls secretary/Assistant will call to arrange for epidural steroid injection. Recommend Physical Therapy for lumbar DDD and spondylosis.    Your back condition is not a contraindication to pregnancy or having a baby, but expect back pain may be worsened by extra weight associated with carrying the baby Meloxicam is discontinued and may take tylenol arthritis strength 650 mg up to Three or Four times per day.   Follow-Up Instructions: No follow-ups on file.   Orders:  No orders of the defined types were placed in this encounter.  No orders of the defined types were placed in this encounter.     Procedures: No procedures performed   Clinical Data: No additional findings.   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 Prior to [facility] the pain was right leg and that is better now she has pain into the left leg posterior calf. She is happy with the relief of the pain into the back and right leg. Was seen by Dr. Sharol Given and evaluated for foot pain, she reports the shot in the lumbar spine seems to have helped to relieved the pain into foot. She  thinks the cortisone injection in the spine decreased the pain into the foot. No bowel or bladder difficulty.    Review of Systems  Constitutional: Negative.   HENT: Negative.    Eyes: Negative.   Respiratory: Negative.    Cardiovascular: Negative.   Gastrointestinal: Negative.   Endocrine: Negative.   Genitourinary: Negative.   Musculoskeletal: Negative.   Skin: Negative.   Allergic/Immunologic: Negative.   Neurological: Negative.   Hematological: Negative.   Psychiatric/Behavioral: Negative.      Objective: Vital Signs: BP 136/84   Pulse (!) 118   Ht 5' (1.524 m)   Wt 222 lb 6.4 oz (100.9 kg)   BMI 43.43 kg/m   Physical Exam Constitutional:      Appearance: She is well-developed.  HENT:     Head: Normocephalic and atraumatic.  Eyes:     Pupils: Pupils are equal, round, and reactive to light.  Pulmonary:     Effort: Pulmonary effort is normal.     Breath sounds: Normal breath sounds.  Abdominal:     General: Bowel sounds are normal.     Palpations: Abdomen is soft.  Musculoskeletal:     Cervical back: Normal range of motion and neck supple.     Lumbar back: Negative right straight leg raise test and negative left straight leg raise test.  Skin:  General: Skin is warm and dry.  Neurological:     Mental Status: She is alert and oriented to person, place, and time.  Psychiatric:        Behavior: Behavior normal.        Thought Content: Thought content normal.        Judgment: Judgment normal.    Back Exam   Range of Motion  Extension:  abnormal  Flexion:  abnormal  Lateral bend right:  abnormal  Lateral bend left:  abnormal  Rotation right:  abnormal  Rotation left:  abnormal   Muscle Strength  Right Quadriceps:  5/5  Left Quadriceps:  5/5  Right Hamstrings:  5/5  Left Hamstrings:  5/5   Tests  Straight leg raise right: negative Straight leg raise left: negative  Other  Toe walk: normal Heel walk: normal Sensation: normal Gait: normal       Specialty Comments:  No specialty comments available.  Imaging: No results found.   PMFS History: Patient Active Problem List   Diagnosis Date Noted   Leukocytosis 06/20/2021   Prediabetes 04/23/2018   History of gastroesophageal reflux (GERD) 08/03/2017   History of vitamin D deficiency 08/03/2017   History of Helicobacter pylori infection 08/03/2017   History of urinary tract infection 08/03/2017   Asymptomatic microscopic hematuria 08/03/2017   Chronic abdominal pain 08/03/2017   Left lower quadrant pain 06/05/2016   Hemorrhoid 04/06/2016   Neck pain 10/28/2015   Shoulder pain, bilateral 10/28/2015   Vertigo 07/29/2015   Tachycardia 07/08/2015   Vitamin D deficiency 04/08/2015   Lumbar back pain with radiculopathy affecting left lower extremity 04/07/2015   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   Chronic pain of multiple joints 01/29/2015   Irregular menses 10/01/2014   Allergic rhinitis 10/01/2014   Chalazion of left upper eyelid 10/01/2014   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   Past Medical History:  Diagnosis Date   Irritable bowel syndrome (IBS)    Prediabetes     Family History  Problem Relation Age of Onset   Hypertension Father     Past Surgical History:  Procedure Laterality Date   CIRCUMCISION  as a young girl    Type III femake circumcision    DILATION AND CURETTAGE OF UTERUS  2006   HEMORRHOID SURGERY  2015   HEMORRHOID SURGERY     in [place]   Social History   Occupational History   Not on file  Tobacco Use   Smoking status: Never   Smokeless tobacco: Never  Vaping Use   Vaping Use: Never used  Substance and Sexual Activity   Alcohol use: No   Drug use: No   Sexual activity: Yes

## 2021-07-22 NOTE — Patient Instructions (Addendum)
Avoid bending, stooping and avoid lifting weights greater than 10 lbs. Avoid prolong standing and walking. Avoid frequent bending and stooping  No lifting greater than 10 lbs. May use ice or moist heat for pain. Weight loss is of benefit. Handicap license is approved. Call if the pain recurrs and you may wish to consider a further injection we would then have Dr.  Blas secretary/Assistant will call to arrange for epidural steroid injection. Recommend Physical Therapy for lumbar DDD and spondylosis.    Your back condition is not a contraindication to pregnancy or having a baby, but expect back pain may be worsened by extra weight associated with carrying the baby Meloxicam is discontinued and may take tylenol arthritis strength 650 mg up to Three or Four times per day.

## 2021-08-02 ENCOUNTER — Ambulatory Visit (INDEPENDENT_AMBULATORY_CARE_PROVIDER_SITE_OTHER): Payer: 59 | Admitting: Physical Therapy

## 2021-08-02 ENCOUNTER — Other Ambulatory Visit: Payer: Self-pay

## 2021-08-02 ENCOUNTER — Encounter: Payer: Self-pay | Admitting: Physical Therapy

## 2021-08-02 DIAGNOSIS — M6281 Muscle weakness (generalized): Secondary | ICD-10-CM

## 2021-08-02 DIAGNOSIS — M5442 Lumbago with sciatica, left side: Secondary | ICD-10-CM

## 2021-08-02 DIAGNOSIS — R262 Difficulty in walking, not elsewhere classified: Secondary | ICD-10-CM | POA: Diagnosis not present

## 2021-08-02 DIAGNOSIS — M5441 Lumbago with sciatica, right side: Secondary | ICD-10-CM

## 2021-08-02 DIAGNOSIS — G8929 Other chronic pain: Secondary | ICD-10-CM
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
## 2021-08-02 NOTE — Patient Instructions (Signed)
Access Code: [email or web address] URL: [email or web address] Date: 08/02/2021 Prepared by: Narda Amber  Exercises Supine Posterior Pelvic Tilt - 2 x daily - 7 x weekly - 2 sets - 10 reps - 5 seconds hold Supine March - 2 x daily - 7 x weekly - 2 sets - 10 reps Supine Lower Trunk Rotation - 2 x daily - 7 x weekly - 3 reps - 20 seocnds hold

## 2021-08-12 ENCOUNTER — Encounter (HOSPITAL_COMMUNITY): Payer: Self-pay

## 2021-08-12 ENCOUNTER — Emergency Department (HOSPITAL_COMMUNITY): Payer: 59

## 2021-08-12 ENCOUNTER — Emergency Department (HOSPITAL_COMMUNITY)
Admission: EM | Admit: 2021-08-12 | Discharge: 2021-08-12 | Disposition: A | Discharge status: Home / Self Care | Payer: 59 | Attending: Emergency Medicine | Admitting: Emergency Medicine

## 2021-08-12 ENCOUNTER — Other Ambulatory Visit: Payer: Self-pay

## 2021-08-12 DIAGNOSIS — N938 Other specified abnormal uterine and vaginal bleeding: Secondary | ICD-10-CM | POA: Insufficient documentation

## 2021-08-12 DIAGNOSIS — Z7984 Long term (current) use of oral hypoglycemic drugs: Secondary | ICD-10-CM | POA: Diagnosis not present

## 2021-08-12 DIAGNOSIS — R7303 Prediabetes: Secondary | ICD-10-CM | POA: Diagnosis not present

## 2021-08-12 DIAGNOSIS — D649 Anemia, unspecified: Secondary | ICD-10-CM | POA: Diagnosis not present

## 2021-08-12 DIAGNOSIS — R1030 Lower abdominal pain, unspecified: Secondary | ICD-10-CM | POA: Diagnosis not present

## 2021-08-12 DIAGNOSIS — N939 Abnormal uterine and vaginal bleeding, unspecified: Secondary | ICD-10-CM | POA: Diagnosis present

## 2021-08-12 LAB — I-STAT BETA HCG BLOOD, ED (MC, WL, AP ONLY): I-stat hCG, quantitative: 9.5 m[IU]/mL — ABNORMAL HIGH (ref <5)

## 2021-08-12 LAB — CBC WITH DIFFERENTIAL/PLATELET
Abs Immature Granulocytes: 0.14 10*3/uL — ABNORMAL HIGH (ref 0.00–0.07)
Basophils Absolute: 0.1 10*3/uL (ref 0.0–0.1)
Basophils Relative: 0 %
Eosinophils Absolute: 0.4 10*3/uL (ref 0.0–0.5)
Eosinophils Relative: 4 %
HCT: 33.7 % — ABNORMAL LOW (ref 36.0–46.0)
Hemoglobin: 10.4 g/dL — ABNORMAL LOW (ref 12.0–15.0)
Immature Granulocytes: 1 %
Lymphocytes Relative: 21 %
Lymphs Abs: 2.6 10*3/uL (ref 0.7–4.0)
MCH: 26.5 pg (ref 26.0–34.0)
MCHC: 30.9 g/dL (ref 30.0–36.0)
MCV: 86 fL (ref 80.0–100.0)
Monocytes Absolute: 0.6 10*3/uL (ref 0.1–1.0)
Monocytes Relative: 5 %
Neutro Abs: 8.4 10*3/uL — ABNORMAL HIGH (ref 1.7–7.7)
Neutrophils Relative %: 69 %
Platelets: 463 10*3/uL — ABNORMAL HIGH (ref 150–400)
RBC: 3.92 MIL/uL (ref 3.87–5.11)
RDW: 14.6 % (ref 11.5–15.5)
WBC: 12.1 10*3/uL — ABNORMAL HIGH (ref 4.0–10.5)
nRBC: 0 % (ref 0.0–0.2)

## 2021-08-12 LAB — URINALYSIS, ROUTINE W REFLEX MICROSCOPIC
Bacteria, UA: NONE SEEN
Bilirubin Urine: NEGATIVE
Glucose, UA: NEGATIVE mg/dL
Ketones, ur: NEGATIVE mg/dL
Nitrite: NEGATIVE
Protein, ur: 30 mg/dL — AB
RBC / HPF: >50 RBC/hpf — ABNORMAL HIGH (ref 0–5)
Specific Gravity, Urine: 1.026 (ref 1.005–1.030)
pH: 5 (ref 5.0–8.0)

## 2021-08-12 LAB — BASIC METABOLIC PANEL
Anion gap: 8 (ref 5–15)
BUN: 10 mg/dL (ref 6–20)
CO2: 24 mmol/L (ref 22–32)
Calcium: 8.9 mg/dL (ref 8.9–10.3)
Chloride: 106 mmol/L (ref 98–111)
Creatinine, Ser: 0.78 mg/dL (ref 0.44–1.00)
GFR, Estimated: >60 mL/min (ref >60)
Glucose, Bld: 98 mg/dL (ref 70–99)
Potassium: 3.9 mmol/L (ref 3.5–5.1)
Sodium: 138 mmol/L (ref 135–145)

## 2021-08-12 LAB — WET PREP, GENITAL
Clue Cells Wet Prep HPF POC: NONE SEEN
Sperm: NONE SEEN
Trich, Wet Prep: NONE SEEN
WBC, Wet Prep HPF POC: <10 (ref <10)
Yeast Wet Prep HPF POC: NONE SEEN

## 2021-08-12 LAB — ABO/RH: ABO/RH(D): O POS

## 2021-08-12 LAB — TYPE AND SCREEN
ABO/RH(D): O POS
Antibody Screen: NEGATIVE

## 2021-08-12 MED ORDER — IBUPROFEN 600 MG PO TABS: 600.0000 mg | ORAL_TABLET | Freq: Four times a day (QID) | ORAL | 0 refills | Status: DC | PRN

## 2021-08-12 MED ORDER — ACETAMINOPHEN 500 MG PO TABS
1000.0000 mg | ORAL_TABLET | Freq: Once | ORAL | Status: AC
  Administered 2021-08-12: 1000 mg via ORAL
  Filled 2021-08-12: qty 2

## 2021-08-12 MED ORDER — MEGESTROL ACETATE 40 MG PO TABS: 40.0000 mg | ORAL_TABLET | Freq: Every day | ORAL | 0 refills | Status: DC

## 2021-08-12 MED ORDER — KETOROLAC TROMETHAMINE 30 MG/ML IJ SOLN
30.0000 mg | Freq: Once | INTRAMUSCULAR | Status: AC
  Administered 2021-08-12: 30 mg via INTRAVENOUS
  Filled 2021-08-12: qty 1

## 2021-08-12 MED ORDER — SODIUM CHLORIDE 0.9 % IV BOLUS
1000.0000 mL | Freq: Once | INTRAVENOUS | Status: AC
  Administered 2021-08-12: 1000 mL via INTRAVENOUS

## 2021-08-12 NOTE — ED Provider Notes (Signed)
[place] DEPT Provider Note   CSN: [id number] Arrival date & time: 08/12/21  1449     History Chief Complaint  Patient presents with   Vaginal Bleeding   Abdominal Pain    Candice [name] is a 45 y.o. female.  Pt presents to the ED today with vaginal bleeding.  The pt said she has had vaginal bleeding since September.  She speaks Arabic and requests that her sister translate.  Pt said she bleeds every day and has to change her pad every 2 hours.  She has a lot of lower abdominal cramping.  It is unclear if she has seen an obgyn, but she said a doctor at [facility] prescribed her estradiol initially.  She thinks that is what started the bleeding.  She also has 2 bottles of Norethindrone and 1 bottle of Provera.  She said nothing is stopping her bleeding.       Past Medical History:  Diagnosis Date   Irritable bowel syndrome (IBS)    Prediabetes     Patient Active Problem List   Diagnosis Date Noted   Leukocytosis 06/20/2021   Prediabetes 04/23/2018   History of gastroesophageal reflux (GERD) 08/03/2017   History of vitamin D deficiency 01/74/9449   History of Helicobacter pylori infection 08/03/2017   History of urinary tract infection 08/03/2017   Asymptomatic microscopic hematuria 08/03/2017   Chronic abdominal pain 08/03/2017   Left lower quadrant pain 06/05/2016   Hemorrhoid 04/06/2016   Neck pain 10/28/2015   Shoulder pain, bilateral 10/28/2015   Vertigo 07/29/2015   Tachycardia 07/08/2015   Vitamin D deficiency 04/08/2015   Lumbar back pain with radiculopathy affecting left lower extremity 04/07/2015   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   Chronic pain of multiple joints 01/29/2015   Irregular menses 10/01/2014   Allergic rhinitis 10/01/2014   Chalazion of left upper eyelid 10/01/2014   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    Past Surgical History:  Procedure Laterality Date   CIRCUMCISION  as a young girl     Type III femake circumcision    DILATION AND CURETTAGE OF UTERUS  2006   HEMORRHOID SURGERY  2015   HEMORRHOID SURGERY     in [place]     OB History     Gravida  0   Para  0   Term  0   Preterm  0   AB  0   Living  0      SAB  0   IAB  0   Ectopic  0   Multiple  0   Live Births              Family History  Problem Relation Age of Onset   Hypertension Father     Social History   Tobacco Use   Smoking status: Never   Smokeless tobacco: Never  Vaping Use   Vaping Use: Never used  Substance Use Topics   Alcohol use: No   Drug use: No    Home Medications Prior to Admission medications   Medication Sig Start Date End Date Taking? Authorizing Provider  ibuprofen (ADVIL) 600 MG tablet Take 1 tablet (600 mg total) by mouth every 6 (six) hours as needed. 08/12/21  Yes Isla Pence, MD  megestrol (MEGACE) 40 MG tablet Take 1 tablet (40 mg total) by mouth daily. 08/12/21  Yes Isla Pence, MD  Blood Glucose Monitoring Suppl [facility] VERIO REFLECT) w/Device KIT use daily as directed  03/16/21  Charlott Rakes, MD  estradiol (ESTRACE) 2 MG tablet Take 2 mg by mouth 2 (two) times daily. 06/11/21   [provider]  gabapentin (NEURONTIN) 600 MG tablet Take 0.5 tablets (300 mg total) by mouth 3 (three) times daily. 05/25/21 08/23/21  Gildardo Pounds, NP  glucose blood (ONETOUCH VERIO) test strip Use as instructed 03/16/21   Charlott Rakes, MD  ketoconazole (NIZORAL) 2 % cream APPLY 1 APPLICATION TOPICALLY DAILY. 08/18/20 08/18/21  Gildardo Pounds, NP  meloxicam (MOBIC) 7.5 MG tablet Take 1 tablet (7.5 mg total) by mouth daily. 06/29/21 09/27/21  Gildardo Pounds, NP  metFORMIN (GLUCOPHAGE) 500 MG tablet Take 1 tablet (500 mg total) by mouth daily with breakfast. 06/29/21   Gildardo Pounds, NP  predniSONE (DELTASONE) 20 MG tablet Take 2 tablets (40 mg total) by mouth daily. 05/30/21   Hans Eden, NP    Allergies    Patient has no known  allergies.  Review of Systems   Review of Systems  Gastrointestinal:  Positive for abdominal pain.  Genitourinary:  Positive for vaginal bleeding.  All other systems reviewed and are negative.  Physical Exam Updated Vital Signs BP (!) 158/103   Pulse 92   Temp 98.1 F (36.7 C) (Oral)   Resp 20   Ht 5' (1.524 m)   Wt 100.9 kg   LMP 06/06/2021   SpO2 99%   BMI 43.43 kg/m   Physical Exam Vitals and nursing note reviewed. Exam conducted with a chaperone present.  Constitutional:      Appearance: She is well-developed. She is obese.  HENT:     Head: Normocephalic and atraumatic.     Mouth/Throat:     Mouth: Mucous membranes are moist.     Pharynx: Oropharynx is clear.  Eyes:     Extraocular Movements: Extraocular movements intact.     Pupils: Pupils are equal, round, and reactive to light.  Cardiovascular:     Rate and Rhythm: Normal rate and regular rhythm.     Heart sounds: Normal heart sounds.  Pulmonary:     Effort: Pulmonary effort is normal.     Breath sounds: Normal breath sounds.  Abdominal:     General: Abdomen is flat. Bowel sounds are normal.     Palpations: Abdomen is soft.     Tenderness: There is abdominal tenderness in the suprapubic area.  Genitourinary:    Vagina: Bleeding present.     Comments: Pt appears to have had a female circumcision.  She also has a lot of scar tissue with the introitus slightly larger than my pinky finger.  I am unable to insert the speculum.  Skin:    General: Skin is warm.     Capillary Refill: Capillary refill takes less than 2 seconds.  Neurological:     General: No focal deficit present.     Mental Status: She is alert and oriented to person, place, and time.  Psychiatric:        Mood and Affect: Mood normal.        Behavior: Behavior normal.    ED Results / Procedures / Treatments   Labs (all labs ordered are listed, but only abnormal results are displayed) Labs Reviewed  CBC WITH DIFFERENTIAL/PLATELET -  Abnormal; Notable for the following components:      Result Value   WBC 12.1 (*)    Hemoglobin 10.4 (*)    HCT 33.7 (*)    Platelets 463 (*)    Neutro Abs 8.4 (*)  Abs Immature Granulocytes 0.14 (*)    All other components within normal limits  URINALYSIS, ROUTINE W REFLEX MICROSCOPIC - Abnormal; Notable for the following components:   APPearance HAZY (*)    Hgb urine dipstick LARGE (*)    Protein, ur 30 (*)    Leukocytes,Ua TRACE (*)    RBC / HPF >50 (*)    All other components within normal limits  I-STAT BETA HCG BLOOD, ED ([facility], [facility], [facility] ONLY) - Abnormal; Notable for the following components:   I-stat hCG, quantitative 9.5 (*)    All other components within normal limits  WET PREP, GENITAL  BASIC METABOLIC PANEL  TYPE AND SCREEN  ABO/RH  GC/CHLAMYDIA PROBE AMP ([facility]) NOT AT [facility]    EKG EKG Interpretation  Date/Time:  Friday August 12 2021 16:51:05 EST Ventricular Rate:  94 PR Interval:  133 QRS Duration: 98 QT Interval:  324 QTC Calculation: 406 R Axis:   45 Text Interpretation: Sinus rhythm Borderline T abnormalities, anterior leads No significant change since last tracing Confirmed by Isla Pence [phone]) on 08/12/2021 5:49:43 PM  Radiology US Pelvis Complete  Result Date: 08/12/2021 CLINICAL DATA:  Intense pelvic pain with heavy vaginal bleeding for 2 months. LMP unknown. EXAM: TRANSABDOMINAL ULTRASOUND OF PELVIS TECHNIQUE: Transabdominal ultrasound examination of the pelvis was performed including evaluation of the uterus, ovaries, adnexal regions, and pelvic cul-de-sac. Transvaginal study could not be performed due to reported narrowed vagina from previous female circumcision. COMPARISON:  Abdominopelvic CT 06/03/2019 FINDINGS: Uterus Measurements: 9.4 x 7.5 x 5.8 cm = volume: 216.3 mL. No fibroids or other mass visualized. Endometrium Thickness: 14 mm.  No focal abnormality visualized. Right ovary Measurements: Not visualized. Left ovary Measurements: Not  visualized. Other findings: Limited examination due to body habitus, bowel and inability to perform a transvaginal study. No pelvic ascites identified. IMPRESSION: 1. The uterus appears unremarkable on transabdominal imaging. Endometrial thickness within physiologic limits. 2. Neither ovary visualized.  No evidence of adnexal mass. 3. Study limited by inability to perform transvaginal imaging. Electronically Signed   By: Richardean Sale M.D.   On: 08/12/2021 18:19    Procedures Procedures   Medications Ordered in ED Medications  sodium chloride 0.9 % bolus 1,000 mL (1,000 mLs Intravenous Bolus 08/12/21 1645)  ketorolac (TORADOL) 30 MG/ML injection 30 mg (30 mg Intravenous Given 08/12/21 1646)  acetaminophen (TYLENOL) tablet 1,000 mg (1,000 mg Oral Given 08/12/21 1742)    ED Course  I have reviewed the triage vital signs and the nursing notes.  Pertinent labs & imaging results that were available during my care of the patient were reviewed by me and considered in my medical decision making (see chart for details).    MDM Rules/Calculators/A&P                           I used the video interpreter when I talked to pt about what is going on so she can be sure to understand.  I told her to stop taking all meds other than her iron pills and just take the megace.  She has an appt with gyn on 11/30.  She is stable for d/c.  Return if worse.  Final Clinical Impression(s) / ED Diagnoses Final diagnoses:  DUB (dysfunctional uterine bleeding)  Anemia, unspecified type    Rx / DC Orders ED Discharge Orders          Ordered    megestrol (MEGACE) 40 MG tablet  Daily        08/12/21 1844    ibuprofen (ADVIL) 600 MG tablet  Every 6 hours PRN        08/12/21 1844             Isla Pence, MD 08/12/21 1847

## 2021-08-12 NOTE — ED Provider Notes (Signed)
Emergency Medicine Provider Triage Evaluation Note  Candice [name] , a 45 y.o. female  was evaluated in triage.  Pt complains of no bleeding.  Vaginal bleeding since end of September.  Changing pad every hour and 1/2-2 hours.  Feels lightheaded.  Now having clots.  Having some lower abdominal pain.  Denies chance of pregnancy. No syncope  Review of Systems  Positive: Vaginal bleeding, lightheadedness Negative:   Physical Exam  BP (!) 149/98 (BP Location: Left Arm)   Pulse (!) 106   Temp 98.1 F (36.7 C) (Oral)   Resp 16   SpO2 97%  Gen:   Awake, no distress   Resp:  Normal effort  MSK:   Moves extremities without difficulty  Other:    Medical Decision Making  Medically screening exam initiated at 3:09 PM.  Appropriate orders placed.  Candice [name] was informed that the remainder of the evaluation will be completed by another provider, this initial triage assessment does not replace that evaluation, and the importance of remaining in the ED until their evaluation is complete.  Vaginal bleeding   Candice [name] A, PA-C 08/12/21 1511    Candice Sleeper, MD 08/12/21 1616

## 2021-08-12 NOTE — ED Triage Notes (Signed)
Patient reports that she has been having heavy vaginal bleeding since 06/06/21 and states vaginal bleeding constant and states she changes her peripads every 2 hours.

## 2021-08-12 NOTE — ED Notes (Signed)
Pt walked to restroom 

## 2021-08-15 LAB — GC/CHLAMYDIA PROBE AMP (~~LOC~~) NOT AT ARMC
Chlamydia: NEGATIVE
Comment: NEGATIVE
Comment: NORMAL
Neisseria Gonorrhea: NEGATIVE

## 2021-08-16 ENCOUNTER — Ambulatory Visit (INDEPENDENT_AMBULATORY_CARE_PROVIDER_SITE_OTHER): Payer: 59 | Admitting: Physical Therapy

## 2021-08-16 ENCOUNTER — Encounter: Payer: Self-pay | Admitting: Physical Therapy

## 2021-08-16 ENCOUNTER — Other Ambulatory Visit: Payer: Self-pay

## 2021-08-16 DIAGNOSIS — M5442 Lumbago with sciatica, left side: Secondary | ICD-10-CM | POA: Diagnosis not present

## 2021-08-16 DIAGNOSIS — G8929 Other chronic pain: Secondary | ICD-10-CM

## 2021-08-16 DIAGNOSIS — R262 Difficulty in walking, not elsewhere classified: Secondary | ICD-10-CM | POA: Diagnosis not present

## 2021-08-16 DIAGNOSIS — M6281 Muscle weakness (generalized): Secondary | ICD-10-CM | POA: Diagnosis not present

## 2021-08-16 DIAGNOSIS — M5441 Lumbago with sciatica, right side: Secondary | ICD-10-CM | POA: Diagnosis not present
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
## 2021-08-16 NOTE — Patient Instructions (Signed)
Access Code: [email or web address] URL: [email or web address] Date: 08/16/2021 Prepared by: Raynelle Fanning  Exercises Supine Posterior Pelvic Tilt - 2 x daily - 7 x weekly - 2 sets - 10 reps - 5 seconds hold Supine March - 2 x daily - 7 x weekly - 2 sets - 10 reps Supine Lower Trunk Rotation - 2 x daily - 7 x weekly - 3 reps - 20 seocnds hold Squat with Chair Touch - 1 x daily - 7 x weekly - 3 sets - 10 reps Standing Lumbar Extension at Wall - Forearms - 4-5 x daily - 7 x weekly - 2 sets - 10 reps Seated Table Piriformis Stretch (Mirrored) - 2 x daily - 7 x weekly - 1 sets - 3 reps - 30-60 sec hold

## 2021-08-18 ENCOUNTER — Ambulatory Visit: Payer: 59 | Admitting: Physician Assistant

## 2021-08-19 ENCOUNTER — Other Ambulatory Visit: Payer: Self-pay

## 2021-08-19 ENCOUNTER — Encounter: Payer: Self-pay | Admitting: Physical Therapy

## 2021-08-19 ENCOUNTER — Ambulatory Visit (INDEPENDENT_AMBULATORY_CARE_PROVIDER_SITE_OTHER): Payer: 59 | Admitting: Physical Therapy

## 2021-08-19 DIAGNOSIS — G8929 Other chronic pain: Secondary | ICD-10-CM

## 2021-08-19 DIAGNOSIS — M5441 Lumbago with sciatica, right side: Secondary | ICD-10-CM

## 2021-08-19 DIAGNOSIS — M6281 Muscle weakness (generalized): Secondary | ICD-10-CM | POA: Diagnosis not present

## 2021-08-19 DIAGNOSIS — M5442 Lumbago with sciatica, left side: Secondary | ICD-10-CM

## 2021-08-19 DIAGNOSIS — R262 Difficulty in walking, not elsewhere classified: Secondary | ICD-10-CM
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
## 2021-08-23 ENCOUNTER — Ambulatory Visit (INDEPENDENT_AMBULATORY_CARE_PROVIDER_SITE_OTHER): Payer: 59 | Admitting: Physical Therapy

## 2021-08-23 ENCOUNTER — Encounter: Payer: Self-pay | Admitting: Physical Therapy

## 2021-08-23 ENCOUNTER — Other Ambulatory Visit: Payer: Self-pay

## 2021-08-23 DIAGNOSIS — R262 Difficulty in walking, not elsewhere classified: Secondary | ICD-10-CM

## 2021-08-23 DIAGNOSIS — G8929 Other chronic pain: Secondary | ICD-10-CM

## 2021-08-23 DIAGNOSIS — M6281 Muscle weakness (generalized): Secondary | ICD-10-CM

## 2021-08-23 DIAGNOSIS — M5441 Lumbago with sciatica, right side: Secondary | ICD-10-CM

## 2021-08-23 DIAGNOSIS — M5442 Lumbago with sciatica, left side: Secondary | ICD-10-CM

## 2021-08-23 NOTE — Patient Instructions (Signed)
Access Code: [email or web address] URL: [email or web address] Date: 08/23/2021 Prepared by: Narda Amber  Exercises Supine Posterior Pelvic Tilt - 2 x daily - 7 x weekly - 2 sets - 10 reps - 5 seconds hold Supine March - 2 x daily - 7 x weekly - 2 sets - 10 reps Supine Lower Trunk Rotation - 2 x daily - 7 x weekly - 3 reps - 20 seocnds hold Squat with Chair Touch - 1 x daily - 7 x weekly - 3 sets - 10 reps Standing Lumbar Extension at Wall - Forearms - 4-5 x daily - 7 x weekly - 2 sets - 10 reps Seated Table Piriformis Stretch (Mirrored) - 2 x daily - 7 x weekly - 1 sets - 3 reps - 30-60 sec hold Mini Squat with Counter Support - 2 x daily - 7 x weekly - 2 sets - 10 reps Supine Figure 4 Piriformis Stretch - 2 x daily - 7 x weekly - 10 reps - 5-10 seconds hold
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
## 2021-08-25 ENCOUNTER — Other Ambulatory Visit: Payer: Self-pay | Admitting: Obstetrics & Gynecology

## 2021-08-25 ENCOUNTER — Encounter: Payer: 59 | Admitting: Physical Therapy

## 2021-08-29 ENCOUNTER — Encounter: Payer: 59 | Admitting: Physical Therapy

## 2021-08-31 ENCOUNTER — Encounter: Payer: 59 | Admitting: Physical Therapy

## 2021-09-02 ENCOUNTER — Ambulatory Visit: Payer: 59 | Admitting: Specialist

## 2021-09-30 ENCOUNTER — Telehealth: Payer: Self-pay

## 2021-09-30 ENCOUNTER — Other Ambulatory Visit: Payer: Self-pay | Admitting: Nurse Practitioner

## 2021-09-30 ENCOUNTER — Encounter: Payer: Self-pay | Admitting: Nurse Practitioner

## 2021-09-30 ENCOUNTER — Other Ambulatory Visit: Payer: Self-pay

## 2021-09-30 ENCOUNTER — Telehealth: Payer: Self-pay | Admitting: Specialist

## 2021-09-30 ENCOUNTER — Ambulatory Visit: Payer: 59 | Attending: Nurse Practitioner | Admitting: Nurse Practitioner

## 2021-09-30 DIAGNOSIS — E1169 Type 2 diabetes mellitus with other specified complication: Secondary | ICD-10-CM

## 2021-09-30 DIAGNOSIS — R7303 Prediabetes: Secondary | ICD-10-CM

## 2021-09-30 DIAGNOSIS — D72829 Elevated white blood cell count, unspecified: Secondary | ICD-10-CM

## 2021-09-30 DIAGNOSIS — M542 Cervicalgia: Secondary | ICD-10-CM

## 2021-09-30 DIAGNOSIS — E785 Hyperlipidemia, unspecified: Secondary | ICD-10-CM | POA: Diagnosis not present

## 2021-09-30 MED ORDER — METFORMIN HCL 500 MG PO TABS
500.0000 mg | ORAL_TABLET | Freq: Every day | ORAL | 1 refills | Status: DC
  Filled 2021-09-30: qty 30, 30d supply, fill #0
  Filled 2021-11-08: qty 30, 30d supply, fill #1

## 2021-09-30 NOTE — Telephone Encounter (Signed)
error 

## 2021-09-30 NOTE — Progress Notes (Signed)
Virtual Visit via Telephone Note Due to national recommendations of social distancing due to [place] 19, telehealth visit is felt to be most appropriate for this patient at this time.  I discussed the limitations, risks, security and privacy concerns of performing an evaluation and management service by telephone and the availability of in person appointments. I also discussed with the patient that there may be a patient responsible charge related to this service. The patient expressed understanding and agreed to proceed.    I connected with Candice [name] on 10/02/21  at  11:10 AM EST  EDT by telephone and verified that I am speaking with the correct person using two identifiers.  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 She has a lumbar condition which is worsened by bending, stooping and lifting weights more than 20lbs. Today she is requesting an MRI for her neck. Notes symptoms of cervical radiculopathy with numbness in arms and hands. Will order xray and she may need to follow back up with orthopedics.   Prediabetes Well controlled with metformin 500 mg daily as prescribed .  Lab Results  Component Value Date   HGBA1C 6.3 (H) 09/30/2021     Past Medical History:  Diagnosis Date   Irritable bowel syndrome (IBS)    Prediabetes     Past Surgical History:  Procedure Laterality Date   CIRCUMCISION  as a young girl    Type III femake circumcision    DILATION AND CURETTAGE OF UTERUS  2006   HEMORRHOID SURGERY  2015   HEMORRHOID SURGERY     in [place]    Family History  Problem Relation Age of Onset   Hypertension Father     Social History   Socioeconomic History   Marital status: Single     Spouse name: Not on file   Number of children: Not on file   Years of education: Not on file   Highest education level: Not on file  Occupational History   Not on file  Tobacco Use   Smoking status: Never   Smokeless tobacco: Never  Vaping Use   Vaping Use: Never used  Substance and Sexual Activity   Alcohol use: No   Drug use: No   Sexual activity: Yes  Other Topics Concern   Not on file  Social History Narrative   Not on file   Social Determinants of Health   Financial Resource Strain: Not on file  Food Insecurity: Not on file  Transportation Needs: Not on file  Physical Activity: Not on file  Stress: Not on file  Social Connections: Not on file     Observations/Objective: Awake, alert and oriented x 3   Review of Systems  Constitutional:  Negative for fever, malaise/fatigue and weight loss.  HENT: Negative.  Negative for nosebleeds.   Eyes: Negative.  Negative for blurred vision, double vision and photophobia.  Respiratory: Negative.  Negative for cough and shortness of breath.   Cardiovascular: Negative.  Negative for chest pain, palpitations and leg swelling.  Gastrointestinal: Negative.  Negative for heartburn, nausea and vomiting.  Musculoskeletal:  Positive for back pain and neck pain. Negative for myalgias.  Neurological:  Positive for sensory change. Negative for dizziness, focal weakness, seizures and headaches.  Psychiatric/Behavioral:  Negative.  Negative for suicidal ideas.    Assessment and Plan: Diagnoses and all orders for this visit:  Neck pain -     DG Cervical Spine Complete; Future  Leukocytosis, unspecified type -     CBC with Differential  Prediabetes -     CMP14+EGFR -     Hemoglobin A1c -     metFORMIN (GLUCOPHAGE) 500 MG tablet; Take 1 tablet (500 mg total) by mouth daily with breakfast.  Dyslipidemia, goal LDL below 70 -     Lipid panel     Follow Up Instructions Return in about 3 months (around 12/29/2021).     I discussed  the assessment and treatment plan with the patient. The patient was provided an opportunity to ask questions and all were answered. The patient agreed with the plan and demonstrated an understanding of the instructions.   The patient was advised to call back or seek an in-person evaluation if the symptoms worsen or if the condition fails to improve as anticipated.  I provided 15 minutes of non-face-to-face time during this encounter including median intraservice time, reviewing previous notes, labs, imaging, medications and explaining diagnosis and management.  Gildardo Pounds, FNP-BC

## 2021-10-01 LAB — CBC WITH DIFFERENTIAL/PLATELET
Basophils Absolute: 0 10*3/uL (ref 0.0–0.2)
Basos: 1 %
EOS (ABSOLUTE): 0.2 10*3/uL (ref 0.0–0.4)
Eos: 3 %
Hematocrit: 36.6 % (ref 34.0–46.6)
Hemoglobin: 11.2 g/dL (ref 11.1–15.9)
Immature Grans (Abs): 0.1 10*3/uL (ref 0.0–0.1)
Immature Granulocytes: 1 %
Lymphocytes Absolute: 2 10*3/uL (ref 0.7–3.1)
Lymphs: 23 %
MCH: 24.6 pg — ABNORMAL LOW (ref 26.6–33.0)
MCHC: 30.6 g/dL — ABNORMAL LOW (ref 31.5–35.7)
MCV: 80 fL (ref 79–97)
Monocytes Absolute: 0.5 10*3/uL (ref 0.1–0.9)
Monocytes: 5 %
Neutrophils Absolute: 6 10*3/uL (ref 1.4–7.0)
Neutrophils: 67 %
Platelets: 394 10*3/uL (ref 150–450)
RBC: 4.55 x10E6/uL (ref 3.77–5.28)
RDW: 13 % (ref 11.7–15.4)
WBC: 8.8 10*3/uL (ref 3.4–10.8)

## 2021-10-01 LAB — CMP14+EGFR
ALT: 13 IU/L (ref 0–32)
AST: 17 IU/L (ref 0–40)
Albumin/Globulin Ratio: 1.4 (ref 1.2–2.2)
Albumin: 4 g/dL (ref 3.8–4.8)
Alkaline Phosphatase: 125 IU/L — ABNORMAL HIGH (ref 44–121)
BUN/Creatinine Ratio: 17 (ref 9–23)
BUN: 12 mg/dL (ref 6–24)
Bilirubin Total: <0.2 mg/dL (ref 0.0–1.2)
CO2: 25 mmol/L (ref 20–29)
Calcium: 9.5 mg/dL (ref 8.7–10.2)
Chloride: 101 mmol/L (ref 96–106)
Creatinine, Ser: 0.7 mg/dL (ref 0.57–1.00)
Globulin, Total: 2.8 g/dL (ref 1.5–4.5)
Glucose: 118 mg/dL — ABNORMAL HIGH (ref 70–99)
Potassium: 4.2 mmol/L (ref 3.5–5.2)
Sodium: 141 mmol/L (ref 134–144)
Total Protein: 6.8 g/dL (ref 6.0–8.5)
eGFR: 110 mL/min/{1.73_m2} (ref >59)

## 2021-10-01 LAB — LIPID PANEL
Chol/HDL Ratio: 3.3 ratio (ref 0.0–4.4)
Cholesterol, Total: 214 mg/dL — ABNORMAL HIGH (ref 100–199)
HDL: 64 mg/dL (ref >39)
LDL Chol Calc (NIH): 130 mg/dL — ABNORMAL HIGH (ref 0–99)
Triglycerides: 114 mg/dL (ref 0–149)
VLDL Cholesterol Cal: 20 mg/dL (ref 5–40)

## 2021-10-01 LAB — HEMOGLOBIN A1C
Est. average glucose Bld gHb Est-mCnc: 134 mg/dL
Hgb A1c MFr Bld: 6.3 % — ABNORMAL HIGH (ref 4.8–5.6)

## 2021-10-02 ENCOUNTER — Encounter: Payer: Self-pay | Admitting: Nurse Practitioner

## 2021-10-06 ENCOUNTER — Other Ambulatory Visit: Payer: Self-pay

## 2021-10-12 ENCOUNTER — Encounter: Payer: Self-pay | Admitting: Surgery

## 2021-10-12 ENCOUNTER — Other Ambulatory Visit: Payer: Self-pay

## 2021-10-12 ENCOUNTER — Ambulatory Visit (INDEPENDENT_AMBULATORY_CARE_PROVIDER_SITE_OTHER): Payer: 59 | Admitting: Surgery

## 2021-10-12 ENCOUNTER — Ambulatory Visit: Payer: Self-pay

## 2021-10-12 VITALS — BP 138/86 | HR 88

## 2021-10-12 DIAGNOSIS — M542 Cervicalgia: Secondary | ICD-10-CM

## 2021-10-12 DIAGNOSIS — M5416 Radiculopathy, lumbar region: Secondary | ICD-10-CM | POA: Diagnosis not present

## 2021-10-12 NOTE — Progress Notes (Signed)
Office Visit Note   Patient: Candice [name]           Date of Birth: 07-Oct-1975           MRN: [id number] Visit Date: 10/12/2021              Requested by: Gildardo Pounds, NP [place],  [place] [id number] PCP: Gildardo Pounds, NP   Assessment & Plan: Visit Diagnoses:  1. Neck pain   2. Lumbar back pain with radiculopathy affecting left lower extremity     Plan: Since patient is currently complaining of left leg pain only I will schedule her for left L5-S1 transforaminal ESI with Dr. Ernestina Patches.  It appears that she had good result with previous L4-5 ESI that relieved right leg symptoms.  In regards to her neck pain I will have physical therapy work on this as well and they can continue treatment for her low back.  Patient had wanted to jump immediately into a cervical MRI but I told her that we need to do some physical therapy and conservative management before going that route.  Follow-up with Dr. Louanne Skye in 5 weeks for recheck to see how she responded to the repeat ESI and he can also see how her neck is doing.  She may need cervical MRI if she has not had any improvement with therapy.  Follow-Up Instructions: Return in about 5 weeks (around 11/16/2021) for WITH DR Douglass Hills RECHECK AFTER LUMBAR ESI AND NECK.   Orders:  Orders Placed This Encounter  Procedures   XR Cervical Spine 2 or 3 views   No orders of the defined types were placed in this encounter.     Procedures: No procedures performed   Clinical Data: No additional findings.   Subjective: Chief Complaint  Patient presents with   Neck - Follow-up   Lower Back - Follow-up    HPI 46 year old female comes in with interpreter today.  Complaining of chronic low back pain and now neck pain.  We have not seen her for any previous neck issues.  States that she is had chronic neck pain for a while.  Pain in the neck that radiates into both shoulders.  Some numbness and tingling in both hands.  I had initially  seen patient in September 2022 and she had no complaints of neck issues at that time.  She had a virtual visit with her primary care provider and patient had requested a MRI at that time.  This was not done.  She continues have ongoing pain in her low back but now having left lower extremity radicular symptoms.  She had previous lumbar ESI with Dr. Ernestina Patches a couple months ago and this relieved her right leg symptoms.  Previous lumbar MRI June 07, 2021.  She was last seen by Dr. Louanne Skye November 2022 and at that time he stated that if her lower extremity radicular symptoms returned that she could have repeat injections but she has never called back to the office for this. Review of Systems No current cardiac pulmonary GI GU issues  Objective: Vital Signs: BP 138/86 (BP Location: Right Arm, Patient Position: Sitting, Cuff Size: Large)    Pulse 88    SpO2 98%   Physical Exam Constitutional:      Appearance: She is obese.  Eyes:     Extraocular Movements: Extraocular movements intact.  Pulmonary:     Effort: No respiratory distress.  Musculoskeletal:     Comments: Gait is  normal.  Cervical spine good range of motion.  Mild bilateral brachial plexus trapezius tenderness.  Bilateral shoulder exam unremarkable.  No upper extremity motor deficits.  Neurological:     Mental Status: She is alert and oriented to person, place, and time.  Psychiatric:        Mood and Affect: Mood normal.    Ortho Exam  Specialty Comments:  No specialty comments available.  Imaging: No results found.   PMFS History: Patient Active Problem List   Diagnosis Date Noted   Leukocytosis 06/20/2021   Prediabetes 04/23/2018   History of gastroesophageal reflux (GERD) 08/03/2017   History of vitamin D deficiency 99991111   History of Helicobacter pylori infection 08/03/2017   History of urinary tract infection 08/03/2017   Asymptomatic microscopic hematuria 08/03/2017   Chronic abdominal pain 08/03/2017    Left lower quadrant pain 06/05/2016   Hemorrhoid 04/06/2016   Neck pain 10/28/2015   Shoulder pain, bilateral 10/28/2015   Vertigo 07/29/2015   Tachycardia 07/08/2015   Vitamin D deficiency 04/08/2015   Lumbar back pain with radiculopathy affecting left lower extremity 04/07/2015   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   Chronic pain of multiple joints 01/29/2015   Irregular menses 10/01/2014   Allergic rhinitis 10/01/2014   Chalazion of left upper eyelid 10/01/2014   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   Past Medical History:  Diagnosis Date   Irritable bowel syndrome (IBS)    Prediabetes     Family History  Problem Relation Age of Onset   Hypertension Father     Past Surgical History:  Procedure Laterality Date   CIRCUMCISION  as a young girl    Type III femake circumcision    DILATION AND CURETTAGE OF UTERUS  2006   HEMORRHOID SURGERY  2015   HEMORRHOID SURGERY     in [place]   Social History   Occupational History   Not on file  Tobacco Use   Smoking status: Never   Smokeless tobacco: Never  Vaping Use   Vaping Use: Never used  Substance and Sexual Activity   Alcohol use: No   Drug use: No   Sexual activity: Yes

## 2021-10-20 ENCOUNTER — Other Ambulatory Visit: Payer: Self-pay

## 2021-10-20 ENCOUNTER — Ambulatory Visit (INDEPENDENT_AMBULATORY_CARE_PROVIDER_SITE_OTHER): Payer: 59 | Admitting: Physical Therapy

## 2021-10-20 ENCOUNTER — Encounter: Payer: Self-pay | Admitting: Physical Therapy

## 2021-10-20 DIAGNOSIS — M6281 Muscle weakness (generalized): Secondary | ICD-10-CM

## 2021-10-20 DIAGNOSIS — M5442 Lumbago with sciatica, left side: Secondary | ICD-10-CM

## 2021-10-20 DIAGNOSIS — M542 Cervicalgia: Secondary | ICD-10-CM | POA: Diagnosis not present

## 2021-10-20 DIAGNOSIS — M5441 Lumbago with sciatica, right side: Secondary | ICD-10-CM

## 2021-10-20 DIAGNOSIS — G8929 Other chronic pain: Secondary | ICD-10-CM
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
## 2021-10-24 ENCOUNTER — Ambulatory Visit: Payer: 59 | Admitting: Specialist

## 2021-11-01 ENCOUNTER — Other Ambulatory Visit: Payer: Self-pay

## 2021-11-01 ENCOUNTER — Ambulatory Visit (INDEPENDENT_AMBULATORY_CARE_PROVIDER_SITE_OTHER): Payer: 59 | Admitting: Physical Therapy

## 2021-11-01 ENCOUNTER — Encounter: Payer: Self-pay | Admitting: Physical Therapy

## 2021-11-01 DIAGNOSIS — G8929 Other chronic pain: Secondary | ICD-10-CM

## 2021-11-01 DIAGNOSIS — M5442 Lumbago with sciatica, left side: Secondary | ICD-10-CM

## 2021-11-01 DIAGNOSIS — M5441 Lumbago with sciatica, right side: Secondary | ICD-10-CM

## 2021-11-01 DIAGNOSIS — M542 Cervicalgia: Secondary | ICD-10-CM | POA: Diagnosis not present

## 2021-11-01 DIAGNOSIS — R262 Difficulty in walking, not elsewhere classified: Secondary | ICD-10-CM

## 2021-11-01 DIAGNOSIS — M6281 Muscle weakness (generalized): Secondary | ICD-10-CM

## 2021-11-01 NOTE — Therapy (Signed)
OUTPATIENT PHYSICAL THERAPY TREATMENT NOTE   Patient Name: Candice [name] MRN: [id number] DOB:September 29, 1975, 46 y.o., female Today's Date: 11/01/2021  PCP: Gildardo Pounds, NP REFERRING PROVIDER: Lanae Crumbly, PA-C   PT End of Session - 11/01/21 1225     Visit Number 2    Number of Visits 6    Date for PT Re-Evaluation 12/01/21    PT Start Time 1100    PT Stop Time 1147    PT Time Calculation (min) 47 min    Activity Tolerance Patient limited by pain    Behavior During Therapy [facility] for tasks assessed/performed             Past Medical History:  Diagnosis Date   Irritable bowel syndrome (IBS)    Prediabetes    Past Surgical History:  Procedure Laterality Date   CIRCUMCISION  as a young girl    Type III femake circumcision    DILATION AND CURETTAGE OF UTERUS  2006   HEMORRHOID SURGERY  2015   HEMORRHOID SURGERY     in [place]   Patient Active Problem List   Diagnosis Date Noted   Leukocytosis 06/20/2021   Prediabetes 04/23/2018   History of gastroesophageal reflux (GERD) 08/03/2017   History of vitamin D deficiency 99991111   History of Helicobacter pylori infection 08/03/2017   History of urinary tract infection 08/03/2017   Asymptomatic microscopic hematuria 08/03/2017   Chronic abdominal pain 08/03/2017   Left lower quadrant pain 06/05/2016   Hemorrhoid 04/06/2016   Neck pain 10/28/2015   Shoulder pain, bilateral 10/28/2015   Vertigo 07/29/2015   Tachycardia 07/08/2015   Vitamin D deficiency 04/08/2015   Lumbar back pain with radiculopathy affecting left lower extremity 04/07/2015   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   Chronic pain of multiple joints 01/29/2015   Irregular menses 10/01/2014   Allergic rhinitis 10/01/2014   Chalazion of left upper eyelid 10/01/2014   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    THERAPY DIAG:  Cervicalgia  Muscle weakness (generalized)  Chronic midline low back pain with bilateral  sciatica  Difficulty in walking, not elsewhere classified     REFERRING PROVIDER: Lanae Crumbly, PA-C   REFERRING DIAG:  M54.2 (ICD-10-CM) - Neck pain M54.16 (ICD-10-CM) - Lumbar back pain with radiculopathy affecting left lower extremity   ONSET DATE: 5 years ago   SUBJECTIVE:  SUBJECTIVE STATEMENT: Pt. States that the pain in the neck and the back has gotten a little better since the last visit. She states that she has increased neck pain at night time, and her arm feels numb if she sleeps on her side at night. No radicular sx are noted down the legs today.    PERTINENT HISTORY:   DJD, prediabetes, IBS, Tachycardia,    PAIN:  Are you having pain? Yes NPRS scale: 7/10 (at rest), 10/10 with activity Pain location: bilateral neck, low back Pain orientation: Bilateral  PAIN TYPE: achy, tight, numbness and tingling Pain description: constant  Aggravating factors: Laying down on all sides, Walking Relieving factors: over- the- counter medication (does not help all the time), rest   PRECAUTIONS: None   WEIGHT BEARING RESTRICTIONS No   PLOF: Independent   PATIENT GOALS: Decrease pain   OBJECTIVE:  11/01/21 Lumbar ROM: 11/01/2021  AROM AROM (deg) 11/01/2021  Flexion Full ROM w/ concordant pain   Extension Full ROM  Right lateral flexion Full ROM w/ concordant pain  Left lateral flexion Full ROM w/ concordant pain  Right rotation Full ROM   Left rotation Full ROM   Shoulder: 11/01/2021- All shoulder ROM full and pain free  MMT R L  Shoulder Flexion  4-/5 4-/5  Shoulder Abduction 3+/5 3+/5         CERVICAL AROM/PROM   APROM A/PROM (deg) 10/20/2021  Flexion 20 deg.   Extension 25 deg.  Right lateral flexion 10 deg.  Left lateral flexion 10 deg.  Right rotation 25% of  full ROM   Left rotation 25% of full ROM   (Blank rows = not tested)     Cervical MMT: measurements taken using hand- held dynamometer    MMT #'s of force 10/20/2021  Cervical Flexion  7.0  Cervical Extension 8.5  R side Bend 7.0  L Side Bend 7.5   (Blank rows = not tested)    DIAGNOSTIC FINDINGS:  "MRI: disc degeneration and small disc protrusion at L4-5 and L5-S1"   PATIENT SURVEYS:  FOTO 39%        TODAY'S TREATMENT 11/01/21:  Therapeutic Exercise:  Aerobic: UBE both UE & LE, Seat 6 Lvl 2, 6 minutes Supine: Prone:  Seated: Green ball rolling out into lumbar flexion (middle, left, and right); Cervical ext. SNAG with stretch strap 5 sec. Hold x 10; Cervical rotation AAROM bilat. 5 second hold x 10, Cervical Flexion w/ heat pack around neck 5 sec. hold x 10  Standing: Green theraband shoulder rows 2 x 10 bilat. Red theraband shoulder extension 2 x 10 bilat. Green theraband shoulder horizontal abduction 2 x 10 Neuromuscular Re-education: Manual Therapy: Therapeutic Activity: Self Care: Trigger Point Dry Needling:  Modalities: Heat pack around neck with cervical flexion exercise 8 min      PATIENT EDUCATION:  Education details: HEP, how to sleep to increase neck support, recommended sports bras to help decrease neck pain. Person educated: Patient via interpreter Education method: Explanation, Demonstration, Verbal cues, and Handouts Education comprehension: verbalized understanding, returned demonstration, verbal cues required, and needs further education     HOME EXERCISE PROGRAM: Access Code: [email or web address] URL: [email or web address] Date: 10/20/2021 Prepared by: Elsie Ra   Exercises Seated Passive Cervical Retraction - 2 x daily - 6 x weekly - 1-2 sets - 10 reps - 5 sec hold Cervical Extension AROM with Strap - 2 x daily - 6 x weekly - 1-2 sets - 10 reps - 10 sec hold Seated Assisted Cervical  Rotation with Towel - 2 x daily - 6 x weekly - 1 sets -  10 reps - 5 hold Seated Upper Trapezius Stretch - 2 x daily - 6 x weekly - 1 sets - 2-3 reps - 30 sec hold Seated Scapular Retraction - 2 x daily - 6 x weekly - 1 sets - 10 reps - 3 sec hold     ASSESSMENT:   CLINICAL IMPRESSION: Session focused on assessing shoulder ROM & strength measurements, as well as lumbar ROM measurements due to concerns reported at pt's first visit. Pt. exhibits full shoulder and lumbar AROM, however there is concordant pain with specific movements and weakness with bilat shoulder strength. This shoulder weakness may contribute to the pain being felt at the neck in addition to cervical muscle tightness. Cervical ROM and shoulder strengthening exercises were completed today and will continue to be progressed during the pt's follow up PT.     REHAB POTENTIAL: Good   CLINICAL DECISION MAKING: stable/uncomplicated EVALUATION COMPLEXITY: Low GOALS: Short term PT Goals (target date for Short term goals are 4 weeks 11/17/21) Pt will be I and compliant with HEP. Baseline:  Goal status: New Pt will decrease pain by 25% overall Baseline: Goal status: New   Long term PT goals (target dates for all long term goals are 6 weeks 12/01/21) Pt will improve cervical ROM to [facility] to improve functional mobility Baseline: Goal status: New Pt will improve  cervical strength  MMT by 50% or to 15# to improve functional strength Baseline: Goal status: New Pt will improve FOTO to at least 50% functional to show improved function Baseline: 39% currently Goal status: New Pt will reduce pain by overall 50% overall with usual activity Baseline: Goal status: New   PLAN: PT FREQUENCY: 1-2 times per week    PT DURATION: 6 weeks   PLANNED INTERVENTIONS (unless contraindicated): aquatic PT, Canalith repositioning, cryotherapy, Electrical stimulation, Iontophoresis with 4 mg/ml dexamethasome, Moist heat, traction, Ultrasound, gait training, Therapeutic exercise, balance training,  neuromuscular re-education, patient/family education, prosthetic training, manual techniques, passive ROM, dry needling, taping, vasopnuematic device, vestibular, spinal manipulations, joint manipulations   PLAN FOR NEXT SESSION: Update pt's HEP to include shoulder ROM and strength exercises as well.  Ysidro Evert, SPT

## 2021-11-02 ENCOUNTER — Ambulatory Visit: Payer: 59 | Admitting: Physical Medicine and Rehabilitation

## 2021-11-07 ENCOUNTER — Telehealth: Payer: Self-pay | Admitting: Physical Therapy

## 2021-11-07 ENCOUNTER — Encounter: Payer: 59 | Admitting: Physical Therapy

## 2021-11-07 NOTE — Telephone Encounter (Signed)
Pt. Did not show for PT appt. We had interpreter call and leave a message about her missed appt. And when her next appt. Was

## 2021-11-08 ENCOUNTER — Other Ambulatory Visit: Payer: Self-pay

## 2021-11-14 ENCOUNTER — Encounter: Payer: 59 | Admitting: Physical Therapy

## 2021-11-14 ENCOUNTER — Telehealth: Payer: Self-pay | Admitting: Physical Therapy

## 2021-11-14 NOTE — Telephone Encounter (Signed)
Interpreter called pt pertaining missed appt today. Pt stated she thought her appt was later this afternoon. She requested to change her remaining appts to 11:45am. PT scheduled her next appointment to 11:45 at her request and removed her remaining appointments that were at 9:30. We will have her schedule more PT when she comes to 11/21/21 appt.

## 2021-11-15 ENCOUNTER — Ambulatory Visit (INDEPENDENT_AMBULATORY_CARE_PROVIDER_SITE_OTHER): Payer: 59 | Admitting: Physical Medicine and Rehabilitation

## 2021-11-15 ENCOUNTER — Encounter: Payer: Self-pay | Admitting: Physical Medicine and Rehabilitation

## 2021-11-15 ENCOUNTER — Ambulatory Visit: Payer: Self-pay

## 2021-11-15 ENCOUNTER — Other Ambulatory Visit: Payer: Self-pay

## 2021-11-15 VITALS — BP 146/79 | HR 106

## 2021-11-15 DIAGNOSIS — M5416 Radiculopathy, lumbar region: Secondary | ICD-10-CM

## 2021-11-15 MED ORDER — METHYLPREDNISOLONE ACETATE 80 MG/ML IJ SUSP
80.0000 mg | Freq: Once | INTRAMUSCULAR | Status: AC
  Administered 2021-11-15: 80 mg

## 2021-11-15 NOTE — Progress Notes (Signed)
Pt state lower back pain that travels down her left leg. Pt state walking, standing and laying down makes the pain worse. Pt state she takes pain meds and uses heating to help ease her pain.  Numeric Pain Rating Scale and Functional Assessment Average Pain 8   In the last MONTH (on 0-10 scale) has pain interfered with the following?  1. General activity like being  able to carry out your everyday physical activities such as walking, climbing stairs, carrying groceries, or moving a chair?  Rating(10)   +Driver, -BT, -Dye Allergies.

## 2021-11-15 NOTE — Patient Instructions (Signed)

## 2021-11-16 ENCOUNTER — Ambulatory Visit (INDEPENDENT_AMBULATORY_CARE_PROVIDER_SITE_OTHER): Payer: 59 | Admitting: Specialist

## 2021-11-16 ENCOUNTER — Other Ambulatory Visit: Payer: Self-pay

## 2021-11-16 ENCOUNTER — Encounter: Payer: Self-pay | Admitting: Specialist

## 2021-11-16 ENCOUNTER — Telehealth: Payer: Self-pay | Admitting: Specialist

## 2021-11-16 VITALS — BP 130/97 | HR 96 | Ht 60.0 in | Wt 223.0 lb

## 2021-11-16 DIAGNOSIS — M4726 Other spondylosis with radiculopathy, lumbar region: Secondary | ICD-10-CM

## 2021-11-16 DIAGNOSIS — M5441 Lumbago with sciatica, right side: Secondary | ICD-10-CM | POA: Diagnosis not present

## 2021-11-16 DIAGNOSIS — M51369 Other intervertebral disc degeneration, lumbar region without mention of lumbar back pain or lower extremity pain: Secondary | ICD-10-CM

## 2021-11-16 DIAGNOSIS — M5416 Radiculopathy, lumbar region: Secondary | ICD-10-CM | POA: Diagnosis not present

## 2021-11-16 DIAGNOSIS — M542 Cervicalgia: Secondary | ICD-10-CM

## 2021-11-16 DIAGNOSIS — M222X2 Patellofemoral disorders, left knee: Secondary | ICD-10-CM

## 2021-11-16 DIAGNOSIS — R29898 Other symptoms and signs involving the musculoskeletal system: Secondary | ICD-10-CM | POA: Diagnosis not present

## 2021-11-16 DIAGNOSIS — M5136 Other intervertebral disc degeneration, lumbar region: Secondary | ICD-10-CM

## 2021-11-16 DIAGNOSIS — M5116 Intervertebral disc disorders with radiculopathy, lumbar region: Secondary | ICD-10-CM | POA: Diagnosis not present

## 2021-11-16 DIAGNOSIS — G8929 Other chronic pain: Secondary | ICD-10-CM

## 2021-11-16 DIAGNOSIS — M222X1 Patellofemoral disorders, right knee: Secondary | ICD-10-CM

## 2021-11-16 MED ORDER — ALPRAZOLAM 0.25 MG PO TABS
ORAL_TABLET | ORAL | 0 refills | Status: DC
  Filled 2021-11-16: qty 30, 30d supply, fill #0

## 2021-11-16 NOTE — Progress Notes (Signed)
Office Visit Note   Patient: Candice [name]           Date of Birth: 1976-07-01           MRN: [id number] Visit Date: 11/16/2021              Requested by: Claiborne Rigg, NP [address] [place],  [place] [address] PCP: Claiborne Rigg, NP   Assessment & Plan: Visit Diagnoses:  1. Lumbar back pain with radiculopathy affecting left lower extremity   2. Chronic right-sided low back pain with right-sided sciatica   3. Weakness of right leg   4. Herniation of lumbar intervertebral disc with radiculopathy   5. Other spondylosis with radiculopathy, lumbar region   6. Degenerative disc disease, lumbar   7. Subacute right lumbar radiculopathy   8. Neck pain   9. Cervicalgia   10. Patellofemoral pain syndrome of both knees     Plan: Avoid overhead lifting and overhead use of the arms. Do not lift greater than 5 lbs. Adjust head rest in vehicle to prevent hyperextension if rear ended. Take extra precautions to avoid falling. Knee is suffering from osteoarthritis, only real proven treatments are Weight loss, NSIADs like diclofenac gel and exercise. Well padded shoes help. Ice the knee theis suffering from osteoarthritis, only real proven treatments are PT for the neck and knees is ordered MRI of the cervical spine is ordered.  Ice the knee 2-3 times a day 15-20 mins at a time.-3 times a day 15-20 mins at a time. Hot showers in the AM.  Injection with steroid may be of benefit. Hemp CBD capsules, [email or web address] 5,000-7,000 mg per bottle, 60 capsules per bottle, take one capsule twice a day. Cane in the left hand to use with left leg weight bearing. Follow-Up Instructions: No follow-ups on file.    Follow-Up Instructions: Return in about 3 weeks (around 12/07/2021).   Orders:  No orders of the defined types were placed in this encounter.  No orders of the defined types were placed in this encounter.     Procedures: No procedures performed   Clinical Data: Findings:   Narrative & Impression CLINICAL DATA:  Low back pain for greater than 6 weeks which is progressive. Pain worse on the right than the left with lower extremity radiculopathy.   EXAM: MRI LUMBAR SPINE WITHOUT CONTRAST   TECHNIQUE: Multiplanar, multisequence MR imaging of the lumbar spine was performed. No intravenous contrast was administered.   COMPARISON:  Radiography 06/02/2021.  MRI 02/16/2015.   FINDINGS: Segmentation:  5 lumbar type vertebral bodies.   Alignment:  Minimal scoliotic curvature convex to the right.   Vertebrae:  No fracture or focal lesion.   Conus medullaris and cauda equina: Conus extends to the T12-L1 level. Conus and cauda equina appear normal.   Paraspinal and other soft tissues: Negative   Disc levels:   No abnormality at L2-3 or above.   L3-4: Mild bulging of the disc. No compressive stenosis. No change since 2016.   L4-5: Disc degeneration with loss of disc height. Chronic shallow disc protrusion with slight caudal down turning. Slight indentation of the thecal sac and mild stenosis of the lateral recesses but without definite neural compression. No progressive change since 2016. The disc material has desiccated slightly.   L5-S1: Disc degeneration with bulging of the disc and a shallow protrusion towards the left. Mild facet and ligamentous hypertrophy. Stenosis of the subarticular lateral recess on the left that could possibly affect the  left S1 nerve. The left subarticular lateral recess stenosis may be slightly more pronounced than was seen previously. The disc material has desiccated slightly.   IMPRESSION: L3-4: Minor, non-compressive disc bulge, unchanged.   L4-5: Shallow disc protrusion with slight caudal down turning. Slight indentation of the thecal sac and mild stenosis of both lateral recesses but no definite neural compression. No worsening since 2015-01-28. The disc material has desiccated slightly.   L5-S1: Shallow disc  protrusion slightly more prominent towards the left. Stenosis of the subarticular lateral recess on the left that could possibly affect the left S1 nerve. The subarticular lateral recess narrowing may be slightly more pronounced than was seen in 01-28-2015. The disc material at this level has also desiccated slightly.     Electronically Signed   By: Paulina Fusi M.D.   On: 06/07/2021 08:06       Subjective: Chief Complaint  Patient presents with   Neck - Follow-up   Lower Back - Follow-up    46 year old female with history of lumbar degenerative disc disease and small disc protrusions with ongoing leg pain and numbness. She underwent ESI on the lumbar spine yesterday by Dr. Alvester Morin. She is seen today complaining of neck pain and also pain into both knees. The knees are painful with  Squatting and climbing stairs. She notices the pain with walking also. No bowel or bladder difficulty. Her neck pain is constant and there is night pain. She reports the pain on a scale of 1-10 is a "9". She reports only sleeping for 15 min at a time and awakening with neck pain. She is able to walk but with discomfort.  She feels weak and has numbness in both arms. Pain with moving the neck. There is since Nov through Dec with menorrhagia.    Review of Systems  Constitutional: Negative.   HENT: Negative.    Eyes: Negative.   Respiratory: Negative.    Cardiovascular: Negative.   Gastrointestinal: Negative.   Endocrine: Negative.   Genitourinary: Negative.   Musculoskeletal: Negative.   Skin: Negative.   Allergic/Immunologic: Negative.   Neurological: Negative.   Hematological: Negative.   Psychiatric/Behavioral: Negative.      Objective: Vital Signs: BP (!) 130/97 (BP Location: Left Arm, Patient Position: Sitting)    Pulse 96    Ht 5' (1.524 m)    Wt 223 lb (101.2 kg)    BMI 43.55 kg/m   Physical Exam Constitutional:      Appearance: She is well-developed.  HENT:     Head: Normocephalic and  atraumatic.  Eyes:     Pupils: Pupils are equal, round, and reactive to light.  Pulmonary:     Effort: Pulmonary effort is normal.     Breath sounds: Normal breath sounds.  Abdominal:     General: Bowel sounds are normal.     Palpations: Abdomen is soft.  Musculoskeletal:     Cervical back: Normal range of motion and neck supple.     Lumbar back: Negative right straight leg raise test and negative left straight leg raise test.  Skin:    General: Skin is warm and dry.  Neurological:     Mental Status: She is alert and oriented to person, place, and time.  Psychiatric:        Behavior: Behavior normal.        Thought Content: Thought content normal.        Judgment: Judgment normal.    Back Exam   Tenderness  The patient is experiencing tenderness in the lumbar.  Range of Motion  Extension:  abnormal  Flexion:  abnormal  Lateral bend right:  abnormal  Rotation right:  abnormal  Rotation left:  abnormal   Muscle Strength  Right Quadriceps:  5/5  Right Hamstrings:  5/5  Left Hamstrings:  5/5   Tests  Straight leg raise right: negative Straight leg raise left: negative  Reflexes  Patellar:  1/4 Achilles:  1/4  Comments:  ROM of the neck is mildly decreased turning to the left and with bending to the left. The left side is more uncomfortable than the right  but sometimes on both.  Motor without focal deficity.      Specialty Comments:  No specialty comments available.  Imaging: XR C-ARM NO REPORT  Result Date: 11/15/2021 Please see Notes tab for imaging impression.    PMFS History: Patient Active Problem List   Diagnosis Date Noted   Leukocytosis 06/20/2021   Prediabetes 04/23/2018   History of gastroesophageal reflux (GERD) 08/03/2017   History of vitamin D deficiency 08/03/2017   History of Helicobacter pylori infection 08/03/2017   History of urinary tract infection 08/03/2017   Asymptomatic microscopic hematuria 08/03/2017   Chronic abdominal pain  08/03/2017   Left lower quadrant pain 06/05/2016   Hemorrhoid 04/06/2016   Neck pain 10/28/2015   Shoulder pain, bilateral 10/28/2015   Vertigo 07/29/2015   Tachycardia 07/08/2015   Vitamin D deficiency 04/08/2015   Lumbar back pain with radiculopathy affecting left lower extremity 04/07/2015   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   Chronic pain of multiple joints 01/29/2015   Irregular menses 10/01/2014   Allergic rhinitis 10/01/2014   Chalazion of left upper eyelid 10/01/2014   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   Past Medical History:  Diagnosis Date   Irritable bowel syndrome (IBS)    Prediabetes     Family History  Problem Relation Age of Onset   Hypertension Father     Past Surgical History:  Procedure Laterality Date   CIRCUMCISION  as a young girl    Type III femake circumcision    DILATION AND CURETTAGE OF UTERUS  2006   HEMORRHOID SURGERY  2015   HEMORRHOID SURGERY     in [place]   Social History   Occupational History   Not on file  Tobacco Use   Smoking status: Never   Smokeless tobacco: Never  Vaping Use   Vaping Use: Never used  Substance and Sexual Activity   Alcohol use: No   Drug use: No   Sexual activity: Yes

## 2021-11-16 NOTE — Telephone Encounter (Signed)
[facility] [address] please  ?

## 2021-11-16 NOTE — Telephone Encounter (Signed)
Pt states she is suppose to have a script filled from [facility]. Does not know the name  ?

## 2021-11-16 NOTE — Patient Instructions (Signed)
Avoid overhead lifting and overhead use of the arms. ?Do not lift greater than 5 lbs. ?Adjust head rest in vehicle to prevent hyperextension if rear ended. ?Take extra precautions to avoid falling. ?Knee is suffering from osteoarthritis, only real proven treatments are ?Weight loss, NSIADs like diclofenac gel and exercise. ?Well padded shoes help. ?Ice the knee theis suffering from osteoarthritis, only real proven treatments are ?PT for the neck and knees is ordered ?MRI of the cervical spine is ordered.  ?Ice the knee 2-3 times a day 15-20 mins at a time.-3 times a day 15-20 mins at a time. Hot showers in the AM.  ?Injection with steroid may be of benefit. ?Hemp CBD capsules, [email or web address] 5,000-7,000 mg per bottle, 60 capsules per bottle, take one capsule twice a day. ?Cane in the left hand to use with left leg weight bearing. ?Follow-Up Instructions: No follow-ups on file.   ?

## 2021-11-17 ENCOUNTER — Other Ambulatory Visit: Payer: Self-pay

## 2021-11-17 ENCOUNTER — Other Ambulatory Visit: Payer: Self-pay | Admitting: Specialist

## 2021-11-17 MED ORDER — ALPRAZOLAM 0.25 MG PO TABS: ORAL_TABLET | ORAL | 0 refills | Status: DC

## 2021-11-21 ENCOUNTER — Other Ambulatory Visit: Payer: Self-pay

## 2021-11-21 ENCOUNTER — Ambulatory Visit (INDEPENDENT_AMBULATORY_CARE_PROVIDER_SITE_OTHER): Payer: 59 | Admitting: Physical Therapy

## 2021-11-21 ENCOUNTER — Encounter: Payer: Self-pay | Admitting: Physical Therapy

## 2021-11-21 ENCOUNTER — Encounter: Payer: 59 | Admitting: Physical Therapy

## 2021-11-21 DIAGNOSIS — R262 Difficulty in walking, not elsewhere classified: Secondary | ICD-10-CM | POA: Diagnosis not present

## 2021-11-21 DIAGNOSIS — M5441 Lumbago with sciatica, right side: Secondary | ICD-10-CM | POA: Diagnosis not present

## 2021-11-21 DIAGNOSIS — M6281 Muscle weakness (generalized): Secondary | ICD-10-CM

## 2021-11-21 DIAGNOSIS — M542 Cervicalgia: Secondary | ICD-10-CM

## 2021-11-21 DIAGNOSIS — M5442 Lumbago with sciatica, left side: Secondary | ICD-10-CM

## 2021-11-21 DIAGNOSIS — G8929 Other chronic pain: Secondary | ICD-10-CM

## 2021-11-21 NOTE — Therapy (Signed)
OUTPATIENT PHYSICAL THERAPY TREATMENT NOTE   Patient Name: Candice [name] MRN: [id number] DOB:[birth year], 46 y.o., female Today's Date: 11/21/2021  PCP: Claiborne Rigg, NP REFERRING PROVIDER: Naida Sleight, PA-C   PT End of Session - 11/21/21 1152     Visit Number 3    Number of Visits 6    Date for PT Re-Evaluation 12/01/21    PT Start Time 1145    PT Stop Time 1225    PT Time Calculation (min) 40 min    Activity Tolerance Patient limited by pain    Behavior During Therapy [facility] for tasks assessed/performed              Past Medical History:  Diagnosis Date   Irritable bowel syndrome (IBS)    Prediabetes    Past Surgical History:  Procedure Laterality Date   CIRCUMCISION  as a young girl    Type III femake circumcision    DILATION AND CURETTAGE OF UTERUS  2006   HEMORRHOID SURGERY  2015   HEMORRHOID SURGERY     in [place]   Patient Active Problem List   Diagnosis Date Noted   Leukocytosis 06/20/2021   Prediabetes 04/23/2018   History of gastroesophageal reflux (GERD) 08/03/2017   History of vitamin D deficiency 08/03/2017   History of Helicobacter pylori infection 08/03/2017   History of urinary tract infection 08/03/2017   Asymptomatic microscopic hematuria 08/03/2017   Chronic abdominal pain 08/03/2017   Left lower quadrant pain 06/05/2016   Hemorrhoid 04/06/2016   Neck pain 10/28/2015   Shoulder pain, bilateral 10/28/2015   Vertigo 07/29/2015   Tachycardia 07/08/2015   Vitamin D deficiency 04/08/2015   Lumbar back pain with radiculopathy affecting left lower extremity 04/07/2015   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   Chronic pain of multiple joints 01/29/2015   Irregular menses 10/01/2014   Allergic rhinitis 10/01/2014   Chalazion of left upper eyelid 10/01/2014   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 Yes NPRS scale: 8/10 (at rest), 10/10 with activity Pain location: shoulders and upper back Pain orientation: Bilateral  PAIN TYPE: achy, tight, numbness and tingling Pain description: constant  Aggravating factors: Laying down on all sides, Walking Relieving factors: over- the- counter medication (does not help all the time), rest   PRECAUTIONS: None   WEIGHT BEARING RESTRICTIONS No   PLOF: Independent   PATIENT GOALS: Decrease pain   OBJECTIVE:  11/01/21 Lumbar ROM: 11/01/2021  AROM AROM (deg) 11/01/2021  Flexion Full ROM w/ concordant pain   Extension Full ROM  Right lateral flexion Full ROM w/ concordant pain  Left lateral flexion Full ROM w/ concordant pain  Right rotation Full ROM   Left rotation Full ROM   Shoulder: 11/01/2021- All shoulder ROM full and pain free  MMT R L  Shoulder Flexion  4-/5 4-/5  Shoulder Abduction 3+/5 3+/5         CERVICAL AROM/PROM   APROM A/PROM (deg) 10/20/2021  Flexion 20 deg.   Extension 25 deg.  Right lateral flexion 10 deg.  Left lateral flexion 10 deg.  Right rotation 25% of full ROM   Left rotation 25% of full ROM   (Blank rows = not tested)     Cervical MMT: measurements taken using hand- held dynamometer    MMT #'s of force 10/20/2021  Cervical Flexion  7.0  Cervical Extension  8.5  R side Bend 7.0  L Side Bend 7.5   (Blank rows = not tested)    DIAGNOSTIC FINDINGS:  "MRI: disc degeneration and small disc protrusion at L4-5 and L5-S1"   PATIENT SURVEYS:  FOTO 39%        TODAY'S TREATMENT  11/21/21 Therex:  NuStep L5 x 8 min   SKTC 3x30 sec bil   Supine cervical retraction 10 x 10 sec hold  Overhead pull with L3 band 2x10  Supine isometric cervical sidebending 10 x 5 sec hold bil   Seated cervical extension with towel 10 x 10 sec hold  Seated Cervical rotation AAROM bilat. 10 second hold x 10  Upper trap stretch 3x20 sec hold bil  Seated scapular retraction 10 x 5 sec hold  Seated bil ER with L3 band x 10 reps   Standing rows L3 band x 10 reps Standing extension L3 band x 10 reps      11/01/21:  Therapeutic Exercise:  Aerobic: UBE both UE & LE, Seat 6 Lvl 2, 6 minutes Supine: Prone:  Seated: Green ball rolling out into lumbar flexion (middle, left, and right); Cervical ext. SNAG with stretch strap 5 sec. Hold x 10; Cervical rotation AAROM bilat. 5 second hold x 10, Cervical Flexion w/ heat pack around neck 5 sec. hold x 10  Standing: Green theraband shoulder rows 2 x 10 bilat. Red theraband shoulder extension 2 x 10 bilat. Green theraband shoulder horizontal abduction 2 x 10 Neuromuscular Re-education: Manual Therapy: Therapeutic Activity: Self Care: Trigger Point Dry Needling:  Modalities: Heat pack around neck with cervical flexion exercise 8 min      PATIENT EDUCATION:  Education details: HEP, how to sleep to increase neck support, recommended sports bras to help decrease neck pain. Person educated: Patient via interpreter Education method: Explanation, Demonstration, Verbal cues, and Handouts Education comprehension: verbalized understanding, returned demonstration, verbal cues required, and needs further education  HOME EXERCISE PROGRAM: Access Code: [email or web address] URL: [email or web address] Date:  10/20/2021 Prepared by: Ivery Quale   Exercises Seated Passive Cervical Retraction - 2 x daily - 6 x weekly - 1-2 sets - 10 reps - 5 sec hold Cervical Extension AROM with Strap - 2 x daily - 6 x weekly - 1-2 sets - 10 reps - 10 sec hold Seated Assisted Cervical Rotation with Towel - 2 x daily - 6 x weekly - 1 sets - 10 reps - 5 hold Seated Upper Trapezius Stretch - 2 x daily - 6 x weekly - 1 sets - 2-3 reps - 30 sec hold Seated Scapular Retraction - 2 x daily - 6 x weekly - 1 sets - 10 reps - 3 sec hold     ASSESSMENT:   CLINICAL IMPRESSION: Pt tolerated session well today without increase in pain or symptoms.  Session focused  on postural exercises.  She c/o of some Lt wrist pain which seems to be coming from her wrist flexors.  Will continue to benefit from PT to maximize function.   REHAB POTENTIAL: Good   CLINICAL DECISION MAKING: stable/uncomplicated EVALUATION COMPLEXITY: Low GOALS: Short term PT Goals (target date for Short term goals are 4 weeks 11/17/21) Pt will be I and compliant with HEP. Baseline:  Goal status: New Pt will decrease pain by 25% overall Baseline: Goal status: New   Long term PT goals (target dates for all long term goals are 6 weeks 12/01/21) Pt will improve cervical ROM to [facility] to improve functional mobility Baseline: Goal status: New Pt will improve  cervical strength  MMT by 50% or to 15# to improve functional strength Baseline: Goal status: New Pt will improve FOTO to at least 50% functional to show improved function Baseline: 39% currently Goal status: New Pt will reduce pain by overall 50% overall with usual activity Baseline: Goal status: New   PLAN: PT FREQUENCY: 1-2 times per week    PT DURATION: 6 weeks   PLANNED INTERVENTIONS (unless contraindicated): aquatic PT, Canalith repositioning, cryotherapy, Electrical stimulation, Iontophoresis with 4 mg/ml dexamethasome, Moist heat, traction, Ultrasound, gait training, Therapeutic exercise,  balance training, neuromuscular re-education, patient/family education, prosthetic training, manual techniques, passive ROM, dry needling, taping, vasopnuematic device, vestibular, spinal manipulations, joint manipulations   PLAN FOR NEXT SESSION: give new exercises if pt not too sore after today's session, monitor wrist pain, continue with postural exercises   Clarita Crane, PT, DPT 11/21/21 12:27 PM

## 2021-11-24 ENCOUNTER — Encounter: Payer: Self-pay | Admitting: Physician Assistant

## 2021-11-24 ENCOUNTER — Other Ambulatory Visit: Payer: Self-pay

## 2021-11-24 ENCOUNTER — Ambulatory Visit: Payer: 59 | Attending: Nurse Practitioner | Admitting: Physician Assistant

## 2021-11-24 VITALS — BP 130/85 | HR 97 | Resp 18 | Ht 60.0 in | Wt 217.4 lb

## 2021-11-24 DIAGNOSIS — R0683 Snoring: Secondary | ICD-10-CM | POA: Diagnosis not present

## 2021-11-24 DIAGNOSIS — Z789 Other specified health status: Secondary | ICD-10-CM | POA: Diagnosis not present

## 2021-11-24 DIAGNOSIS — R0982 Postnasal drip: Secondary | ICD-10-CM

## 2021-11-24 DIAGNOSIS — R7303 Prediabetes: Secondary | ICD-10-CM | POA: Diagnosis not present

## 2021-11-24 DIAGNOSIS — H9203 Otalgia, bilateral: Secondary | ICD-10-CM | POA: Diagnosis not present

## 2021-11-24 DIAGNOSIS — E1169 Type 2 diabetes mellitus with other specified complication: Secondary | ICD-10-CM

## 2021-11-24 DIAGNOSIS — E669 Obesity, unspecified: Secondary | ICD-10-CM

## 2021-11-24 MED ORDER — FLUTICASONE PROPIONATE 50 MCG/ACT NA SUSP: 2.0000 | Freq: Every day | NASAL | 6 refills | Status: DC

## 2021-11-24 MED ORDER — ONETOUCH VERIO REFLECT W/DEVICE KIT: 1.0000 | PACK | Freq: Every day | 0 refills | Status: AC

## 2021-11-24 MED ORDER — METFORMIN HCL 500 MG PO TABS: 500.0000 mg | ORAL_TABLET | Freq: Every day | ORAL | 1 refills | Status: DC

## 2021-11-24 MED ORDER — ONETOUCH VERIO VI STRP: ORAL_STRIP | 12 refills | Status: DC

## 2021-11-24 NOTE — Patient Instructions (Addendum)
Postnasal Drip Postnasal drip is the feeling of mucus going down the back of your throat. Mucus is a slimy substance that moistens and cleans your nose and throat, as well as the air pockets in face bones near your forehead and cheeks (sinuses). Small amounts of mucus pass from your nose and sinuses down the back of your throat all the time. This is normal. When you produce too much mucus or the mucus gets too thick, you can feel it. Some common causes of postnasal drip include: Having more mucus because of: A cold or the flu. Allergies. Cold air. Certain medicines. Having more mucus that is thicker because of: A sinus or nasal infection. Dry air. A food allergy. Follow these instructions at home: Relieving discomfort  Gargle with a salt-water mixture 3-4 times a day or as needed. To make a salt-water mixture, completely dissolve -1 tsp of salt in 1 cup of warm water. If the air in your home is dry, use a humidifier to add moisture to the air. Use a saline spray or container (neti pot) to flush out the nose (nasal irrigation). These methods can help clear away mucus and keep the nasal passages moist. General instructions Take over-the-counter and prescription medicines only as told by your health care provider. Follow instructions from your health care provider about eating or drinking restrictions. You may need to avoid caffeine. Avoid things that you know you are allergic to (allergens), like dust, mold, pollen, pets, or certain foods. Drink enough fluid to keep your urine pale yellow. Keep all follow-up visits as told by your health care provider. This is important. Contact a health care provider if: You have a fever. You have a sore throat. You have difficulty swallowing. You have headache. You have sinus pain. You have a cough that does not go away. The mucus from your nose becomes thick and is green or yellow in color. You have cold or flu symptoms that last more than 10  days. Summary Postnasal drip is the feeling of mucus going down the back of your throat. If your health care provider approves, use nasal irrigation or a nasal spray 2?4 times a day. Avoid things that you know you are allergic to (allergens), like dust, mold, pollen, pets, or certain foods. This information is not intended to replace advice given to you by your health care provider. Make sure you discuss any questions you have with your health care provider. Document Revised: 06/15/2020 Document Reviewed: 06/15/2020 Elsevier Patient Education  2022 [employer] Inc. Eustachian Tube Dysfunction Eustachian tube dysfunction refers to a condition in which a blockage develops in the narrow passage that connects the middle ear to the back of the nose (eustachian tube). The eustachian tube regulates air pressure in the middle ear by letting air move between the ear and nose. It also helps to drain fluid from the middle ear space. Eustachian tube dysfunction can affect one or both ears. When the eustachian tube does not function properly, air pressure, fluid, or both can build up in the middle ear. What are the causes? This condition occurs when the eustachian tube becomes blocked or cannot open normally. Common causes of this condition include: Ear infections. Colds and other infections that affect the nose, mouth, and throat (upper respiratory tract). Allergies. Irritation from cigarette smoke. Irritation from stomach acid coming up into the esophagus (gastroesophageal reflux). The esophagus is the part of the body that moves food from the mouth to the stomach. Sudden changes in air pressure,  such as from descending in an airplane or scuba diving. Abnormal growths in the nose or throat, such as: Growths that line the nose (nasal polyps). Abnormal growth of cells (tumors). Enlarged tissue at the back of the throat (adenoids). What increases the risk? You are more likely to develop this condition  if: You smoke. You are overweight. You are a child who has: Certain birth defects of the mouth, such as cleft palate. Large tonsils or adenoids. What are the signs or symptoms? Common symptoms of this condition include: A feeling of fullness in the ear. Ear pain. Clicking or popping noises in the ear. Ringing in the ear (tinnitus). Hearing loss. Loss of balance. Dizziness. Symptoms may get worse when the air pressure around you changes, such as when you travel to an area of high elevation, fly on an airplane, or go scuba diving. How is this diagnosed? This condition may be diagnosed based on: Your symptoms. A physical exam of your ears, nose, and throat. Tests, such as those that measure: The movement of your eardrum. Your hearing (audiometry). How is this treated? Treatment depends on the cause and severity of your condition. In mild cases, you may relieve your symptoms by moving air into your ears. This is called "popping the ears." In more severe cases, or if you have symptoms of fluid in your ears, treatment may include: Medicines to relieve congestion (decongestants). Medicines that treat allergies (antihistamines). Nasal sprays or ear drops that contain medicines that reduce swelling (steroids). A procedure to drain the fluid in your eardrum. In this procedure, a small tube may be placed in the eardrum to: Drain the fluid. Restore the air in the middle ear space. A procedure to insert a balloon device through the nose to inflate the opening of the eustachian tube (balloon dilation). Follow these instructions at home: Lifestyle Do not do any of the following until your health care provider approves: Travel to high altitudes. Fly in airplanes. Work in a Estate agent or room. Scuba dive. Do not use any products that contain nicotine or tobacco. These products include cigarettes, chewing tobacco, and vaping devices, such as e-cigarettes. If you need help quitting, ask  your health care provider. Keep your ears dry. Wear fitted earplugs during showering and bathing. Dry your ears completely after. General instructions Take over-the-counter and prescription medicines only as told by your health care provider. Use techniques to help pop your ears as recommended by your health care provider. These may include: Chewing gum. Yawning. Frequent, forceful swallowing. Closing your mouth, holding your nose closed, and gently blowing as if you are trying to blow air out of your nose. Keep all follow-up visits. This is important. Contact a health care provider if: Your symptoms do not go away after treatment. Your symptoms come back after treatment. You are unable to pop your ears. You have: A fever. Pain in your ear. Pain in your head or neck. Fluid draining from your ear. Your hearing suddenly changes. You become very dizzy. You lose your balance. Get help right away if: You have a sudden, severe increase in any of your symptoms. Summary Eustachian tube dysfunction refers to a condition in which a blockage develops in the eustachian tube. It can be caused by ear infections, allergies, inhaled irritants, or abnormal growths in the nose or throat. Symptoms may include ear pain or fullness, hearing loss, or ringing in the ears. Mild cases are treated with techniques to unblock the ears, such as yawning or chewing gum.  More severe cases are treated with medicines or procedures. This information is not intended to replace advice given to you by your health care provider. Make sure you discuss any questions you have with your health care provider. Document Revised: 11/15/2020 Document Reviewed: 11/15/2020 Elsevier Patient Education  2022 Elsevier Inc. ??????? ?? ?????? ????? ?? ????? ????? Living With Sleep Apnea ?????? ????? ?? ????? ????? ???? ????? ???? ?????? ?? ???? ?????? ?????? ?? ???? ????? ????? ?? ????? ?????. ??????? ???? ?????? ?? ??????? ?? ?????  ?????? ?????? ????? ?????? ????? ?? ????? ?????. ????? ?? ????? ???????? ??????? ????? ?? ????? ????? ?? ?????? ???? ?????. ??? ???? ?????? ?? ?????? ?? ??? ??????? ????? ?????? ???? 10 ????? ?? ???? ?? ????? ?????. ??? ????? ??? ?????? ??? ???? ?? ????? ?????. ???? ???? ????? ?????? ????? ???? ????? ?????? ?????? ???? ????? ???? ?????? ???????. ??? ??? ?? ?????? ?????? ???? ?????? ?????? ??? ???? ?? ???? ?????? ??? ???? ?????? ?? ????? ??????. ?? ????? ????? ?? ???? ?? ?????? ??????? ?????? ???? ?????? ??? ???? ?????? ?? ????????. ??? ???? ?? ???? ?????? ????? ?? ????? ????? ????? ???? ?????? ????? ?? ????? ????? ?? ??? ????? ?????? ???? ????? (??????? ?? ????? ??????). ??? ???? ?? ???? ????? ?? ??? ?????? ?????? ????? ???:  ?????? ???????.  ?????? ????????.  ??????? ???????.  ??? ?????? ?? ????? 2.  ??? ?????.  ??? ?????? ????? ?????.  ?????? ??? ????. ??? ??? ????? ?? ????? ?????? ????? ?????? ????? ?? ????? ?????? ??? ???? ???? ???? ??? ???:  ??? ?????? ?? ??????? ?? ?????.  ????? ?? ????? ???????.  ?????? ????? ?? ????????.  ??????? ????????? ?? ?????.  ??????? ?????? ??????. ?? ????????? ???? ?????? ??????? ??????? ??? ?????? ????? ?? ????? ?????? ???? ?????? ????? ?? ????? ?????   ??? ??? ?? ???? ??????? ?????? ?????? ???? ???? ?????? ?? ????? ?????? ??? ??????? ??? ??? ????????. ?? ???? ??????: ? ???? ??????? ?? ????. ??? ???? ???? ????? ??????? ???? ???? ?????? ??? ??????. ? ???? ??? ???? ?????? ???????? ??????? (CPAP). ?????? ??? ?????? ?????? ?? ???? ???? ??? ??????. ? ???? ??? ???? ?????? ???????? ??????? ?????? (EPAP). ???? ???? ?? ?????? ???? ?? ????? ?????. ? ???? ??? ???? ?????? ???????? ????? ??????? (BIPAP). ?????? ??? ?????? ?????? ??? ???? ??? ?????? ???????.  ?? ???? ?????? ??? ??????? ?? ???? ??? ???? ???????? ?????? ???. ????? ?????  ???? ????? ??????? ?? ?????? ???? ??????? ?? ???. ????? ??? ??? ??? ?????? ???????? (????? ?????? ??????????) ?????. ? ??? ???? ????  ????? ?? ???????? ??? ?? ????? ????? ???????? ????? ????????? ?????? ???? ?????? ?? ??? ????????? ???????.  ???? ????? ???? 7 ??? 9 ????? ??? ????? ?? ????.  ???? ?? ??????? ????? ?????????? ???????? ???????? ???????? ???????? ??? ???? ????? ???? ?????.  ?? ???? ?????? ????? ?? ????? ??????. ?? ???? ?????? ???? ?? 30 ?????.  ???? ??? ?????? ??? ????? ???? ???? ?????. ???? ?? ?????? ??????? ?? ???????? ??? ???????? ??? ????????? ??????.  ?? ?????? ???? ????? ??? ????? ???. ? ?? ??? ???? ????????? ?????????? ?? ???? ?????. ? ???? ??? ?? ???? ???? ???? ?????? ??????? ??????. ? ?????? ????? ??????? ?????.  ???? ??????? ???? ??????? ?????? ??????? ?? ???? ????????? ?????? ?? ????? ?????. ???????  ?? ?????? ????? ????? ?? ??????.  ?? ?????? ???????? ?? ??? ????? ?? ?????. ???? ?? ????? ???? ???????? ????? ?? 5 ??????.  ???? ??????? ???? ??????? ?????? ??????? ?? ?? ??????? ??????? ????? ?? ??????? ?? ?????? ???????. ??? ??????    ?? ?????? ?????? ??? ?????. ?? ???? ?????? ????? ?? ???????? ???? ?????? ?????? ?? ????? ????? ?????? ????? ?? ?????.  ?? ?????? ?????? ????? ??? ????????? ?? ????? ???????. ????? ??? ??????? ???? ????? ?????? ??????? ???????????? ??? ??????? ???????????. ????? ???? ??????? ?????? ??? ??? ????? ??? ???????? ??????? ?? ???????. ???????  ????? ??????? ??? ??????? ???? ??????? ??????? ????? ????? ????? ???? ?? ??? ???? ????.  ?? ?????? ???????? ???? ???? ??? ???? ????. ??? ????? ??? ??????? ??? ???????? ????? ???? ?? ???? ??? ????? ?????? ????? ?? ????? ?????.  ?? ?????? ???????? ??? ???????? ?????????? ?? ?? ????? ???? ??????? ?????? ??????? ?? ????. ??????  ???? ??? ?????? ??????? ?? ???? ??????? ???? ???? ???????? ?? ??????. ?????? ??????? ?????? ?? ???? ????? ???? ?? ???? ????.  ???? ?????? ?????? ?? ????. ????? ????? ??????? ??? ????? ????? ?????? ??????????. ??????? ????  ???? ??? ????? ????? ?????? ??????? ??? ??? ???.  ????? ????? ?????? ????????. ???? ???  ???.  ??? ??? ???? ?????? ??????? ????? ?? ????? ???? ??????? ?????? ??????? ?? ???? ????? ?? ?????? ????? ?? ????? ?????. ?? ????? ??? ????? ????? ???. ????? ?????? ??? ?????? ?? ?????????: ????? ????? ?????? ?? ?????? ?????? ?????? ???????? ?? ????? ?????? ??:  ??????? ????????? ????? (  American Sleep Association):? [email or web address]  ??????? ??????? ????? (National Sleep Foundation)? : [email or web address]  ?????? ?????? ????? ?????? ????? (National Heart, Lung, and Blood Institute):? [email or web address] ????  ?????? ????? ?? ????? ????? ???? ????? ???? ?????? ?? ???? ?????? ?????? ?? ???? ????? ????? ?? ????? ?????.  ???? ?? ???? ?????? ????? ?? ????? ????? ??????? ??????? ?????? ???? ????? ????.  ?? ????? ??? ?????? ???? ?? ????? ????? ???????? ?? ????? ???? ?????? ???????.  ??? ??? ???? ?????? ??????? ????? ?? ????? ???? ??????? ?????? ??????? ?? ???? ????? ?? ?????? ????? ?? ????? ?????. ?? ????? ??? ????? ????? ???.  ???? ?? ?????? ????? ??????? ??? ????? ?????? ??????? ???????? ???? ??????? ??????? ?? ??????? ??? ?????? ????? ?? ????? ?????. ??? ????? ?? ??? ????????? ?? ???? ?????? ????????? ???? ?????? ???? ??????? ??????. ???? ?? ?????? ??? ????? ???? ?? ???? ?? ???? ??????? ??????.? Document Revised: 05/06/2021 Document Reviewed: 09/27/2020 Elsevier Patient Education  2022 [employer]

## 2021-11-24 NOTE — Progress Notes (Signed)
Right side of the face to the ear. Feeling the ear is blocked, and nasal congestion. Worried about snoring at night. ?

## 2021-11-24 NOTE — Progress Notes (Signed)
Patient ID: Candice [name], female   DOB: Oct 31, 1975, 46 y.o.   MRN: [id number] ? ? ?Candice [name], is a 46 y.o. female ? ?OEU:[id number] ? ?XVQ:[id number] ? ?DOB - 02-16-1976 ? ?Chief Complaint  ?Patient presents with  ? Ear Pain  ?  Both sides X 5 or 6 months  ?    ? ?Subjective:  ? ?Candice [name] is a 46 y.o. female here today for multiple issues ?Pain in the sides of head near her ears when she sleeps.  She also gets nasal congestion worse at night.  She is having a scratchy throat.  No fevers.  This has been going on 4-5 months.  Esp the R side/ear. ? ?Mageed interpreting ? ?She is also c/o snoring and sometimes stops breathing at night.  This has been going on for years ? ? ? ? ?No problems updated. ? ?ALLERGIES: ?No Known Allergies ? ?PAST MEDICAL HISTORY: ?Past Medical History:  ?Diagnosis Date  ? Irritable bowel syndrome (IBS)   ? Prediabetes   ? ? ?MEDICATIONS AT HOME: ?Prior to Admission medications   ?Medication Sig Start Date End Date Taking? Authorizing Provider  ?ALPRAZolam (XANAX) 0.25 MG tablet Take one tablet at the MRI and may repeat times one after 30 min. 11/17/21  Yes Jessy Oto, MD  ?estradiol (ESTRACE) 2 MG tablet Take 2 mg by mouth 2 (two) times daily. 06/11/21  Yes [provider]  ?fluticasone (FLONASE) 50 MCG/ACT nasal spray Place 2 sprays into both nostrils daily. 11/24/21  Yes Argentina Donovan, PA-C  ?ibuprofen (ADVIL) 600 MG tablet Take 1 tablet (600 mg total) by mouth every 6 (six) hours as needed. 08/12/21  Yes Isla Pence, MD  ?megestrol (MEGACE) 40 MG tablet Take 1 tablet (40 mg total) by mouth daily. 08/12/21  Yes Isla Pence, MD  ?Blood Glucose Monitoring Suppl [facility] VERIO REFLECT) w/Device KIT use daily as directed 11/24/21   Argentina Donovan, PA-C  ?gabapentin (NEURONTIN) 600 MG tablet Take 0.5 tablets (300 mg total) by mouth 3 (three) times daily. 05/25/21 08/23/21  Gildardo Pounds, NP  ?glucose blood [facility] VERIO) test strip Use as instructed 11/24/21    Argentina Donovan, PA-C  ?metFORMIN (GLUCOPHAGE) 500 MG tablet Take 1 tablet (500 mg total) by mouth daily with breakfast. 11/24/21   Argentina Donovan, PA-C  ? ? ?ROS: ?Neg resp ?Neg cardiac ?Neg GI ?Neg GU ?Neg MS ?Neg psych ?Neg neuro ? ?Objective:  ? ?Vitals:  ? 11/24/21 0958  ?BP: 130/85  ?Pulse: 97  ?Resp: 18  ?SpO2: 96%  ?Weight: 217 lb 6 oz (98.6 kg)  ?Height: 5' (1.524 m)  ? ?Exam ?General appearance : Awake, alert, not in any distress. Speech Clear. Not toxic looking; obeses ?HEENT: Atraumatic and Normocephalic B TM congestion.  No erythema.  Minimal cerumen on R; none on L.  Nasal turbinates boggy and throat with PND.   ?Neck: Supple, no JVD. No cervical lymphadenopathy.  ?Chest: Good air entry bilaterally, CTAB.  No rales/rhonchi/wheezing ?CVS: S1 S2 regular, no murmurs.  ?Extremities: B/L Lower Ext shows no edema, both legs are warm to touch ?Neurology: Awake alert, and oriented X 3, CN II-XII intact, Non focal ?Skin: No Rash ? ?Data Review ?Lab Results  ?Component Value Date  ? HGBA1C 6.3 (H) 09/30/2021  ? HGBA1C 6.6 (A) 03/16/2021  ? HGBA1C 6.1 (A) 08/18/2020  ? ? ?Assessment & Plan  ? ?1. Prediabetes ?- metFORMIN (GLUCOPHAGE) 500 MG tablet; Take 1 tablet (500 mg total) by mouth  daily with breakfast.  Dispense: 90 tablet; Refill: 1 ? ?2. Snoring ?- Split night study; Future ?- fluticasone (FLONASE) 50 MCG/ACT nasal spray; Place 2 sprays into both nostrils daily.  Dispense: 16 g; Refill: 6 ?- Ambulatory referral to ENT ? ?3. Otalgia of both ears ?- fluticasone (FLONASE) 50 MCG/ACT nasal spray; Place 2 sprays into both nostrils daily.  Dispense: 16 g; Refill: 6 ?- Ambulatory referral to ENT ? ?4. Language barrier ?[employer] "[employer]" interpreters used and additional time performing visit was required. ? ? ?5. Diabetes mellitus type 2 in obese [facility]) ?- Blood Glucose Monitoring Suppl (ONETOUCH VERIO REFLECT) w/Device KIT; use daily as directed  Dispense: 1 kit; Refill: 0 ?- glucose blood (ONETOUCH VERIO) test  strip; Use as instructed  Dispense: 100 each; Refill: 12 ? ?6. Post-nasal drip ?- fluticasone (FLONASE) 50 MCG/ACT nasal spray; Place 2 sprays into both nostrils daily.  Dispense: 16 g; Refill: 6 ?- Ambulatory referral to ENT ? ? ? ?Patient have been counseled extensively about nutrition and exercise. Other issues discussed during this visit include: low cholesterol diet, weight control and daily exercise, foot care, annual eye examinations at Ophthalmology, importance of adherence with medications and regular follow-up. We also discussed long term complications of uncontrolled diabetes and hypertension.  ? ?Return if symptoms worsen or fail to improve, for or 3 months with PCP;  sooner if needed. ? ?The patient was given clear instructions to go to ER or return to medical center if symptoms don't improve, worsen or new problems develop. The patient verbalized understanding. The patient was told to call to get lab results if they haven't heard anything in the next week.  ? ? ? ? ?Freeman Caldron, PA-C ?[place] ?[place], [place] ?[phone]   ?11/24/2021, 1:02 PM  ?

## 2021-11-26 ENCOUNTER — Ambulatory Visit (HOSPITAL_COMMUNITY)
Admission: RE | Admit: 2021-11-26 | Discharge: 2021-11-26 | Disposition: A | Discharge status: Home / Self Care | Payer: 59 | Source: Ambulatory Visit | Attending: Specialist | Admitting: Specialist

## 2021-11-26 DIAGNOSIS — M542 Cervicalgia: Secondary | ICD-10-CM | POA: Insufficient documentation

## 2021-11-27 NOTE — Procedures (Signed)
Lumbosacral Transforaminal Epidural Steroid Injection - Sub-Pedicular Approach with Fluoroscopic Guidance  Patient: Candice [name]      Date of Birth: [birth year] MRN: [id number] PCP: Claiborne Rigg, NP      Visit Date: 11/15/2021   Universal Protocol:    Date/Time: 11/15/2021  Consent Given By: the patient  Position: PRONE  Additional Comments: Vital signs were monitored before and after the procedure. Patient was prepped and draped in the usual sterile fashion. The correct patient, procedure, and site was verified.   Injection Procedure Details:   Procedure diagnoses: Lumbar radiculopathy [M54.16]    Meds Administered:  Meds ordered this encounter  Medications   methylPREDNISolone acetate (DEPO-MEDROL) injection 80 mg    Laterality: Left  Location/Site: L5  Needle:5.0 in., 22 ga.  Short bevel or Quincke spinal needle  Needle Placement: Transforaminal  Findings:    -Comments: Excellent flow of contrast along the nerve, nerve root and into the epidural space.  Procedure Details: After squaring off the end-plates to get a true AP view, the C-arm was positioned so that an oblique view of the foramen as noted above was visualized. The target area is just inferior to the "nose of the scotty dog" or sub pedicular. The soft tissues overlying this structure were infiltrated with 2-3 ml. of 1% Lidocaine without Epinephrine.  The spinal needle was inserted toward the target using a "trajectory" view along the fluoroscope beam.  Under AP and lateral visualization, the needle was advanced so it did not puncture dura and was located close the 6 O'Clock position of the pedical in AP tracterory. Biplanar projections were used to confirm position. Aspiration was confirmed to be negative for CSF and/or blood. A 1-2 ml. volume of Isovue-250 was injected and flow of contrast was noted at each level. Radiographs were obtained for documentation purposes.   After attaining the desired flow  of contrast documented above, a 0.5 to 1.0 ml test dose of 0.25% Marcaine was injected into each respective transforaminal space.  The patient was observed for 90 seconds post injection.  After no sensory deficits were reported, and normal lower extremity motor function was noted,   the above injectate was administered so that equal amounts of the injectate were placed at each foramen (level) into the transforaminal epidural space.   Additional Comments:  The patient tolerated the procedure well Dressing: 2 x 2 sterile gauze and Band-Aid    Post-procedure details: Patient was observed during the procedure. Post-procedure instructions were reviewed.  Patient left the clinic in stable condition.

## 2021-11-27 NOTE — Progress Notes (Signed)
Candice [name] - 46 y.o. female MRN [id number]  Date of birth: Feb 03, 1976  Office Visit Note: Visit Date: 11/15/2021 PCP: Claiborne Rigg, NP Referred by: Claiborne Rigg, NP  Subjective: Chief Complaint  Patient presents with   Lower Back - Pain   Left Leg - Pain   HPI:  Candice [name] is a 46 y.o. female who comes in today at the request of Zonia Kief, PA-C for planned Left L5-S1 Lumbar Transforaminal epidural steroid injection with fluoroscopic guidance.  The patient has failed conservative care including home exercise, medications, time and activity modification.  This injection will be diagnostic and hopefully therapeutic.  Please see requesting physician notes for further details and justification.  ROS Otherwise per HPI.  Assessment & Plan: Visit Diagnoses:    ICD-10-CM   1. Lumbar radiculopathy  M54.16 XR C-ARM NO REPORT    Epidural Steroid injection    methylPREDNISolone acetate (DEPO-MEDROL) injection 80 mg      Plan: No additional findings.   Meds & Orders:  Meds ordered this encounter  Medications   methylPREDNISolone acetate (DEPO-MEDROL) injection 80 mg    Orders Placed This Encounter  Procedures   XR C-ARM NO REPORT   Epidural Steroid injection    Follow-up: Return for visit to requesting provider as needed.   Procedures: No procedures performed  Lumbosacral Transforaminal Epidural Steroid Injection - Sub-Pedicular Approach with Fluoroscopic Guidance  Patient: Candice [name]      Date of Birth: May 10, 1976 MRN: [id number] PCP: Claiborne Rigg, NP      Visit Date: 11/15/2021   Universal Protocol:    Date/Time: 11/15/2021  Consent Given By: the patient  Position: PRONE  Additional Comments: Vital signs were monitored before and after the procedure. Patient was prepped and draped in the usual sterile fashion. The correct patient, procedure, and site was verified.   Injection Procedure Details:   Procedure diagnoses: Lumbar radiculopathy  [M54.16]    Meds Administered:  Meds ordered this encounter  Medications   methylPREDNISolone acetate (DEPO-MEDROL) injection 80 mg    Laterality: Left  Location/Site: L5  Needle:5.0 in., 22 ga.  Short bevel or Quincke spinal needle  Needle Placement: Transforaminal  Findings:    -Comments: Excellent flow of contrast along the nerve, nerve root and into the epidural space.  Procedure Details: After squaring off the end-plates to get a true AP view, the C-arm was positioned so that an oblique view of the foramen as noted above was visualized. The target area is just inferior to the "nose of the scotty dog" or sub pedicular. The soft tissues overlying this structure were infiltrated with 2-3 ml. of 1% Lidocaine without Epinephrine.  The spinal needle was inserted toward the target using a "trajectory" view along the fluoroscope beam.  Under AP and lateral visualization, the needle was advanced so it did not puncture dura and was located close the 6 O'Clock position of the pedical in AP tracterory. Biplanar projections were used to confirm position. Aspiration was confirmed to be negative for CSF and/or blood. A 1-2 ml. volume of Isovue-250 was injected and flow of contrast was noted at each level. Radiographs were obtained for documentation purposes.   After attaining the desired flow of contrast documented above, a 0.5 to 1.0 ml test dose of 0.25% Marcaine was injected into each respective transforaminal space.  The patient was observed for 90 seconds post injection.  After no sensory deficits were reported, and normal lower extremity motor function was noted,   the  above injectate was administered so that equal amounts of the injectate were placed at each foramen (level) into the transforaminal epidural space.   Additional Comments:  The patient tolerated the procedure well Dressing: 2 x 2 sterile gauze and Band-Aid    Post-procedure details: Patient was observed during the  procedure. Post-procedure instructions were reviewed.  Patient left the clinic in stable condition.    Clinical History: MRI LUMBAR SPINE WITHOUT CONTRAST   TECHNIQUE: Multiplanar, multisequence MR imaging of the lumbar spine was performed. No intravenous contrast was administered.   COMPARISON:  Radiography 06/02/2021.  MRI 02/16/2015.   FINDINGS: Segmentation:  5 lumbar type vertebral bodies.   Alignment:  Minimal scoliotic curvature convex to the right.   Vertebrae:  No fracture or focal lesion.   Conus medullaris and cauda equina: Conus extends to the T12-L1 level. Conus and cauda equina appear normal.   Paraspinal and other soft tissues: Negative   Disc levels:   No abnormality at L2-3 or above.   L3-4: Mild bulging of the disc. No compressive stenosis. No change since 27-Jan-2015.   L4-5: Disc degeneration with loss of disc height. Chronic shallow disc protrusion with slight caudal down turning. Slight indentation of the thecal sac and mild stenosis of the lateral recesses but without definite neural compression. No progressive change since 27-Jan-2015. The disc material has desiccated slightly.   L5-S1: Disc degeneration with bulging of the disc and a shallow protrusion towards the left. Mild facet and ligamentous hypertrophy. Stenosis of the subarticular lateral recess on the left that could possibly affect the left S1 nerve. The left subarticular lateral recess stenosis may be slightly more pronounced than was seen previously. The disc material has desiccated slightly.   IMPRESSION: L3-4: Minor, non-compressive disc bulge, unchanged.   L4-5: Shallow disc protrusion with slight caudal down turning. Slight indentation of the thecal sac and mild stenosis of both lateral recesses but no definite neural compression. No worsening since Jan 27, 2015. The disc material has desiccated slightly.   L5-S1: Shallow disc protrusion slightly more prominent towards the left. Stenosis of  the subarticular lateral recess on the left that could possibly affect the left S1 nerve. The subarticular lateral recess narrowing may be slightly more pronounced than was seen in 01-27-2015. The disc material at this level has also desiccated slightly.     Electronically Signed   By: Paulina Fusi M.D.   On: 06/07/2021 08:06     Objective:  VS:  HT:     WT:    BMI:      BP:(!) 146/79   HR:(!) 106bpm   TEMP: ( )   RESP:  Physical Exam Vitals and nursing note reviewed.  Constitutional:      General: She is not in acute distress.    Appearance: Normal appearance. She is not ill-appearing.  HENT:     Head: Normocephalic and atraumatic.     Right Ear: External ear normal.     Left Ear: External ear normal.  Eyes:     Extraocular Movements: Extraocular movements intact.  Cardiovascular:     Rate and Rhythm: Normal rate.     Pulses: Normal pulses.  Pulmonary:     Effort: Pulmonary effort is normal. No respiratory distress.  Abdominal:     General: There is no distension.     Palpations: Abdomen is soft.  Musculoskeletal:        General: Tenderness present.     Cervical back: Neck supple.     Right lower  leg: No edema.     Left lower leg: No edema.     Comments: Patient has good distal strength with no pain over the greater trochanters.  No clonus or focal weakness.  Skin:    Findings: No erythema, lesion or rash.  Neurological:     General: No focal deficit present.     Mental Status: She is alert and oriented to person, place, and time.     Sensory: No sensory deficit.     Motor: No weakness or abnormal muscle tone.     Coordination: Coordination normal.  Psychiatric:        Mood and Affect: Mood normal.        Behavior: Behavior normal.     Imaging: No results found.

## 2021-11-28 ENCOUNTER — Encounter: Payer: 59 | Admitting: Physical Therapy

## 2021-11-28 ENCOUNTER — Ambulatory Visit: Payer: 59 | Admitting: Nurse Practitioner

## 2021-11-28 ENCOUNTER — Telehealth: Payer: Self-pay | Admitting: Physical Therapy

## 2021-11-28 NOTE — Telephone Encounter (Signed)
LVM via interpreter about missed PT appt today.  Advised to call office if she needed anything.  Pt scheduled next week for PT.  If she does not show for this appt will need to consider canceling remaining scheduled. ?

## 2021-11-28 NOTE — Therapy (Incomplete)
?OUTPATIENT PHYSICAL THERAPY TREATMENT NOTE ? ? ?Patient Name: Candice [name] ?MRN: [id number] ?DOB:[birth year], 46 y.o., female ?Today's Date: 11/28/2021 ? ?PCP: Claiborne Rigg, NP ?REFERRING PROVIDER: Kerrin Champagne, MD ? ? ? ? ? ?Past Medical History:  ?Diagnosis Date  ? Irritable bowel syndrome (IBS)   ? Prediabetes   ? ?Past Surgical History:  ?Procedure Laterality Date  ? CIRCUMCISION  as a young girl   ? Type III femake circumcision   ? DILATION AND CURETTAGE OF UTERUS  2006  ? HEMORRHOID SURGERY  2015  ? HEMORRHOID SURGERY    ? in [place]  ? ?Patient Active Problem List  ? Diagnosis Date Noted  ? Leukocytosis 06/20/2021  ? Prediabetes 04/23/2018  ? History of gastroesophageal reflux (GERD) 08/03/2017  ? History of vitamin D deficiency 08/03/2017  ? History of Helicobacter pylori infection 08/03/2017  ? History of urinary tract infection 08/03/2017  ? Asymptomatic microscopic hematuria 08/03/2017  ? Chronic abdominal pain 08/03/2017  ? Left lower quadrant pain 06/05/2016  ? Hemorrhoid 04/06/2016  ? Neck pain 10/28/2015  ? Shoulder pain, bilateral 10/28/2015  ? Vertigo 07/29/2015  ? Tachycardia 07/08/2015  ? Vitamin D deficiency 04/08/2015  ? Lumbar back pain with radiculopathy affecting left lower extremity 04/07/2015  ? 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  ? Chronic pain of multiple joints 01/29/2015  ? Irregular menses 10/01/2014  ? Allergic rhinitis 10/01/2014  ? Chalazion of left upper eyelid 10/01/2014  ? 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 ? ?THERAPY DIAG:  ?No diagnosis found. ? ?   ?REFERRING PROVIDER: Naida Sleight, PA-C ?  ?REFERRING DIAG:  ?M54.2 (ICD-10-CM) - Neck pain ?M54.16 (ICD-10-CM) - Lumbar back pain with radiculopathy affecting left lower extremity ?  ?ONSET DATE: 5 years ago ?  ?SUBJECTIVE:                                                                                                                                                                                                         ?  ?SUBJECTIVE STATEMENT: ?*** Reports pain is about the same, doing exercises ? ?PERTINENT HISTORY:  ? DJD, prediabetes, IBS, Tachycardia,  ?  ?PAIN:  ?Are you having pain? Yes ?NPRS scale: ***/10 (at rest), 10/10 with activity ?Pain location: shoulders and upper back ?Pain orientation: Bilateral  ?PAIN TYPE: achy, tight, numbness and tingling ?Pain description: constant  ?Aggravating factors: Laying down on all sides, Walking ?Relieving factors: over- the- counter medication (does not help all the time), rest ?  ?PRECAUTIONS: None ?  ?WEIGHT BEARING RESTRICTIONS No ?  ?  PLOF: Independent ?  ?PATIENT GOALS: Decrease pain ?  ?OBJECTIVE:  ?11/01/21 ?Lumbar ROM: 11/01/2021 ? ?AROM AROM (deg) ?11/01/2021  ?Flexion Full ROM w/ concordant pain   ?Extension Full ROM  ?Right lateral flexion Full ROM w/ concordant pain  ?Left lateral flexion Full ROM w/ concordant pain  ?Right rotation Full ROM   ?Left rotation Full ROM  ? ?Shoulder: 11/01/2021- All shoulder ROM full and pain free  ?MMT R L  ?Shoulder Flexion  4-/5 4-/5  ?Shoulder Abduction 3+/5 3+/5  ? ? ?  ? ?  ?CERVICAL AROM/PROM ?  ?APROM A/PROM (deg) ?10/20/2021  ?Flexion 20 deg.   ?Extension 25 deg.  ?Right lateral flexion 10 deg.  ?Left lateral flexion 10 deg.  ?Right rotation 25% of full ROM   ?Left rotation 25% of full ROM  ? (Blank rows = not tested) ?  ?  ?Cervical MMT: measurements taken using hand- held dynamometer  ?  ?MMT #'s of force ?10/20/2021  ?Cervical Flexion  7.0  ?Cervical Extension 8.5  ?R side Bend 7.0  ?L Side Bend 7.5  ? (Blank rows = not tested) ? ?  ?DIAGNOSTIC FINDINGS:  ?"MRI: disc degeneration and small disc protrusion at L4-5 and L5-S1" ?  ?PATIENT SURVEYS:  ?FOTO 39% ? ? ? ?  ?  ?TODAY'S TREATMENT  ?11/28/21 ?*** ? ?11/21/21 ?Therex: ? NuStep L5 x 8 min ?  ?SKTC 3x30 sec bil  ? Supine cervical retraction 10 x 10 sec hold ? Overhead pull with L3 band 2x10 ? Supine isometric cervical sidebending 10 x 5 sec hold bil ?  ?Seated cervical  extension with towel 10 x 10 sec hold ? Seated Cervical rotation AAROM bilat. 10 second hold x 10 ? Upper trap stretch 3x20 sec hold bil ? Seated scapular retraction 10 x 5 sec hold ? Seated bil ER with L3 band x 10 reps ?  ?Standing rows L3 band x 10 reps ?Standing extension L3 band x 10 reps ?  ?  ? ?11/01/21:  ?Therapeutic Exercise: ? Aerobic: UBE both UE & LE, Seat 6 Lvl 2, 6 minutes ?Supine: ?Prone: ? Seated: Green ball rolling out into lumbar flexion (middle, left, and right); Cervical ext. SNAG with stretch strap 5 sec. Hold x 10; Cervical rotation AAROM bilat. 5 second hold x 10, Cervical Flexion w/ heat pack around neck 5 sec. hold x 10 ? Standing: Green theraband shoulder rows 2 x 10 bilat. Red theraband shoulder extension 2 x 10 bilat. Green theraband shoulder horizontal abduction 2 x 10 ?Neuromuscular Re-education: ?Manual Therapy: ?Therapeutic Activity: ?Self Care: ?Trigger Point Dry Needling:  ?Modalities: Heat pack around neck with cervical flexion exercise 8 min ? ?  ?  ?PATIENT EDUCATION:  ?Education details: HEP, how to sleep to increase neck support, recommended sports bras to help decrease neck pain. ?Person educated: Patient via interpreter ?Education method: Explanation, Demonstration, Verbal cues, and Handouts ?Education comprehension: verbalized understanding, returned demonstration, verbal cues required, and needs further education ?  ?  ?HOME EXERCISE PROGRAM: ?Access Code: [email or web address] ?URL: [email or web address] ?Date: 10/20/2021 ?Prepared by: Ivery Quale ?  ?Exercises ?Seated Passive Cervical Retraction - 2 x daily - 6 x weekly - 1-2 sets - 10 reps - 5 sec hold ?Cervical Extension AROM with Strap - 2 x daily - 6 x weekly - 1-2 sets - 10 reps - 10 sec hold ?Seated Assisted Cervical Rotation with Towel - 2 x daily - 6 x weekly - 1 sets - 10 reps - 5 hold ?Seated  Upper Trapezius Stretch - 2 x daily - 6 x weekly - 1 sets - 2-3 reps - 30 sec hold ?Seated Scapular Retraction - 2 x  daily - 6 x weekly - 1 sets - 10 reps - 3 sec hold ?  ?  ?ASSESSMENT: ?  ?CLINICAL IMPRESSION: *** Pt tolerated session well today without increase in pain or symptoms.  Session focused  on postural exercises.  She c/o of some Lt wrist pain which seems to be coming from her wrist flexors.  Will continue to benefit from PT to maximize function. ?  ?REHAB POTENTIAL: Good ?  ?CLINICAL DECISION MAKING: stable/uncomplicated ?EVALUATION COMPLEXITY: Low ?GOALS: ?Short term PT Goals (target date for Short term goals are 4 weeks 11/17/21) ?Pt will be I and compliant with HEP. ?Baseline:  ?Goal status: New ?Pt will decrease pain by 25% overall ?Baseline: ?Goal status: New ?  ?Long term PT goals (target dates for all long term goals are 6 weeks 12/01/21) ?[facility] to improve functional mobility ?Baseline: ?Goal status: New ?Pt will improve  cervical strength  MMT by 50% or to 15# to improve functional strength ?Baseline: ?Goal status: New ?Pt will improve FOTO to at least 50% functional to show improved function ?Baseline: 39% currently ?Goal status: New ?Pt will reduce pain by overall 50% overall with usual activity ?Baseline: ?Goal status: New ?  ?PLAN: ?PT FREQUENCY: 1-2 times per week  ?  ?PT DURATION: 6 weeks ?  ?PLANNED INTERVENTIONS (unless contraindicated): aquatic PT, Canalith repositioning, cryotherapy, Electrical stimulation, Iontophoresis with 4 mg/ml dexamethasome, Moist heat, traction, Ultrasound, gait training, Therapeutic exercise, balance training, neuromuscular re-education, patient/family education, prosthetic training, manual techniques, passive ROM, dry needling, taping, vasopnuematic device, vestibular, spinal manipulations, joint manipulations ?  ?PLAN FOR NEXT SESSION: *** give new exercises if pt not too sore after today's session, monitor wrist pain, continue with postural exercises  ? ?Clarita CraneStephanie F Brannen Koppen, PT, DPT ?11/28/21 7:46 AM ? ? ? ?

## 2021-11-30 ENCOUNTER — Other Ambulatory Visit: Payer: Self-pay

## 2021-11-30 ENCOUNTER — Encounter: Payer: Self-pay | Admitting: Physical Therapy

## 2021-11-30 ENCOUNTER — Ambulatory Visit (INDEPENDENT_AMBULATORY_CARE_PROVIDER_SITE_OTHER): Payer: 59 | Admitting: Physical Therapy

## 2021-11-30 DIAGNOSIS — G8929 Other chronic pain: Secondary | ICD-10-CM

## 2021-11-30 DIAGNOSIS — M542 Cervicalgia: Secondary | ICD-10-CM

## 2021-11-30 DIAGNOSIS — M6281 Muscle weakness (generalized): Secondary | ICD-10-CM

## 2021-11-30 DIAGNOSIS — R262 Difficulty in walking, not elsewhere classified: Secondary | ICD-10-CM | POA: Diagnosis not present

## 2021-11-30 DIAGNOSIS — M5442 Lumbago with sciatica, left side: Secondary | ICD-10-CM

## 2021-11-30 DIAGNOSIS — M5441 Lumbago with sciatica, right side: Secondary | ICD-10-CM | POA: Diagnosis not present

## 2021-11-30 NOTE — Therapy (Signed)
?OUTPATIENT PHYSICAL THERAPY TREATMENT NOTE ? ? ?Patient Name: Candice [name] ?MRN: [id number] ?DOB:1976/02/17, 46 y.o., female ?Today's Date: 11/30/2021 ? ?PCP: Claiborne Rigg, NP ?REFERRING PROVIDER: Kerrin Champagne, MD ? ? PT End of Session - 11/30/21 1020   ? ? Visit Number 4   ? Number of Visits 6   ? Date for PT Re-Evaluation 12/01/21   ? PT Start Time 1014   ? PT Stop Time 1057   ? PT Time Calculation (min) 43 min   ? Activity Tolerance Patient limited by pain   ? Behavior During Therapy [facility] for tasks assessed/performed   ? ?  ?  ? ?  ? ? ? ? ?Past Medical History:  ?Diagnosis Date  ? Irritable bowel syndrome (IBS)   ? Prediabetes   ? ?Past Surgical History:  ?Procedure Laterality Date  ? CIRCUMCISION  as a young girl   ? Type III femake circumcision   ? DILATION AND CURETTAGE OF UTERUS  2006  ? HEMORRHOID SURGERY  2015  ? HEMORRHOID SURGERY    ? in [place]  ? ?Patient Active Problem List  ? Diagnosis Date Noted  ? Leukocytosis 06/20/2021  ? Prediabetes 04/23/2018  ? History of gastroesophageal reflux (GERD) 08/03/2017  ? History of vitamin D deficiency 08/03/2017  ? History of Helicobacter pylori infection 08/03/2017  ? History of urinary tract infection 08/03/2017  ? Asymptomatic microscopic hematuria 08/03/2017  ? Chronic abdominal pain 08/03/2017  ? Left lower quadrant pain 06/05/2016  ? Hemorrhoid 04/06/2016  ? Neck pain 10/28/2015  ? Shoulder pain, bilateral 10/28/2015  ? Vertigo 07/29/2015  ? Tachycardia 07/08/2015  ? Vitamin D deficiency 04/08/2015  ? Lumbar back pain with radiculopathy affecting left lower extremity 04/07/2015  ? 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  ? Chronic pain of multiple joints 01/29/2015  ? Irregular menses 10/01/2014  ? Allergic rhinitis 10/01/2014  ? Chalazion of left upper eyelid 10/01/2014  ? 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 ? ?THERAPY DIAG:  ?Cervicalgia ? ?Muscle weakness (generalized) ? ?Chronic midline low back pain with bilateral  sciatica ? ?Difficulty in walking, not elsewhere classified ? ?   ?REFERRING PROVIDER: Naida Sleight, PA-C ?  ?REFERRING DIAG:  ?M54.2 (ICD-10-CM) - Neck pain ?M54.16 (ICD-10-CM) - Lumbar back pain with radiculopathy affecting left lower extremity ?  ?ONSET DATE: 5 years ago ?  ?SUBJECTIVE:                                                                                                                                                                                                        ?  ?  SUBJECTIVE STATEMENT: ?"Not good."  Pain is worse yesterday and today  ? ?PERTINENT HISTORY:  ? DJD, prediabetes, IBS, Tachycardia,  ?  ?PAIN:  ?Are you having pain? Yes ?NPRS scale: 8/10 (at rest), 10/10 with activity ?Pain location: shoulders and upper back ?Pain orientation: Bilateral  ?PAIN TYPE: achy, tight, numbness and tingling ?Pain description: constant  ?Aggravating factors: Laying down on all sides, Walking ?Relieving factors: over- the- counter medication (does not help all the time), rest ?  ?PRECAUTIONS: None ?  ?WEIGHT BEARING RESTRICTIONS No ?  ?PLOF: Independent ?  ?PATIENT GOALS: Decrease pain ?  ?OBJECTIVE:  ?11/01/21 ?Lumbar ROM: 11/01/2021 ? ?AROM AROM (deg) ?11/01/2021  ?Flexion Full ROM w/ concordant pain   ?Extension Full ROM  ?Right lateral flexion Full ROM w/ concordant pain  ?Left lateral flexion Full ROM w/ concordant pain  ?Right rotation Full ROM   ?Left rotation Full ROM  ? ?Shoulder: 11/01/2021- All shoulder ROM full and pain free  ?MMT R L  ?Shoulder Flexion  4-/5 4-/5  ?Shoulder Abduction 3+/5 3+/5  ? ? ?  ? ?  ?CERVICAL AROM/PROM ?  ?APROM A/PROM  ?10/20/2021 AROM ?11/30/21  ?Flexion 20 deg.  18  ?Extension 25 deg. 25  ?Right lateral flexion 10 deg. 15  ?Left lateral flexion 10 deg. 13  ?Right rotation 25% of full ROM  28  ?Left rotation 25% of full ROM 29  ? (Blank rows = not tested) ?  ?  ?Cervical MMT: measurements taken using hand- held dynamometer  ?  ?MMT #'s of force ?10/20/2021 # of  force ?11/30/21  ?Cervical Flexion  7.0 8.7#  ?Cervical Extension 8.5 9.9#  ?R side Bend 7.0 11.4#  ?L Side Bend 7.5 13.3#  ? (Blank rows = not tested) ? ?  ?DIAGNOSTIC FINDINGS:  ?"MRI: disc degeneration and small disc protrusion at L4-5 and L5-S1" ?  ?PATIENT SURVEYS:  ?10/20/21 FOTO 39% ?11/30/21 FOTO 56% ? ? ? ?  ?  ?TODAY'S TREATMENT  ?11/30/21 ?Therex: ?    Aerobic ? NuStep L5 x 10 min ?    Reviewed exercises from prior session at pt's request and added to HEP, pt verbalized understanding ?Self-Care: ? Discussed current progress and POC; agree to d/c PT at this time and follow up with MD due to limited progress, pt hopeful for possible injection.  Can return to PT if indicated at later date. ? ?11/21/21 ?Therex: ?NuStep L5 x 8 min ?  ?SKTC 3x30 sec bil  ?Supine cervical retraction 10 x 10 sec hold ?Overhead pull with L3 band 2x10 ?Supine isometric cervical sidebending 10 x 5 sec hold bil ?  ?Seated cervical extension with towel 10 x 10 sec hold ?Seated Cervical rotation AAROM bilat. 10 second hold x 10 ?Upper trap stretch 3x20 sec hold bil ?Seated scapular retraction 10 x 5 sec hold ?Seated bil ER with L3 band x 10 reps ?  ?Standing rows L3 band x 10 reps ?Standing extension L3 band x 10 reps ?  ?  ? ?11/01/21:  ?Therapeutic Exercise: ? Aerobic: UBE both UE & LE, Seat 6 Lvl 2, 6 minutes ?Supine: ?Prone: ? Seated: Green ball rolling out into lumbar flexion (middle, left, and right); Cervical ext. SNAG with stretch strap 5 sec. Hold x 10; Cervical rotation AAROM bilat. 5 second hold x 10, Cervical Flexion w/ heat pack around neck 5 sec. hold x 10 ? Standing: Green theraband shoulder rows 2 x 10 bilat. Red theraband shoulder extension 2 x 10 bilat. [place] Si[employer]  theraband shoulder horizontal abduction 2 x 10 ?Neuromuscular Re-education: ?Manual Therapy: ?Therapeutic Activity: ?Self Care: ?Trigger Point Dry Needling:  ?Modalities: Heat pack around neck with cervical flexion exercise 8 min ? ?  ?  ?PATIENT EDUCATION:  ?Education  details: HEP, how to sleep to increase neck support, recommended sports bras to help decrease neck pain. ?Person educated: Patient via interpreter ?Education method: Explanation, Demonstration, Verbal cues, and Handouts ?Education comprehension: verbalized understanding, returned demonstration, verbal cues required, and needs further education ?  ?  ?HOME EXERCISE PROGRAM: ?Access Code: [email or web address] ?URL: [email or web address] ?Date: 11/30/2021 ?Prepared by: Moshe Cipro ? ?Exercises ?Supine Posterior Pelvic Tilt - 2 x daily - 7 x weekly - 2 sets - 10 reps - 5 seconds hold ?Supine March - 2 x daily - 7 x weekly - 2 sets - 10 reps ?Supine Lower Trunk Rotation - 2 x daily - 7 x weekly - 3 reps - 20 seocnds hold ?Squat with Chair Touch - 1 x daily - 7 x weekly - 3 sets - 10 reps ?Standing Lumbar Extension at Wall - Forearms - 4-5 x daily - 7 x weekly - 2 sets - 10 reps ?Seated Table Piriformis Stretch (Mirrored) - 2 x daily - 7 x weekly - 1 sets - 3 reps - 30-60 sec hold ?Mini Squat with Counter Support - 2 x daily - 7 x weekly - 2 sets - 10 reps ?Supine Figure 4 Piriformis Stretch - 2 x daily - 7 x weekly - 10 reps - 5-10 seconds hold ?Hooklying Single Knee to Chest - 2 x daily - 7 x weekly - 3 reps - 1 sets - 30 sec hold ?Supine Chin Tuck - 2 x daily - 7 x weekly - 10 reps - 1 sets - 5 sec hold ?Supine Shoulder Flexion with Dowel - 2 x daily - 7 x weekly - 10 reps - 1 sets - 3-5 sec hold ?Supine Isometric Neck Sidebend - 2 x daily - 7 x weekly - 1 sets - 10 reps - 5 sec hold ?Mid-Lower Cervical Extension SNAG with Strap - 2 x daily - 7 x weekly - 1 sets - 10 reps - 10 sec hold ?Seated Assisted Cervical Rotation with Towel - 2 x daily - 7 x weekly - 1 sets - 10 reps - 10 sec hold ?Seated Upper Trapezius Stretch - 2 x daily - 7 x weekly - 3 reps - 1 sets - 30 sec hold ?Seated Scapular Retraction - 2 x daily - 7 x weekly - 10 reps - 1 sets - 5 sec hold ?Shoulder External Rotation with Resistance - 2 x  daily - 7 x weekly - 1 sets - 10 reps - 2 sec hold ?Standing Row with Anchored Resistance - 2 x daily - 7 x weekly - 2 sets - 10 reps - 5 sec hold ?Single Arm Shoulder Extension with Anchored Resistance - 2 x daily - 7 x weekly - 2 s

## 2021-12-05 ENCOUNTER — Encounter: Payer: 59 | Admitting: Physical Therapy

## 2021-12-12 ENCOUNTER — Encounter: Payer: 59 | Admitting: Physical Therapy

## 2021-12-14 ENCOUNTER — Encounter: Payer: Self-pay | Admitting: Specialist

## 2021-12-14 ENCOUNTER — Telehealth: Payer: Self-pay | Admitting: Specialist

## 2021-12-14 ENCOUNTER — Ambulatory Visit (INDEPENDENT_AMBULATORY_CARE_PROVIDER_SITE_OTHER): Payer: 59 | Admitting: Specialist

## 2021-12-14 ENCOUNTER — Other Ambulatory Visit: Payer: Self-pay

## 2021-12-14 VITALS — BP 147/87 | HR 89 | Ht 60.0 in | Wt 217.5 lb

## 2021-12-14 DIAGNOSIS — M5116 Intervertebral disc disorders with radiculopathy, lumbar region: Secondary | ICD-10-CM | POA: Diagnosis not present

## 2021-12-14 DIAGNOSIS — R29898 Other symptoms and signs involving the musculoskeletal system: Secondary | ICD-10-CM

## 2021-12-14 DIAGNOSIS — M797 Fibromyalgia: Secondary | ICD-10-CM | POA: Diagnosis not present

## 2021-12-14 DIAGNOSIS — M5416 Radiculopathy, lumbar region: Secondary | ICD-10-CM

## 2021-12-14 DIAGNOSIS — M542 Cervicalgia: Secondary | ICD-10-CM

## 2021-12-14 MED ORDER — DULOXETINE HCL 30 MG PO CPEP
30.0000 mg | ORAL_CAPSULE | Freq: Every day | ORAL | 3 refills | Status: DC
  Filled 2021-12-14: qty 30, 30d supply, fill #0

## 2021-12-14 MED ORDER — CYCLOBENZAPRINE HCL 5 MG PO TABS
5.0000 mg | ORAL_TABLET | Freq: Three times a day (TID) | ORAL | 3 refills | Status: DC | PRN
  Filled 2021-12-14: qty 90, 30d supply, fill #0

## 2021-12-14 NOTE — Telephone Encounter (Signed)
Pt states that she needs her medication sent to the [facility] on [address] ? ?CB [phone]  ?

## 2021-12-14 NOTE — Patient Instructions (Signed)
Plan: Avoid frequent bending and stooping  ?No lifting greater than 10 lbs. ?May use ice or moist heat for pain. ?Weight loss is of benefit. ?Best medication for lumbar disc disease is arthritis medications like motrin, celebrex and naprosyn. ?Exercise is important to improve your indurance and does allow people to function better inspite of back pain. ?Flexeril 5 mg po up to TID prn pain ?Alternate therapy, accupuncture and chiropractic treatments my be of benefits. ?The MRI of the neck does not show abnormality that is able to be treated with surgery or injection of the  ?Joints in the neck. ?Advil for joint and musclar pain. ?

## 2021-12-14 NOTE — Progress Notes (Signed)
? ?Office Visit Note ?  ?Patient: Candice [name]           ?Date of Birth: 08-31-1976           ?MRN: [id number] ?Visit Date: 12/14/2021 ?             ?Requested by: Gildardo Pounds, NP ?[address] ?[address] ?[place],  [place] [id number] ?PCP: Gildardo Pounds, NP ? ? ?Assessment & Plan: ?Visit Diagnoses:  ?1. Fibromyalgia   ?2. Lumbar radiculopathy   ?3. Weakness of right leg   ?4. Lumbar back pain with radiculopathy affecting left lower extremity   ?5. Herniation of lumbar intervertebral disc with radiculopathy   ?6. Cervicalgia   ? ? ?Plan: Avoid frequent bending and stooping  ?No lifting greater than 10 lbs. ?May use ice or moist heat for pain. ?Weight loss is of benefit. ?Best medication for lumbar disc disease is arthritis medications like motrin, celebrex and naprosyn. ?Exercise is important to improve your indurance and does allow people to function better inspite of back pain. ?Flexeril 5 mg po up to TID prn pain ?Alternate therapy, accupuncture and chiropractic treatments my be of benefits. ?The MRI of the neck does not show abnormality that is able to be treated with surgery or injection of the  ?Joints in the neck. ?  ? ?Follow-Up Instructions: No follow-ups on file.  ? ?Orders:  ?No orders of the defined types were placed in this encounter. ? ?No orders of the defined types were placed in this encounter. ? ? ? ? Procedures: ?No procedures performed ? ? ?Clinical Data: ?No additional findings. ? ? ?Subjective: ?Chief Complaint  ?Patient presents with  ? Neck - Follow-up  ? ? ?46 year old female with history of neck and shoulder pain, she has had physical therapy without benefit and  ?Is seen today with persisting pain in the neck. She is concerned about her discs and  ? ? ?Review of Systems  ?Constitutional: Negative.   ?HENT: Negative.    ?Eyes: Negative.   ?Respiratory: Negative.    ?Cardiovascular: Negative.   ?Gastrointestinal: Negative.   ?Endocrine: Negative.   ?Genitourinary: Negative.    ?Musculoskeletal: Negative.   ?Skin: Negative.   ?Allergic/Immunologic: Negative.   ?Neurological: Negative.   ?Hematological: Negative.   ?Psychiatric/Behavioral: Negative.    ? ? ?Objective: ?Vital Signs: BP (!) 147/87 (BP Location: Left Arm, Patient Position: Sitting)   Pulse 89   Ht 5' (1.524 m)   Wt 217 lb 8 oz (98.7 kg)   BMI 42.48 kg/m?  ? ?Physical Exam ?Constitutional:   ?   Appearance: She is well-developed.  ?HENT:  ?   Head: Normocephalic and atraumatic.  ?Eyes:  ?   Pupils: Pupils are equal, round, and reactive to light.  ?Pulmonary:  ?   Effort: Pulmonary effort is normal.  ?   Breath sounds: Normal breath sounds.  ?Abdominal:  ?   General: Bowel sounds are normal.  ?   Palpations: Abdomen is soft.  ?Musculoskeletal:     ?   General: Normal range of motion.  ?   Cervical back: Normal range of motion and neck supple.  ?Skin: ?   General: Skin is warm and dry.  ?Neurological:  ?   Mental Status: She is alert and oriented to person, place, and time.  ?Psychiatric:     ?   Behavior: Behavior normal.     ?   Thought Content: Thought content normal.     ?  Judgment: Judgment normal.  ? ? ?Ortho Exam ? ?Specialty Comments:  ?MRI LUMBAR SPINE WITHOUT CONTRAST ?  ?TECHNIQUE: ?Multiplanar, multisequence MR imaging of the lumbar spine was ?performed. No intravenous contrast was administered. ?  ?COMPARISON:  Radiography 06/02/2021.  MRI 02/16/2015. ?  ?FINDINGS: ?Segmentation:  5 lumbar type vertebral bodies. ?  ?Alignment:  Minimal scoliotic curvature convex to the right. ?  ?Vertebrae:  No fracture or focal lesion. ?  ?Conus medullaris and cauda equina: Conus extends to the T12-L1 ?level. Conus and cauda equina appear normal. ?  ?Paraspinal and other soft tissues: Negative ?  ?Disc levels: ?  ?No abnormality at L2-3 or above. ?  ?L3-4: Mild bulging of the disc. No compressive stenosis. No change ?since 02-Jan-2015. ?  ?L4-5: Disc degeneration with loss of disc height. Chronic shallow ?disc protrusion with slight  caudal down turning. Slight indentation ?of the thecal sac and mild stenosis of the lateral recesses but ?without definite neural compression. No progressive change since ?01-02-15. The disc material has desiccated slightly. ?  ?L5-S1: Disc degeneration with bulging of the disc and a shallow ?protrusion towards the left. Mild facet and ligamentous hypertrophy. ?Stenosis of the subarticular lateral recess on the left that could ?possibly affect the left S1 nerve. The left subarticular lateral ?recess stenosis may be slightly more pronounced than was seen ?previously. The disc material has desiccated slightly. ?  ?IMPRESSION: ?L3-4: Minor, non-compressive disc bulge, unchanged. ?  ?L4-5: Shallow disc protrusion with slight caudal down turning. ?Slight indentation of the thecal sac and mild stenosis of both ?lateral recesses but no definite neural compression. No worsening ?since January 02, 2015. The disc material has desiccated slightly. ?  ?L5-S1: Shallow disc protrusion slightly more prominent towards the ?left. Stenosis of the subarticular lateral recess on the left that ?could possibly affect the left S1 nerve. The subarticular lateral ?recess narrowing may be slightly more pronounced than was seen in ?2015-01-02. The disc material at this level has also desiccated slightly. ?  ?  ?Electronically Signed ?  By: Nelson Chimes M.D. ?  On: 06/07/2021 08:06 ? ?Imaging: ?No results found. ? ? ?PMFS History: ?Patient Active Problem List  ? Diagnosis Date Noted  ? Leukocytosis 06/20/2021  ? Prediabetes 04/23/2018  ? History of gastroesophageal reflux (GERD) 08/03/2017  ? History of vitamin D deficiency 08/03/2017  ? History of Helicobacter pylori infection 08/03/2017  ? History of urinary tract infection 08/03/2017  ? Asymptomatic microscopic hematuria 08/03/2017  ? Chronic abdominal pain 08/03/2017  ? Left lower quadrant pain 06/05/2016  ? Hemorrhoid 04/06/2016  ? Neck pain 10/28/2015  ? Shoulder pain, bilateral 10/28/2015  ? Vertigo  07/29/2015  ? Tachycardia 07/08/2015  ? Vitamin D deficiency 04/08/2015  ? Lumbar back pain with radiculopathy affecting left lower extremity 04/07/2015  ? 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  ? Chronic pain of multiple joints 01/29/2015  ? Irregular menses 10/01/2014  ? Allergic rhinitis 10/01/2014  ? Chalazion of left upper eyelid 10/01/2014  ? 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 ?Past Medical History:  ?Diagnosis Date  ? Irritable bowel syndrome (IBS)   ? Prediabetes   ?  ?Family History  ?Problem Relation Age of Onset  ? Hypertension Father   ?  ?Past Surgical History:  ?Procedure Laterality Date  ? CIRCUMCISION  as a young girl   ? Type III femake circumcision   ? DILATION AND CURETTAGE OF UTERUS  January 01, 2005  ? HEMORRHOID SURGERY  January 01, 2014  ? HEMORRHOID SURGERY    ?  in [place]  ? ?Social History  ? ?Occupational History  ? Not on file  ?Tobacco Use  ? Smoking status: Never  ? Smokeless tobacco: Never  ?Vaping Use  ? Vaping Use: Never used  ?Substance and Sexual Activity  ? Alcohol use: No  ? Drug use: No  ? Sexual activity: Yes  ? ? ? ? ? ? ?

## 2021-12-16 ENCOUNTER — Other Ambulatory Visit: Payer: Self-pay | Admitting: Specialist

## 2021-12-16 MED ORDER — CYCLOBENZAPRINE HCL 5 MG PO TABS: 5.0000 mg | ORAL_TABLET | Freq: Three times a day (TID) | ORAL | 3 refills | Status: AC | PRN

## 2021-12-16 MED ORDER — DULOXETINE HCL 30 MG PO CPEP: 30.0000 mg | ORAL_CAPSULE | Freq: Every day | ORAL | 3 refills | Status: DC

## 2021-12-19 NOTE — Telephone Encounter (Signed)
Done 12/16/21 ?

## 2021-12-20 ENCOUNTER — Other Ambulatory Visit: Payer: Self-pay

## 2021-12-21 ENCOUNTER — Other Ambulatory Visit: Payer: Self-pay

## 2021-12-28 ENCOUNTER — Encounter: Payer: Self-pay | Admitting: Physical Medicine & Rehabilitation

## 2022-01-17 ENCOUNTER — Encounter: Payer: Self-pay | Admitting: Physical Medicine & Rehabilitation

## 2022-01-17 ENCOUNTER — Encounter: Payer: 59 | Attending: Physical Medicine & Rehabilitation | Admitting: Physical Medicine & Rehabilitation

## 2022-01-17 VITALS — BP 111/69 | HR 106 | Ht 60.0 in | Wt 220.4 lb

## 2022-01-17 DIAGNOSIS — M797 Fibromyalgia: Secondary | ICD-10-CM | POA: Insufficient documentation

## 2022-01-17 MED ORDER — CYCLOBENZAPRINE HCL 5 MG PO TABS: 5.0000 mg | ORAL_TABLET | Freq: Three times a day (TID) | ORAL | 1 refills | Status: DC | PRN

## 2022-01-17 MED ORDER — DULOXETINE HCL 30 MG PO CPEP: 60.0000 mg | ORAL_CAPSULE | Freq: Every day | ORAL | 3 refills | Status: DC

## 2022-01-17 NOTE — Patient Instructions (Signed)
Duloxetine dose increased to 60mg  daily ?aerobic activity ? ?

## 2022-01-17 NOTE — Progress Notes (Signed)
? ?Subjective:  ? ? Patient ID: Candice [name], female    DOB: 07-Aug-1976, 46 y.o.   MRN: [id number] ? ?HPI ?Candice [name] is a 46 yo female with PMH of lumbar back pain with radiculopathy here for neck and shoulder pain.  She reports neck and back pain since 2015. Pain is tingling, stabbing and shooting. She also has pain in her legs worse on the left. Pain radiates to all her fingers and toes.  Bending worsens the pain.  Inactivity and sitting also worsens the pain.  She does not exercise and is not very active a this time.  She had PT with minimal benefit. She reports general fatigue and sleep disturbance,often hard to fall asleep. Prior ESI x2 reports that helped her leg pain.  She uses tylenol in the past with minimal benefit and stopped due to concerns it would damage her liver if used chronically.  Flexeril and duloxetine help improve her pain.  No bowel or bladder changes. ? ? ?Pain Inventory ?Average Pain 10 ?Pain Right Now 8 ?My pain is stabbing ? ?In the last 24 hours, has pain interfered with the following? ?General activity 10 ?Relation with others 9 ?Enjoyment of life 10 ?What TIME of day is your pain at its worst? morning , daytime, evening, and night ?Sleep (in general) Poor ? ?Pain is worse with: walking, bending, sitting, inactivity, standing, and some activites ?Pain improves with: medication and injections ?Relief from Meds: 5 ? ?walk without assistance ?ability to climb steps?  yes ?do you drive?  yes ? ?employed # of hrs/week 16 ?what is your job? Works at [employer] (sitting ) ? ?numbness ?tingling ?spasms ? ?Any changes since last visit?  no  Has translator in room but understands English. Sister in room is a physician visiting from [place] (OB/GYN)  ? ?Orthopedist Candice Browns MD ? ? ? ?Family History  ?Problem Relation Age of Onset  ? Hypertension Father   ? ?Social History  ? ?Socioeconomic History  ? Marital status: Single  ?  Spouse name: Not on file  ? Number of children: Not on  file  ? Years of education: Not on file  ? Highest education level: Not on file  ?Occupational History  ? Not on file  ?Tobacco Use  ? Smoking status: Never  ? Smokeless tobacco: Never  ?Vaping Use  ? Vaping Use: Never used  ?Substance and Sexual Activity  ? Alcohol use: No  ? Drug use: No  ? Sexual activity: Yes  ?Other Topics Concern  ? Not on file  ?Social History Narrative  ? Not on file  ? ?Social Determinants of Health  ? ?Financial Resource Strain: Not on file  ?Food Insecurity: Not on file  ?Transportation Needs: Not on file  ?Physical Activity: Not on file  ?Stress: Not on file  ?Social Connections: Not on file  ? ?Past Surgical History:  ?Procedure Laterality Date  ? CIRCUMCISION  as a young girl   ? Type III femake circumcision   ? DILATION AND CURETTAGE OF UTERUS  2006  ? HEMORRHOID SURGERY  2015  ? HEMORRHOID SURGERY    ? in [place]  ? ?Past Medical History:  ?Diagnosis Date  ? Irritable bowel syndrome (IBS)   ? Prediabetes   ? ?BP 111/69   Pulse (!) 106   Ht 5' (1.524 m)   Wt 220 lb 6.4 oz (100 kg)   SpO2 96%   BMI 43.04 kg/m?  ? ?Opioid Risk Score:   ?Fall Risk  Score:  `1 ? ?Depression screen PHQ 2/9 ? ? ?  01/17/2022  ? 10:21 AM 03/16/2021  ?  3:02 PM 08/18/2020  ? 10:03 AM 02/27/2017  ?  3:48 PM 06/05/2016  ? 11:58 AM 05/04/2016  ? 10:44 AM 04/06/2016  ? 10:33 AM  ?Depression screen PHQ 2/9  ?Decreased Interest 0 0 0 0 0 0 0  ?Down, Depressed, Hopeless 0 0 0 0 0 0 0  ?PHQ - 2 Score 0 0 0 0 0 0 0  ?Altered sleeping 1 0 0 0     ?Tired, decreased energy 0 0 0 0     ?Change in appetite 0 0 0 0     ?Feeling bad or failure about yourself  0 0 0 0     ?Trouble concentrating 1 0 0 0     ?Moving slowly or fidgety/restless 0 0 0 0     ?Suicidal thoughts 0 0 0 0     ?PHQ-9 Score 2 0 0 0     ?  ? ?Review of Systems  ?Constitutional: Negative.   ?HENT: Negative.    ?Eyes: Negative.   ?Respiratory: Negative.    ?Cardiovascular: Negative.   ?Gastrointestinal: Negative.   ?Endocrine: Negative.   ?     DM   ?Genitourinary: Negative.   ?Musculoskeletal:  Positive for back pain and neck pain.  ?Skin: Negative.   ?Neurological:  Positive for numbness.  ?     Numbness and tingling in both arms and has had issues down both legs as well  ?Hematological: Negative.   ?Psychiatric/Behavioral: Negative.    ?All other systems reviewed and are negative. ? ?   ?Objective:  ? Physical Exam ? ? ?Physical Exam ?Gen: no distress, normal appearing, very pleasant ?HEENT: oral mucosa pink and moist, NCAT ?Cardio: Reg rate, no MRG ?Chest: normal effort, normal rate of breathing ?Abd: soft, non-distended ?Ext: no edema ?Psych: pleasant, normal affect ?Skin: intact, warm and dry ?Neuro: Follows commands, speech fluent ?CN 2-12 intact ?Musculoskeletal:  ?Strength 5/5 in b/l UE ?5/5 in b/l LE ?No joint swelling noted ?Decreased C spine movement in all direction ?Normal finger to nose and heel to shin ?Neg hoffmans b/l ?DTR 1+ in b/l BR, Bic, Tric ?DTR 1+ in b/l Knee and 1+ ankle ?Spurlings test negative  ?Sensation intact to LT in all 4 extremities, both sides of hands and feet ?Tinels neg at wrist and elbow b/l ?No ankle clonus  ?Tone normal b/l ?ROM  decreased L spine motion in all directions ?SLR neg B/L ?Very Tender to palpation throught paraspinals, trapezius. Also tender in  hips, L elbow, b/l shoulders, b/l knees, SI joint ?16/18 tender points noted ? ? ? ? ? ?C Spine MRI 11/26/21 ? ?FINDINGS: ?Alignment: Reversal of the normal cervical lordosis with apex at ?C4-5. No listhesis. ?  ?Vertebrae: Vertebral body height maintained without acute or chronic ?fracture. Bone marrow signal intensity within normal limits. No ?discrete or worrisome osseous lesions. No abnormal marrow edema. ?  ?Cord: Normal signal and morphology. ?  ?Posterior Fossa, vertebral arteries, paraspinal tissues: Visualized ?brain and posterior fossa within normal limits. Craniocervical ?junction normal. Paraspinous and prevertebral soft tissues within ?normal limits.  Normal intravascular flow voids seen within the ?vertebral arteries bilaterally. ?  ?Disc levels: ?  ?C2-C3: Unremarkable. ?  ?C3-C4: Minimal uncovertebral spurring without significant disc ?bulge. No canal or foraminal stenosis. ?  ?C4-C5: Minimal uncovertebral spurring without significant disc ?bulge. No canal or foraminal stenosis. ?  ?C5-C6:  Minimal uncovertebral hypertrophy without significant disc ?bulge. No canal or foraminal stenosis. ?  ?C6-C7:  Unremarkable. ?  ?C7-T1:  Unremarkable. ?  ?Visualized upper thoracic spine demonstrates no significant finding. ?  ?IMPRESSION: ?1. Reversal of the normal cervical lordosis with apex at C4-5. ?2. Minimal uncovertebral spurring at C3-4 through C5-6 without ?significant disc bulge or focal disc herniation. No canal or ?foraminal stenosis or evidence for neural impingement. ? ? ?L spine MRI 06/07/21 ?FINDINGS: ?Segmentation:  5 lumbar type vertebral bodies. ?  ?Alignment:  Minimal scoliotic curvature convex to the right. ?  ?Vertebrae:  No fracture or focal lesion. ?  ?Conus medullaris and cauda equina: Conus extends to the T12-L1 ?level. Conus and cauda equina appear normal. ?  ?Paraspinal and other soft tissues: Negative ?  ?Disc levels: ?  ?No abnormality at L2-3 or above. ?  ?L3-4: Mild bulging of the disc. No compressive stenosis. No change ?since 2016. ?  ?L4-5: Disc degeneration with loss of disc height. Chronic shallow ?disc protrusion with slight caudal down turning. Slight indentation ?of the thecal sac and mild stenosis of the lateral recesses but ?without definite neural compression. No progressive change since ?2016. The disc material has desiccated slightly. ?  ?L5-S1: Disc degeneration with bulging of the disc and a shallow ?protrusion towards the left. Mild facet and ligamentous hypertrophy. ?Stenosis of the subarticular lateral recess on the left that could ?possibly affect the left S1 nerve. The left subarticular lateral ?recess stenosis may be  slightly more pronounced than was seen ?previously. The disc material has desiccated slightly. ?  ?IMPRESSION: ?L3-4: Minor, non-compressive disc bulge, unchanged. ?  ?L4-5: Shallow disc protrusion with slight caudal down turning.

## 2022-01-18 ENCOUNTER — Ambulatory Visit (HOSPITAL_BASED_OUTPATIENT_CLINIC_OR_DEPARTMENT_OTHER): Payer: 59 | Attending: Physician Assistant | Admitting: Internal Medicine

## 2022-01-18 VITALS — Ht 65.0 in | Wt 200.0 lb

## 2022-01-18 DIAGNOSIS — G4733 Obstructive sleep apnea (adult) (pediatric): Secondary | ICD-10-CM | POA: Diagnosis not present

## 2022-01-18 DIAGNOSIS — R0683 Snoring: Secondary | ICD-10-CM | POA: Insufficient documentation

## 2022-01-18 DIAGNOSIS — G4761 Periodic limb movement disorder: Secondary | ICD-10-CM | POA: Insufficient documentation

## 2022-01-29 DIAGNOSIS — R0683 Snoring: Secondary | ICD-10-CM | POA: Diagnosis not present

## 2022-01-29 NOTE — Procedures (Signed)
? ? ? ?Patient Name: Candice [name], Candice [name] ?Study Date: 01/18/2022 ?Gender: Female ?D.O.B: Feb 21, 1976 ?Age (years): 14 ?Referring Provider: Argentina Donovan PA-C ?Height (inches): 65 ?Interpreting Physician: Baird Lyons MD, ABSM ?Weight (lbs): 200 ?RPSGT: Jorge Ny ?BMI: 33 ?MRN: [id number] ?Neck Size: 14.50 ? ?CLINICAL INFORMATION ?Sleep Study Type: NPSG ?Indication for sleep study: Diabetes, Fatigue, Insomnia, Non-refreshing Sleep, Snoring ?Epworth Sleepiness Score:NA ? ?SLEEP STUDY TECHNIQUE ?As per the AASM Manual for the Scoring of Sleep and Associated Events v2.3 (April 2016) with a hypopnea requiring 4% desaturations. ? ?The channels recorded and monitored were frontal, central and occipital EEG, electrooculogram (EOG), submentalis EMG (chin), nasal and oral airflow, thoracic and abdominal wall motion, anterior tibialis EMG, snore microphone, electrocardiogram, and pulse oximetry. ? ?MEDICATIONS ?Medications self-administered by patient taken the night of the study : none reported ? ?SLEEP ARCHITECTURE ?The study was initiated at 11:12:10 PM and ended at 5:00:48 AM. ? ?Sleep onset time was 12.7 minutes and the sleep efficiency was 80.3%%. The total sleep time was 279.9 minutes. ? ?Stage REM latency was 168.5 minutes. ? ?The patient spent 5.0%% of the night in stage N1 sleep, 52.5%% in stage N2 sleep, 28.0%% in stage N3 and 14.5% in REM. ? ?Alpha intrusion was absent. ? ?Supine sleep was 52.30%. ? ?RESPIRATORY PARAMETERS ?The overall apnea/hypopnea index (AHI) was 10.3 per hour. There were 0 total apneas, including 0 obstructive, 0 central and 0 mixed apneas. There were 48 hypopneas and 1 RERAs. ? ?The AHI during Stage REM sleep was 34.1 per hour. ? ?AHI while supine was 15.2 per hour. ? ?The mean oxygen saturation was 95.0%. The minimum SpO2 during sleep was 88.0%. ? ?soft snoring was noted during this study. ? ?CARDIAC DATA ?The 2 lead EKG demonstrated sinus rhythm. The mean heart rate was 91.1 beats per  minute. Other EKG findings include: None. ?LEG MOVEMENT DATA ?The total PLMS were 0 with a resulting PLMS index of 0.0. Associated arousal with leg movement index was 14.4 . ? ?IMPRESSIONS ?- Mild obstructive sleep apnea occurred during this study (AHI = 10.3/h). ?- The patient had minimal or no oxygen desaturation during the study (Min O2 = 88.0%) Mean 95%. ?- The patient snored with soft snoring volume. ?- No cardiac abnormalities were noted during this study. ?- Limb Movement total 145 (31.1/ hr).  Limb movement with arousal 67 (14.4/ hr). ? ?DIAGNOSIS ?- Obstructive Sleep Apnea (G47.33) ?- Periodic Limb Movement with arousal. ? ?RECOMMENDATIONS ?- Treatment for mild OSA is directed by symptoms and co-morbidity. Conservative measures may include observation, weight loss and sleep position off back. Other options, including CPAP. a fitted oral appliance or ENT evaluation, would be based on clinical judgment.. ?- Consider trial of Mirapex, Requip, or Sinemet for treatment of Periodic Leg Movements of Sleep. ?- Be careful with alcohol, sedatives and other CNS depressants that may worsen sleep apnea and disrupt normal sleep architecture. ?- Sleep hygiene should be reviewed to assess factors that may improve sleep quality. ?- Weight management and regular exercise should be initiated or continued if appropriate. ? ?[Electronically signed] 01/29/2022 10:51 AM ? ?Baird Lyons MD, ABSM ?Diplomate, Tax adviser of Sleep Medicine ?NPI: [id number] ?  ? ? ? ? ? ? ? ? ? ? ? ? ? ? ? ? ? ? ? ? ? ?[name] ?Diplomate, Tax adviser of Sleep Medicine ? ?ELECTRONICALLY SIGNED ON:  01/29/2022, 10:53 AM ?[facility] ?PH: [phone]   FX: [phone] ?ACCREDITED BY THE AMERICAN ACADEMY OF  SLEEP MEDICINE ?

## 2022-02-07 ENCOUNTER — Encounter: Payer: Self-pay | Admitting: Nurse Practitioner

## 2022-02-17 ENCOUNTER — Encounter: Payer: 59 | Attending: Physical Medicine & Rehabilitation | Admitting: Physical Medicine & Rehabilitation

## 2022-02-17 ENCOUNTER — Other Ambulatory Visit: Payer: Self-pay

## 2022-02-17 ENCOUNTER — Encounter: Payer: Self-pay | Admitting: Physical Medicine & Rehabilitation

## 2022-02-17 VITALS — BP 144/98 | HR 111 | Ht 65.0 in | Wt 221.0 lb

## 2022-02-17 DIAGNOSIS — F0630 Mood disorder due to known physiological condition, unspecified: Secondary | ICD-10-CM | POA: Insufficient documentation

## 2022-02-17 DIAGNOSIS — M797 Fibromyalgia: Secondary | ICD-10-CM | POA: Insufficient documentation

## 2022-02-17 DIAGNOSIS — M47816 Spondylosis without myelopathy or radiculopathy, lumbar region: Secondary | ICD-10-CM | POA: Diagnosis present

## 2022-02-17 MED ORDER — CYCLOBENZAPRINE HCL 5 MG PO TABS
5.0000 mg | ORAL_TABLET | Freq: Three times a day (TID) | ORAL | 2 refills | Status: DC | PRN
Start: 2022-02-17 — End: 2022-04-18
  Filled 2022-02-17 – 2022-02-25 (×2): qty 30, 10d supply, fill #0

## 2022-02-17 MED ORDER — DULOXETINE HCL 30 MG PO CPEP
60.0000 mg | ORAL_CAPSULE | Freq: Every day | ORAL | 3 refills | Status: DC
  Filled 2022-02-17 – 2022-02-25 (×2): qty 30, 15d supply, fill #0

## 2022-02-17 NOTE — Progress Notes (Signed)
Subjective:    Patient ID: Candice [name], female    DOB: 11/23/1975, 46 y.o.   MRN: [id number]  HPI  Candice [name] is a 46 yo female with PMH of lumbar back pain with radiculopathy here for neck and shoulder pain.  She reports neck and back pain since 2015. Pain is tingling, stabbing and shooting. She also has pain in her legs worse on the left. Pain radiates to all her fingers and toes.  Bending worsens the pain.  Inactivity and sitting also worsens the pain.  She does not exercise and is not very active a this time.  She had PT with minimal benefit. She reports general fatigue and sleep disturbance,often hard to fall asleep. Prior ESI x2 reports that helped her leg pain.  She uses tylenol in the past with minimal benefit and stopped due to concerns it would damage her liver if used chronically.  Flexeril and duloxetine help improve her pain.  No bowel or bladder changes.  Interval History Patient is here for follow-up for management of her chronic pain.  Patient continues to have pain throughout her body in her legs arms neck and back.  She does feel that she has had improvement with duloxetine.  She notices an improvement in her pain after taking this medication.  She also takes Flexeril and this also helps to keep her pain under control.  She has continued with progressive home exercise and reports her tolerance to activity is increasing.  She denies any side effects to the medication.  Pain Inventory Average Pain 8 Pain Right Now 8 My pain is aching  In the last 24 hours, has pain interfered with the following? General activity 9 Relation with others 9 Enjoyment of life 10 What TIME of day is your pain at its worst? morning , daytime, evening, and night Sleep (in general) Fair  Pain is worse with: walking, bending, sitting, standing, and some activites Pain improves with: rest, therapy/exercise, and medication Relief from Meds:  not answered  Family History  Problem Relation Age  of Onset   Hypertension Father    Social History   Socioeconomic History   Marital status: Married    Spouse name: Not on file   Number of children: Not on file   Years of education: Not on file   Highest education level: Not on file  Occupational History   Not on file  Tobacco Use   Smoking status: Never   Smokeless tobacco: Never  Vaping Use   Vaping Use: Never used  Substance and Sexual Activity   Alcohol use: No   Drug use: No   Sexual activity: Yes  Other Topics Concern   Not on file  Social History Narrative   Not on file   Social Determinants of Health   Financial Resource Strain: Not on file  Food Insecurity: Not on file  Transportation Needs: Not on file  Physical Activity: Not on file  Stress: Not on file  Social Connections: Not on file   Past Surgical History:  Procedure Laterality Date   CIRCUMCISION  as a young girl    Type III femake circumcision    DILATION AND CURETTAGE OF UTERUS  2006   HEMORRHOID SURGERY  2015   HEMORRHOID SURGERY     in [place]   Past Surgical History:  Procedure Laterality Date   CIRCUMCISION  as a young girl    Type III femake circumcision    [place] OF UTERUS  2006  HEMORRHOID SURGERY  2015   HEMORRHOID SURGERY     in [place]   Past Medical History:  Diagnosis Date   Irritable bowel syndrome (IBS)    Prediabetes    BP (!) 144/98   Pulse (!) 111   Ht _0  (1.651 m)   Wt 221 lb (100.2 kg)   SpO2 95%   BMI 36.78 kg/m   Opioid Risk Score:   Fall Risk Score:  `1  Depression screen [facility] 2/9     01/17/2022   10:21 AM 03/16/2021    3:02 PM 08/18/2020   10:03 AM 02/27/2017    3:48 PM 06/05/2016   11:58 AM 05/04/2016   10:44 AM 04/06/2016   10:33 AM  Depression screen PHQ 2/9  Decreased Interest 0 0 0 0 0 0 0  Down, Depressed, Hopeless 0 0 0 0 0 0 0  PHQ - 2 Score 0 0 0 0 0 0 0  Altered sleeping 1 0 0 0     Tired, decreased energy 0 0 0 0     Change in appetite 0 0 0 0     Feeling bad or failure  about yourself  0 0 0 0     Trouble concentrating 1 0 0 0     Moving slowly or fidgety/restless 0 0 0 0     Suicidal thoughts 0 0 0 0     PHQ-9 Score 2 0 0 0        Review of Systems  Constitutional: Negative.   HENT: Negative.    Eyes: Negative.   Respiratory: Negative.    Cardiovascular: Negative.   Gastrointestinal: Negative.   Endocrine: Negative.   Genitourinary: Negative.   Musculoskeletal:  Positive for myalgias.  Skin: Negative.   Allergic/Immunologic: Negative.   Neurological: Negative.   Hematological: Negative.   Psychiatric/Behavioral: Negative.    All other systems reviewed and are negative.     Objective:   Physical Exam  Physical Exam Gen: no distress, normal appearing, very pleasant HEENT: oral mucosa pink and moist, NCAT Chest: normal effort, normal rate of breathing Abd: soft, non-distended Ext: no edema Psych: pleasant, normal affect Skin: intact, warm and dry Neuro: Follows commands, speech fluent, she has an interpreter CN 2-12 grossly  intact Musculoskeletal:  Strength 5/5 in b/l UE 5/5 in b/l LE No joint swelling noted Decreased C spine movement in all direction Sensation intact to LT in all 4 extremities, both sides of hands and feet Tone normal b/l ROM  decreased L spine motion in all directions Tender to palpation throught paraspinals, trapezius. Also tender in  hips, L elbow, b/l shoulders, b/l knees, SI joint.  Tenderness appears improved from last visit         Assessment & Plan:   Fibromyalgia -MRI Cspine does not indicate likely spinal cause of overall pain -Advised to continue low intensity progressive activity/exercise  -Continue duloxetine uloxetine to 66m daily, continue Flexeril as needed -Can consider gabapentin or Lyrica as additional option for pain control at later time, however she is doing fairly well and making progress with her activity tolerance -Discussed consideration of yoga or tai chi  Lumbar back pain with  radiculopathy, Lumbar disc degeneration -S/P ESI by Dr. NErnestina Patcheswith improvement -May be contributing component to her overall pain -Duloxetine may be providing benefit for this as well    Mood disorder -No SI or HI -Suspect duloxetine is helping her mood

## 2022-02-24 ENCOUNTER — Ambulatory Visit: Payer: 59 | Attending: Nurse Practitioner | Admitting: Nurse Practitioner

## 2022-02-24 ENCOUNTER — Encounter: Payer: Self-pay | Admitting: Nurse Practitioner

## 2022-02-24 ENCOUNTER — Other Ambulatory Visit: Payer: Self-pay

## 2022-02-24 VITALS — BP 144/92 | HR 98 | Ht 65.0 in | Wt 217.6 lb

## 2022-02-24 DIAGNOSIS — R1314 Dysphagia, pharyngoesophageal phase: Secondary | ICD-10-CM | POA: Diagnosis not present

## 2022-02-24 DIAGNOSIS — R0981 Nasal congestion: Secondary | ICD-10-CM

## 2022-02-24 DIAGNOSIS — G4733 Obstructive sleep apnea (adult) (pediatric): Secondary | ICD-10-CM | POA: Diagnosis not present

## 2022-02-24 DIAGNOSIS — R7303 Prediabetes: Secondary | ICD-10-CM | POA: Diagnosis not present

## 2022-02-24 LAB — POCT GLYCOSYLATED HEMOGLOBIN (HGB A1C): HbA1c, POC (controlled diabetic range): 6.2 % (ref 0.0–7.0)

## 2022-02-24 MED ORDER — METFORMIN HCL 500 MG PO TABS
500.0000 mg | ORAL_TABLET | Freq: Every day | ORAL | 1 refills | Status: DC
  Filled 2022-02-24: qty 90, 90d supply, fill #0

## 2022-02-24 MED ORDER — METFORMIN HCL 500 MG PO TABS: 500.0000 mg | ORAL_TABLET | Freq: Every day | ORAL | 1 refills | Status: DC

## 2022-02-24 MED ORDER — LORATADINE 10 MG PO TABS
10.0000 mg | ORAL_TABLET | Freq: Every day | ORAL | 11 refills | Status: DC
  Filled 2022-02-24: qty 30, 30d supply, fill #0

## 2022-02-24 MED ORDER — LORATADINE 10 MG PO TABS: 10.0000 mg | ORAL_TABLET | Freq: Every day | ORAL | 1 refills | Status: DC

## 2022-02-24 NOTE — Patient Instructions (Addendum)
Sent Referral to Dr Janeece Riggers [facility] Ph# [phone] Fax # [phone] address [address] [place], [place] [address] They will contact the patient to schedule an appointment.   [facility] Located in: [facility] Address: [address], [place], [place] [address] Hours:  Open ? Closes 6?PM Phone: [phone]

## 2022-02-24 NOTE — Progress Notes (Signed)
Assessment & Plan:  Candice [name] was seen today for prediabetes.  Diagnoses and all orders for this visit:  Prediabetes Well-controlled.  Continue metformin 500 mg daily at this time -     POCT glycosylated hemoglobin (Hb A1C) -     CMP14+EGFR -     metFORMIN (GLUCOPHAGE) 500 MG tablet; Take 1 tablet (500 mg total) by mouth daily with breakfast.  Obstructive sleep apnea syndrome Prior sleep apnea results were discussed in office today in detail.  Pharyngoesophageal dysphagia -     Ambulatory referral to ENT  Nasal congestion Continue Flonase as prescribed -     Ambulatory referral to ENT -     loratadine (CLARITIN) 10 MG tablet; Take 1 tablet (10 mg total) by mouth daily.    Patient has been counseled on age-appropriate routine health concerns for screening and prevention. These are reviewed and up-to-date. Referrals have been placed accordingly. Immunizations are up-to-date or declined.    Subjective:   Chief Complaint  Patient presents with   Prediabetes   HPI Candice [name] 46 y.o. female presents to office today for follow up to prediabetes.   VRI was used to communicate directly with patient for the entire encounter including providing detailed patient instructions.     She declines mammogram.  She states she had PAP smear done few months ago with gynecologist Waymon Amato and Pap smear was normal.   Prediabetes A1c continues in prediabetes range with metformin Lab Results  Component Value Date   HGBA1C 6.2 02/24/2022     Blood pressure is elevated today.  I recommend that she obtain a large blood pressure cuff/monitoring device and we will go over her home blood pressure log in 2 to 3 weeks.   BP Readings from Last 3 Encounters:  02/24/22 (!) 144/92  02/17/22 (!) 144/98  01/17/22 111/69     She was diagnosed with mild OSA> recommendations were for weight loss and good sleep hygiene. At this time she declines CPAP titration study and we will continue to monitor her  for worsening symptoms of sleep apnea for now.    ENT REFERRAL She is requesting an ENT referral for chronic rhinorrhea and nasal congestion with only temporary intermittent improvement using Flonase nasal spray  She also endorses dysphagia with liquids.  States can only take small sips of liquids at a time. If she tries to take larger amounts of swallow she is unable to swallow it and has to hold it in her mouth.  Surprisingly she denies any dysphagia with solids are taking her pills. Onset of symptoms over 20 years ago and she has never seen a specialist for this.     Review of Systems  Constitutional:  Negative for fever, malaise/fatigue and weight loss.  HENT:  Positive for congestion. Negative for nosebleeds.        SEE HPI  Eyes: Negative.  Negative for blurred vision, double vision and photophobia.  Respiratory: Negative.  Negative for cough and shortness of breath.   Cardiovascular: Negative.  Negative for chest pain, palpitations and leg swelling.  Gastrointestinal: Negative.  Negative for heartburn, nausea and vomiting.  Musculoskeletal: Negative.  Negative for myalgias.  Neurological: Negative.  Negative for dizziness, focal weakness, seizures and headaches.  Psychiatric/Behavioral: Negative.  Negative for suicidal ideas.     Past Medical History:  Diagnosis Date   Irritable bowel syndrome (IBS)    Prediabetes     Past Surgical History:  Procedure Laterality Date   CIRCUMCISION  as a  young girl    Type III femake circumcision    DILATION AND CURETTAGE OF UTERUS  2006   HEMORRHOID SURGERY  2015   HEMORRHOID SURGERY     in [place]    Family History  Problem Relation Age of Onset   Hypertension Father     Social History Reviewed with no changes to be made today.   Outpatient Medications Prior to Visit  Medication Sig Dispense Refill   Blood Glucose Monitoring Suppl (ONETOUCH VERIO REFLECT) w/Device KIT use daily as directed 1 kit 0   cyclobenzaprine (FLEXERIL)  5 MG tablet Take 1 tablet (5 mg total) by mouth 3 (three) times daily as needed for muscle spasms. 30 tablet 2   DULoxetine (CYMBALTA) 30 MG capsule Take 2 capsules (60 mg total) by mouth daily. 30 capsule 3   fluticasone (FLONASE) 50 MCG/ACT nasal spray Place 2 sprays into both nostrils daily. 16 g 6   glucose blood (ONETOUCH VERIO) test strip Use as instructed 100 each 12   ibuprofen (ADVIL) 600 MG tablet Take 1 tablet (600 mg total) by mouth every 6 (six) hours as needed. 30 tablet 0   metFORMIN (GLUCOPHAGE) 500 MG tablet Take 1 tablet (500 mg total) by mouth daily with breakfast. 90 tablet 1   No facility-administered medications prior to visit.    No Known Allergies     Objective:    BP (!) 144/92   Pulse 98   Ht '5\' 5"'  (1.651 m)   Wt 217 lb 9.6 oz (98.7 kg)   SpO2 98%   BMI 36.21 kg/m  Wt Readings from Last 3 Encounters:  02/24/22 217 lb 9.6 oz (98.7 kg)  02/17/22 221 lb (100.2 kg)  01/18/22 200 lb (90.7 kg)    Physical Exam Vitals and nursing note reviewed.  Constitutional:      Appearance: She is well-developed.  HENT:     Head: Normocephalic and atraumatic.     Right Ear: Hearing, ear canal and external ear normal. A middle ear effusion is present.     Left Ear: Hearing, ear canal and external ear normal. A middle ear effusion is present.     Nose:     Right Turbinates: Enlarged, swollen and pale.     Left Turbinates: Enlarged, swollen and pale.     Mouth/Throat:     Mouth: Mucous membranes are moist.     Palate: No mass and lesions.     Pharynx: Oropharynx is clear. Uvula midline.     Tonsils: No tonsillar exudate or tonsillar abscesses. 1+ on the right. 1+ on the left.  Cardiovascular:     Rate and Rhythm: Normal rate and regular rhythm.     Heart sounds: Normal heart sounds. No murmur heard.    No friction rub. No gallop.  Pulmonary:     Effort: Pulmonary effort is normal. No tachypnea or respiratory distress.     Breath sounds: Normal breath sounds. No  decreased breath sounds, wheezing, rhonchi or rales.  Chest:     Chest wall: No tenderness.  Abdominal:     General: Bowel sounds are normal.     Palpations: Abdomen is soft.  Musculoskeletal:        General: Normal range of motion.     Cervical back: Normal range of motion.  Skin:    General: Skin is warm and dry.  Neurological:     Mental Status: She is alert and oriented to person, place, and time.     Coordination:  Coordination normal.  Psychiatric:        Behavior: Behavior normal. Behavior is cooperative.        Thought Content: Thought content normal.        Judgment: Judgment normal.          Patient has been counseled extensively about nutrition and exercise as well as the importance of adherence with medications and regular follow-up. The patient was given clear instructions to go to ER or return to medical center if symptoms don't improve, worsen or new problems develop. The patient verbalized understanding.   Follow-up: Return in about 3 weeks (around 03/17/2022) for BP Check. Virtual visit with me on a tuesday. 3 weeks.   Gildardo Pounds, FNP-BC [facility] [place], [place]   02/24/2022, 1:09 PM

## 2022-02-25 LAB — CMP14+EGFR
ALT: 17 IU/L (ref 0–32)
AST: 19 IU/L (ref 0–40)
Albumin/Globulin Ratio: 1.2 (ref 1.2–2.2)
Albumin: 4.2 g/dL (ref 3.8–4.8)
Alkaline Phosphatase: 140 IU/L — ABNORMAL HIGH (ref 44–121)
BUN/Creatinine Ratio: 15 (ref 9–23)
BUN: 9 mg/dL (ref 6–24)
Bilirubin Total: <0.2 mg/dL (ref 0.0–1.2)
CO2: 24 mmol/L (ref 20–29)
Calcium: 9.6 mg/dL (ref 8.7–10.2)
Chloride: 100 mmol/L (ref 96–106)
Creatinine, Ser: 0.61 mg/dL (ref 0.57–1.00)
Globulin, Total: 3.4 g/dL (ref 1.5–4.5)
Glucose: 104 mg/dL — ABNORMAL HIGH (ref 70–99)
Potassium: 4 mmol/L (ref 3.5–5.2)
Sodium: 141 mmol/L (ref 134–144)
Total Protein: 7.6 g/dL (ref 6.0–8.5)
eGFR: 114 mL/min/{1.73_m2} (ref >59)

## 2022-02-27 ENCOUNTER — Other Ambulatory Visit: Payer: Self-pay

## 2022-03-06 ENCOUNTER — Other Ambulatory Visit: Payer: Self-pay

## 2022-03-06 ENCOUNTER — Telehealth: Payer: Self-pay | Admitting: Nurse Practitioner

## 2022-03-06 NOTE — Telephone Encounter (Signed)
Copied from CRM [id number]. Topic: Referral - Status >> Mar 03, 2022  2:27 PM Lyman Speller wrote: Reason for CRM: Dr. Suszanne Conners can see pt for sinus issues but he doenst see adult pts for throat issues as of March 1st/ please advise if this is ok or if the pt needs a new ENT referral where she can get everything completed

## 2022-03-08 NOTE — Telephone Encounter (Signed)
Patient is needing new referral to ENT

## 2022-03-13 ENCOUNTER — Other Ambulatory Visit: Payer: Self-pay | Admitting: Nurse Practitioner

## 2022-03-13 DIAGNOSIS — R1314 Dysphagia, pharyngoesophageal phase: Secondary | ICD-10-CM

## 2022-03-13 NOTE — Telephone Encounter (Signed)
Referral for dysphagia sent to GI

## 2022-03-28 ENCOUNTER — Encounter: Payer: Self-pay | Admitting: Nurse Practitioner

## 2022-03-28 ENCOUNTER — Ambulatory Visit: Payer: 59 | Attending: Nurse Practitioner | Admitting: Nurse Practitioner

## 2022-03-28 DIAGNOSIS — J3089 Other allergic rhinitis: Secondary | ICD-10-CM

## 2022-03-28 DIAGNOSIS — K649 Unspecified hemorrhoids: Secondary | ICD-10-CM | POA: Diagnosis not present

## 2022-03-28 MED ORDER — HYDROCORT-PRAMOXINE (PERIANAL) 2.5-1 % EX CREA: 1.0000 | TOPICAL_CREAM | Freq: Three times a day (TID) | CUTANEOUS | 0 refills | Status: DC

## 2022-03-28 NOTE — Progress Notes (Signed)
Virtual Visit via Telephone Note  I discussed the limitations, risks, security and privacy concerns of performing an evaluation and management service by telephone and the availability of in person appointments. I also discussed with the patient that there may be a patient responsible charge related to this service. The patient expressed understanding and agreed to proceed.    I connected with Candice [name] on 03/28/22  at  10:50 AM EDT  EDT by telephone and verified that I am speaking with the correct person using two identifiers.  Location of Patient: Private Residence   [facility] of Provider: [facility]    Persons participating in Telemedicine visit: Bertram Denver FNP-BC Netta Giorgio  Arabic Interpreter [phone]   History of Present Illness: Telemedicine visit for: F/U to chronic rhinorrhea and nasal congestion  She was started on claritin last month after reporting no relief of symptoms with flonase spray. Associated symptoms at that time included scratchy throat, bilateral ear pressure and dry cough. Today she reports symptoms have improved since starting the claritin and continuing with the nose spray.  She is requesting hemorrhoidal ointment/cream for current flare and pain in rectum.     Past Medical History:  Diagnosis Date   Irritable bowel syndrome (IBS)    Prediabetes     Past Surgical History:  Procedure Laterality Date   CIRCUMCISION  as a young girl    Type III femake circumcision    DILATION AND CURETTAGE OF UTERUS  2006   HEMORRHOID SURGERY  2015   HEMORRHOID SURGERY     in [place]    Family History  Problem Relation Age of Onset   Hypertension Father     Social History   Socioeconomic History   Marital status: Married    Spouse name: Not on file   Number of children: Not on file   Years of education: Not on file   Highest education level: Not on file  Occupational History   Not on file  Tobacco Use   Smoking  status: Never   Smokeless tobacco: Never  Vaping Use   Vaping Use: Never used  Substance and Sexual Activity   Alcohol use: No   Drug use: No   Sexual activity: Yes  Other Topics Concern   Not on file  Social History Narrative   Not on file   Social Determinants of Health   Financial Resource Strain: Not on file  Food Insecurity: Not on file  Transportation Needs: Not on file  Physical Activity: Not on file  Stress: Not on file  Social Connections: Not on file     Observations/Objective: Awake, alert and oriented x 3   Review of Systems  Constitutional:  Negative for fever, malaise/fatigue and weight loss.  HENT: Negative.  Negative for nosebleeds.   Eyes: Negative.  Negative for blurred vision, double vision and photophobia.  Respiratory: Negative.  Negative for cough and shortness of breath.   Cardiovascular: Negative.  Negative for chest pain, palpitations and leg swelling.  Gastrointestinal:  Positive for constipation. Negative for abdominal pain, blood in stool, diarrhea, heartburn, melena, nausea and vomiting.       SEE HPI  Musculoskeletal: Negative.  Negative for myalgias.  Neurological: Negative.  Negative for dizziness, focal weakness, seizures and headaches.  Endo/Heme/Allergies:  Positive for environmental allergies.  Psychiatric/Behavioral: Negative.  Negative for suicidal ideas.     Assessment and Plan: Diagnoses and all orders for this visit:  Environmental and seasonal allergies Continue claritin and flonase daily  Hemorrhoids, unspecified hemorrhoid type -     hydrocortisone-pramoxine (ANALPRAM-HC) 2.5-1 % rectal cream; Place 1 Application rectally 3 (three) times daily.     Follow Up Instructions Return if symptoms worsen or fail to improve.     I discussed the assessment and treatment plan with the patient. The patient was provided an opportunity to ask questions and all were answered. The patient agreed with the plan and demonstrated an  understanding of the instructions.   The patient was advised to call back or seek an in-person evaluation if the symptoms worsen or if the condition fails to improve as anticipated.  I provided 12 minutes of non-face-to-face time during this encounter including median intraservice time, reviewing previous notes, labs, imaging, medications and explaining diagnosis and management.  Claiborne Rigg, FNP-BC

## 2022-03-31 ENCOUNTER — Ambulatory Visit: Payer: 59 | Attending: Nurse Practitioner | Admitting: Pharmacist

## 2022-03-31 ENCOUNTER — Encounter: Payer: Self-pay | Admitting: Pharmacist

## 2022-03-31 VITALS — BP 143/90

## 2022-03-31 DIAGNOSIS — I10 Essential (primary) hypertension: Secondary | ICD-10-CM

## 2022-03-31 DIAGNOSIS — K649 Unspecified hemorrhoids: Secondary | ICD-10-CM | POA: Diagnosis not present

## 2022-03-31 MED ORDER — HYDROCORT-PRAMOXINE (PERIANAL) 2.5-1 % EX CREA: 1.0000 | TOPICAL_CREAM | Freq: Three times a day (TID) | CUTANEOUS | 0 refills | Status: DC

## 2022-03-31 MED ORDER — AMLODIPINE BESYLATE 2.5 MG PO TABS: 2.5000 mg | ORAL_TABLET | Freq: Every day | ORAL | 0 refills | Status: DC

## 2022-03-31 NOTE — Progress Notes (Signed)
S:     No chief complaint on file.  Tom Ragsdale is a 46 y.o. female who presents for hypertension evaluation, education, and management.  PMH is significant for elevated BP without the dx of HTN, preDM, DJD of lumbar spine, and allergic rhinitis. Patient was referred and last seen by Primary Care Provider, Bertram Denver on 02/24/2022.  At last visit, blood pressure was >140/90. Pt tells me she was nervous before that visit.   Today, patient arrives in good spirits and presents without assistance. Denies dizziness, headache, blurred vision, swelling.   Patient reports hypertension has never been diagnosed.   Family/Social history:  Fhx: HTN (father) Tobacco: never smoker  Alcohol: none reported   Medication adherence: she does not currently take BP medication.    Current antihypertensives include: none  Antihypertensives tried in the past include: none  Reported home BP readings:  SBPs: 107-148 DBPs: 80-105  Patient reported dietary habits: -Sodium: pt admits to intake of salty food in the past but tells me she recently stopped eating salty foods and fast foods  -Caffeine: tells me that she drinks a lot of caffeine. Pt tells me that she drinks soda throughout the day.   Patient-reported exercise habits: walks.   O:  Vitals:   03/31/22 1051  BP: (!) 143/90   Last 3 Office BP readings: BP Readings from Last 3 Encounters:  03/31/22 (!) 143/90  02/24/22 (!) 144/92  02/17/22 (!) 144/98    BMET    Component Value Date/Time   NA 141 02/24/2022 1346   K 4.0 02/24/2022 1346   CL 100 02/24/2022 1346   CO2 24 02/24/2022 1346   GLUCOSE 104 (H) 02/24/2022 1346   GLUCOSE 98 08/12/2021 1511   BUN 9 02/24/2022 1346   CREATININE 0.61 02/24/2022 1346   CREATININE 0.69 06/20/2021 0959   CREATININE 0.59 04/07/2015 1201   CALCIUM 9.6 02/24/2022 1346   GFRNONAA >60 08/12/2021 1511   GFRNONAA >60 06/20/2021 0959   GFRNONAA >89 10/01/2014 1026   GFRAA >60 06/03/2019 0105    GFRAA >89 10/01/2014 1026    Renal function: CrCl cannot be calculated (Patient's most recent lab result is older than the maximum 21 days allowed.).  Clinical ASCVD: No  The 10-year ASCVD risk score (Arnett DK, et al., 2019) is: 2.7%   Values used to calculate the score:     Age: 52 years     Sex: Female     Is Non-Hispanic African American: Yes     Diabetic: Yes     Tobacco smoker: No     Systolic Blood Pressure: 143 mmHg     Is BP treated: No     HDL Cholesterol: 64 mg/dL     Total Cholesterol: 214 mg/dL    A/P: Hypertension undiagnosed currently meeting criteria for stage II hypertension given today's clinic reading and past readings over the last couple of months. She is not on medication currently. BP goal < 130/80 mmHg. She is weary of adding medication today and insists that she use the smallest dose needed.  -Started amlodipine 2.5 mg daily.  -Patient educated on purpose, proper use, and potential adverse effects of amlodipine.  -Counseled on lifestyle modifications for blood pressure control including reduced dietary sodium, increased exercise, adequate sleep. -Encouraged patient to check BP at home and bring log of readings to next visit. Counseled on proper use of home BP cuff.  -Extensive lifestyle counseling given today. Discussed role of BP medication and control of BP moving  forward. Pt would prefer to optimize lifestyle vs adding medication. She is amenable to starting low-dose amlodipine and return in 1 month for reassessment.   Results reviewed and written information provided.    Written patient instructions provided. Patient verbalized understanding of treatment plan.  Total time in face to face counseling 30 minutes.    Follow-up:  Pharmacist in 1 month.  Butch Penny, PharmD, Patsy Baltimore, CPP Clinical Pharmacist [facility] [phone]

## 2022-04-03 ENCOUNTER — Other Ambulatory Visit: Payer: Self-pay

## 2022-04-04 ENCOUNTER — Other Ambulatory Visit: Payer: Self-pay | Admitting: Physical Medicine & Rehabilitation

## 2022-04-10 ENCOUNTER — Ambulatory Visit: Payer: 59 | Admitting: Nurse Practitioner

## 2022-04-18 ENCOUNTER — Other Ambulatory Visit: Payer: Self-pay

## 2022-04-18 ENCOUNTER — Encounter: Payer: 59 | Admitting: Physical Medicine & Rehabilitation

## 2022-04-18 MED ORDER — CYCLOBENZAPRINE HCL 5 MG PO TABS: 5.0000 mg | ORAL_TABLET | Freq: Three times a day (TID) | ORAL | 2 refills | Status: DC | PRN

## 2022-04-18 MED ORDER — DULOXETINE HCL 60 MG PO CPEP: 60.0000 mg | ORAL_CAPSULE | Freq: Every day | ORAL | 2 refills | Status: DC

## 2022-04-18 NOTE — Telephone Encounter (Signed)
Patient informed. 

## 2022-05-01 ENCOUNTER — Ambulatory Visit: Payer: 59 | Admitting: Physical Medicine & Rehabilitation

## 2022-05-08 ENCOUNTER — Other Ambulatory Visit: Payer: Self-pay

## 2022-05-18 ENCOUNTER — Other Ambulatory Visit: Payer: Self-pay

## 2022-05-19 ENCOUNTER — Ambulatory Visit: Payer: 59 | Attending: Nurse Practitioner | Admitting: Pharmacist

## 2022-05-19 ENCOUNTER — Ambulatory Visit: Payer: 59 | Admitting: Pharmacist

## 2022-05-19 ENCOUNTER — Encounter: Payer: Self-pay | Admitting: Pharmacist

## 2022-05-19 ENCOUNTER — Other Ambulatory Visit: Payer: Self-pay

## 2022-05-19 DIAGNOSIS — K649 Unspecified hemorrhoids: Secondary | ICD-10-CM | POA: Diagnosis not present

## 2022-05-19 MED ORDER — AMLODIPINE BESYLATE 2.5 MG PO TABS: 2.5000 mg | ORAL_TABLET | Freq: Every day | ORAL | 1 refills | Status: DC

## 2022-05-19 MED ORDER — HYDROCORT-PRAMOXINE (PERIANAL) 2.5-1 % EX CREA
1.0000 | TOPICAL_CREAM | Freq: Three times a day (TID) | CUTANEOUS | 0 refills | Status: DC
  Filled 2022-05-19 – 2022-07-07 (×2): qty 30, 10d supply, fill #0

## 2022-05-19 NOTE — Progress Notes (Signed)
   S:     No chief complaint on file.  Candice [name] is a 46 y.o. female who presents for hypertension evaluation, education, and management.  PMH is significant for elevated BP without the dx of HTN, preDM, DJD of lumbar spine, and allergic rhinitis. Patient was referred and last seen by Primary Care Provider, Bertram Denver on 02/24/2022. I saw her on 03/31/2022 and she was amenable to starting amlodipine.   Today, patient arrives in good spirits and presents without assistance. Denies dizziness, headache, blurred vision, swelling.   Family/Social history:  Fhx: HTN (father) Tobacco: never smoker  Alcohol: none reported   Medication adherence reported.   Current antihypertensives include: amlodipine 2.5 mg daily   Antihypertensives tried in the past include: none  Reported home BP readings: none  Patient reported dietary habits: -Sodium: compliant with salt restriction.  -Caffeine: tells me that she drinks a lot of caffeine. Pt tells me that she drinks soda throughout the day.   Patient-reported exercise habits: walks.   O:  Vitals:   05/19/22 1632  BP: 124/84  Pulse: (!) 103    Last 3 Office BP readings: BP Readings from Last 3 Encounters:  05/19/22 124/84  03/31/22 (!) 143/90  02/24/22 (!) 144/92    BMET    Component Value Date/Time   NA 141 02/24/2022 1346   K 4.0 02/24/2022 1346   CL 100 02/24/2022 1346   CO2 24 02/24/2022 1346   GLUCOSE 104 (H) 02/24/2022 1346   GLUCOSE 98 08/12/2021 1511   BUN 9 02/24/2022 1346   CREATININE 0.61 02/24/2022 1346   CREATININE 0.69 06/20/2021 0959   CREATININE 0.59 04/07/2015 1201   CALCIUM 9.6 02/24/2022 1346   GFRNONAA >60 08/12/2021 1511   GFRNONAA >60 06/20/2021 0959   GFRNONAA >89 10/01/2014 1026   GFRAA >60 06/03/2019 0105   GFRAA >89 10/01/2014 1026    Renal function: CrCl cannot be calculated (Patient's most recent lab result is older than the maximum 21 days allowed.).  Clinical ASCVD: No  The 10-year  ASCVD risk score (Arnett DK, et al., 2019) is: 3.1%   Values used to calculate the score:     Age: 77 years     Sex: Female     Is Non-Hispanic African American: Yes     Diabetic: Yes     Tobacco smoker: No     Systolic Blood Pressure: 124 mmHg     Is BP treated: Yes     HDL Cholesterol: 64 mg/dL     Total Cholesterol: 214 mg/dL    A/P: Hypertension newly diagnosed. She is adherent to amlodipine 2.5 mg daily. BP goal < 130/80 mmHg.  -Continue amlodipine 2.5 mg daily.  -Patient educated on purpose, proper use, and potential adverse effects of amlodipine.  -Counseled on lifestyle modifications for blood pressure control including reduced dietary sodium, increased exercise, adequate sleep. -Encouraged patient to check BP at home and bring log of readings to next visit. Counseled on proper use of home BP cuff.   Results reviewed and written information provided.    Written patient instructions provided. Patient verbalized understanding of treatment plan.  Total time in face to face counseling 30 minutes.    Follow-up:  Pharmacist in 1 month.  Butch Penny, PharmD, Patsy Baltimore, CPP Clinical Pharmacist [facility] [phone]

## 2022-06-05 ENCOUNTER — Encounter: Payer: Self-pay | Admitting: Physical Medicine & Rehabilitation

## 2022-06-05 ENCOUNTER — Encounter
Payer: Commercial Managed Care - HMO | Attending: Physical Medicine & Rehabilitation | Admitting: Physical Medicine & Rehabilitation

## 2022-06-05 VITALS — BP 139/94 | HR 104 | Ht 65.0 in | Wt 226.8 lb

## 2022-06-05 DIAGNOSIS — F0630 Mood disorder due to known physiological condition, unspecified: Secondary | ICD-10-CM | POA: Diagnosis present

## 2022-06-05 DIAGNOSIS — M545 Low back pain, unspecified: Secondary | ICD-10-CM | POA: Diagnosis present

## 2022-06-05 DIAGNOSIS — M797 Fibromyalgia: Secondary | ICD-10-CM | POA: Insufficient documentation

## 2022-06-05 MED ORDER — DULOXETINE HCL 60 MG PO CPEP: 60.0000 mg | ORAL_CAPSULE | Freq: Every day | ORAL | 2 refills | Status: DC

## 2022-06-05 MED ORDER — CYCLOBENZAPRINE HCL 5 MG PO TABS: 5.0000 mg | ORAL_TABLET | Freq: Every day | ORAL | 0 refills | Status: AC

## 2022-06-05 MED ORDER — GABAPENTIN 100 MG PO CAPS: 100.0000 mg | ORAL_CAPSULE | Freq: Three times a day (TID) | ORAL | 3 refills | Status: DC

## 2022-06-05 MED ORDER — DICLOFENAC SODIUM 1 % EX GEL: 4.0000 g | Freq: Four times a day (QID) | CUTANEOUS | Status: DC

## 2022-06-05 NOTE — Progress Notes (Unsigned)
Subjective:    Patient ID: Candice [name], female    DOB: [birth year], 46 y.o.   MRN: [id number]  HPI  Candice [name] is a 46 yo female with PMH of lumbar back pain with radiculopathy here for neck and shoulder pain.  She reports neck and back pain since 2015. Pain is tingling, stabbing and shooting. She also has pain in her legs worse on the left. Pain radiates to all her fingers and toes.  Bending worsens the pain.  Inactivity and sitting also worsens the pain.  She does not exercise and is not very active a this time.  She had PT with minimal benefit. She reports general fatigue and sleep disturbance,often hard to fall asleep. Prior ESI x2 reports that helped her leg pain.  She uses tylenol in the past with minimal benefit and stopped due to concerns it would damage her liver if used chronically.  Flexeril and duloxetine help improve her pain.  No bowel or bladder changes.   Interval History Candice [name] is here for her chronic fibromyalgia.  She is here with an interpreter today.  She reports her pain is doing much better overall with the duloxetine and Flexeril.  She is trying to be more active by doing activities around the house.  She feels like she is able to do more activities around the house than she used to previously.  She does continue to have pain in her back and left lower extremity.  She uses Flexeril when her pain is severe sometimes 2 or 3 times a day.  She has not had any side effects with medications.     Pain Inventory Average Pain 2 Pain Right Now 1 My pain is intermittent and sharp  In the last 24 hours, has pain interfered with the following? General activity 8 Relation with others 7 Enjoyment of life 8 What TIME of day is your pain at its worst? varies Sleep (in general) Poor  Pain is worse with: bending and sitting Pain improves with: rest and medication Relief from Meds: 4  Family History  Problem Relation Age of Onset   Hypertension Father    Social  History   Socioeconomic History   Marital status: Married    Spouse name: Not on file   Number of children: Not on file   Years of education: Not on file   Highest education level: Not on file  Occupational History   Not on file  Tobacco Use   Smoking status: Never   Smokeless tobacco: Never  Vaping Use   Vaping Use: Never used  Substance and Sexual Activity   Alcohol use: No   Drug use: No   Sexual activity: Yes  Other Topics Concern   Not on file  Social History Narrative   Not on file   Social Determinants of Health   Financial Resource Strain: Not on file  Food Insecurity: Not on file  Transportation Needs: Not on file  Physical Activity: Not on file  Stress: Not on file  Social Connections: Not on file   Past Surgical History:  Procedure Laterality Date   CIRCUMCISION  as a young girl    Type III femake circumcision    DILATION AND CURETTAGE OF UTERUS  2006   HEMORRHOID SURGERY  2015   HEMORRHOID SURGERY     in [place]   Past Surgical History:  Procedure Laterality Date   CIRCUMCISION  as a young girl    Type III femake circumcision  DILATION AND CURETTAGE OF UTERUS  2006   HEMORRHOID SURGERY  2015   HEMORRHOID SURGERY     in [place]   Past Medical History:  Diagnosis Date   Irritable bowel syndrome (IBS)    Prediabetes    Ht '5\' 5"'  (1.651 m)   Wt 226 lb 12.8 oz (102.9 kg)   BMI 37.74 kg/m   Opioid Risk Score:   Fall Risk Score:  `1  Depression screen [facility] 2/9     06/05/2022   11:33 AM 02/17/2022    1:17 PM 01/17/2022   10:21 AM 03/16/2021    3:02 PM 08/18/2020   10:03 AM 02/27/2017    3:48 PM 06/05/2016   11:58 AM  Depression screen PHQ 2/9  Decreased Interest 0 0 0 0 0 0 0  Down, Depressed, Hopeless 0 0 0 0 0 0 0  PHQ - 2 Score 0 0 0 0 0 0 0  Altered sleeping   1 0 0 0   Tired, decreased energy   0 0 0 0   Change in appetite   0 0 0 0   Feeling bad or failure about yourself    0 0 0 0   Trouble concentrating   1 0 0 0   Moving slowly or  fidgety/restless   0 0 0 0   Suicidal thoughts   0 0 0 0   PHQ-9 Score   2 0 0 0       Review of Systems  Musculoskeletal:  Positive for back pain.       Chest pain Left hip pain  All other systems reviewed and are negative.      Objective:   Physical Exam  Gen: no distress, normal appearing, very pleasant HEENT: oral mucosa pink and moist, NCAT Chest: normal effort, normal rate of breathing Abd: soft, non-distended Ext: no edema Psych: Very pleasant, normal affect Skin: intact, warm and dry Neuro: Follows commands, speech fluent, she has an interpreter CN 2-12 grossly  intact Musculoskeletal:  Strength 5/5 in b/l UE 5/5 in b/l LE No joint swelling noted Decreased C spine movement in all direction Sensation intact to LT in all 4 extremities, both sides of hands and feet Tone normal b/l Tender to palpation left QL, lumbar paraspinals, left knee.  She is not very tender in her shoulders elbows wrists ankles.     Assessment & Plan:   Fibromyalgia, appears to be improved overall -MRI Cspine does not indicate likely spinal cause of overall pain -Advised to continue low intensity progressive activity/exercise  -Continue duloxetine 74m daily -Patient continues to have limiting pain we will trial gabapentin 100 mg 3 times daily -Continue Flexeril as needed however we will try to limit use of this medication -Discussed consideration of yoga or tai chi   Lumbar back pain with radiculopathy, Lumbar disc degeneration -S/P ESI in past by Dr. NErnestina Patcheswith improvement -May be contributing component to her overall pain -Duloxetine may be providing benefit for this as well, gabapentin may also help with this.  Mood disorder -No SI or HI -Suspect duloxetine is helping her mood  Knee pain -We will try Voltaren gel

## 2022-06-07 ENCOUNTER — Encounter: Payer: Self-pay | Admitting: Physical Medicine & Rehabilitation

## 2022-06-27 ENCOUNTER — Encounter: Payer: Self-pay | Admitting: Internal Medicine

## 2022-06-27 ENCOUNTER — Ambulatory Visit: Payer: 59 | Admitting: Nurse Practitioner

## 2022-07-08 ENCOUNTER — Other Ambulatory Visit (HOSPITAL_COMMUNITY): Payer: Self-pay

## 2022-07-12 ENCOUNTER — Other Ambulatory Visit (HOSPITAL_COMMUNITY): Payer: Self-pay

## 2022-07-12 ENCOUNTER — Encounter (HOSPITAL_COMMUNITY): Payer: Self-pay | Admitting: Pharmacist

## 2022-07-21 ENCOUNTER — Encounter: Payer: Self-pay | Admitting: Internal Medicine

## 2022-07-21 ENCOUNTER — Ambulatory Visit: Payer: Commercial Managed Care - HMO | Attending: Nurse Practitioner | Admitting: Internal Medicine

## 2022-07-21 ENCOUNTER — Other Ambulatory Visit (HOSPITAL_COMMUNITY): Payer: Self-pay

## 2022-07-21 VITALS — BP 120/70 | HR 105 | Temp 98.0°F | Ht 65.0 in | Wt 236.6 lb

## 2022-07-21 DIAGNOSIS — N898 Other specified noninflammatory disorders of vagina: Secondary | ICD-10-CM | POA: Diagnosis not present

## 2022-07-21 DIAGNOSIS — E1159 Type 2 diabetes mellitus with other circulatory complications: Secondary | ICD-10-CM

## 2022-07-21 DIAGNOSIS — E669 Obesity, unspecified: Secondary | ICD-10-CM

## 2022-07-21 DIAGNOSIS — I152 Hypertension secondary to endocrine disorders: Secondary | ICD-10-CM

## 2022-07-21 DIAGNOSIS — E1169 Type 2 diabetes mellitus with other specified complication: Secondary | ICD-10-CM | POA: Diagnosis not present

## 2022-07-21 LAB — POCT GLYCOSYLATED HEMOGLOBIN (HGB A1C): HbA1c, POC (controlled diabetic range): 6.8 % (ref 0.0–7.0)

## 2022-07-21 LAB — GLUCOSE, POCT (MANUAL RESULT ENTRY): POC Glucose: 174 mg/dl — AB (ref 70–99)

## 2022-07-21 MED ORDER — TRULICITY 0.75 MG/0.5ML ~~LOC~~ SOAJ: 0.7500 mg | SUBCUTANEOUS | 1 refills | Status: DC

## 2022-07-21 MED ORDER — ONETOUCH VERIO VI STRP: ORAL_STRIP | 12 refills | Status: DC

## 2022-07-21 MED ORDER — MICONAZOLE NITRATE 2 % VA CREA: 1.0000 | TOPICAL_CREAM | Freq: Every day | VAGINAL | 1 refills | Status: DC

## 2022-07-21 NOTE — Progress Notes (Signed)
Patient ID: Candice [name], female    DOB: [birth year]  MRN: [id number]  CC: Diabetes and Hypertension   Subjective: Candice [name] is a 46 y.o. female who presents for several concerns.  PCP is Geryl Rankins Her concerns today include:  Patient with history of IBS, prediabetes, fibromyalgia, LBP, OSA  PreDM:  Results for orders placed or performed in visit on 07/21/22  POCT glycosylated hemoglobin (Hb A1C)  Result Value Ref Range   Hemoglobin A1C     HbA1c POC (<> result, manual entry)     HbA1c, POC (prediabetic range)     HbA1c, POC (controlled diabetic range) 6.8 0.0 - 7.0 %    A1C today 6.8 -was on Metformin before.  Stopped 2-3 mths; reports PCP told her to d/c.  -wanting to get on Ozempic or Mounjoro to help with wgh loss -stopped eating sweets, drinks juices.   -exercise:  does work out video daily for 30 mins BP noted to be elev today and on past several encounters with the healthy system.  On Norvasc 2.5 mg; taking consistently. Took already this a.m. No device to check BP.  Tried purchasing one at [employer] that did not work Marketing executive to limit salt in foods.  Wants vaginal cream for vaginal yeast.   Endorses vaginal itching.  Has used  tube of Vagistat with her.  Reports this was helpful in past.     Patient Active Problem List   Diagnosis Date Noted   Snoring 01/18/2022   Leukocytosis 06/20/2021   Prediabetes 04/23/2018   History of gastroesophageal reflux (GERD) 08/03/2017   History of vitamin D deficiency 62/13/0865   History of Helicobacter pylori infection 08/03/2017   History of urinary tract infection 08/03/2017   Asymptomatic microscopic hematuria 08/03/2017   Chronic abdominal pain 08/03/2017   Left lower quadrant pain 06/05/2016   Hemorrhoid 04/06/2016   Neck pain 10/28/2015   Shoulder pain, bilateral 10/28/2015   Vertigo 07/29/2015   Tachycardia 07/08/2015   Vitamin D deficiency 04/08/2015   Lumbar back pain with radiculopathy affecting left lower  extremity 04/07/2015   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   Chronic pain of multiple joints 01/29/2015   Irregular menses 10/01/2014   Allergic rhinitis 10/01/2014   Chalazion of left upper eyelid 10/01/2014   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 90 tablet 1   DULoxetine (CYMBALTA) 60 MG capsule Take 1 capsule (60 mg total) by mouth daily. 90 capsule 2   fluticasone (FLONASE) 50 MCG/ACT nasal spray Place 2 sprays into both nostrils daily. 16 g 6   gabapentin (NEURONTIN) 100 MG capsule Take 1 capsule (100 mg total) by mouth 3 (three) times daily. 90 capsule 3   hydrocortisone-pramoxine (ANALPRAM-HC) 2.5-1 % rectal cream Insert 1 application rectally 3 (three) times daily. 60 g 0   Blood Glucose Monitoring Suppl (ONETOUCH VERIO REFLECT) w/Device KIT use daily as directed (Patient not taking: Reported on 07/21/2022) 1 kit 0   ibuprofen (ADVIL) 600 MG tablet Take 1 tablet (600 mg total) by mouth every 6 (six) hours as needed. (Patient not taking: Reported on 06/05/2022) 30 tablet 0   loratadine (CLARITIN) 10 MG tablet Take 1 tablet (10 mg total) by mouth daily. (Patient not taking: Reported on 06/05/2022) 90 tablet 1   Current Facility-Administered Medications on File Prior to  Visit  Medication Dose Route Frequency Provider Last Rate Last Admin   diclofenac Sodium (VOLTAREN) 1 % topical gel 4 g  4 g Topical QID Jennye Boroughs, MD        No Known Allergies  Social History   Socioeconomic History   Marital status: Married    Spouse name: Not on file   Number of children: Not on file   Years of education: Not on file   Highest education level: Not on file  Occupational History   Not on file  Tobacco Use   Smoking status: Never   Smokeless tobacco: Never  Vaping Use   Vaping Use: Never used   Substance and Sexual Activity   Alcohol use: No   Drug use: No   Sexual activity: Yes  Other Topics Concern   Not on file  Social History Narrative   Not on file   Social Determinants of Health   Financial Resource Strain: Not on file  Food Insecurity: Not on file  Transportation Needs: Not on file  Physical Activity: Not on file  Stress: Not on file  Social Connections: Not on file  Intimate Partner Violence: Not on file    Family History  Problem Relation Age of Onset   Hypertension Father     Past Surgical History:  Procedure Laterality Date   CIRCUMCISION  as a young girl    Type III femake circumcision    DILATION AND CURETTAGE OF UTERUS  2006   HEMORRHOID SURGERY  2015   HEMORRHOID SURGERY     in [place]    ROS: Review of Systems Negative except as stated above  PHYSICAL EXAM: BP 120/70   Pulse (!) 105   Temp 98 F (36.7 C) (Temporal)   Ht _0  (1.651 m)   Wt 236 lb 9.6 oz (107.3 kg)   SpO2 98%   BMI 39.37 kg/m   Physical Exam  General appearance - alert, well appearing, obese middle-age female and in no distress Mental status - normal mood, behavior, speech, dress, motor activity, and thought processes Neck - supple, no significant adenopathy Chest - clear to auscultation, no wheezes, rales or rhonchi, symmetric air entry Heart -mild tachycardia but regular rate and rhythm Extremities - peripheral pulses normal, no pedal edema, no clubbing or cyanosis      Latest Ref Rng & Units 02/24/2022    1:46 PM 09/30/2021   11:23 AM 08/12/2021    3:11 PM  CMP  Glucose 70 - 99 mg/dL 104  118  98   BUN 6 - 24 mg/dL _1 Creatinine 0.57 - 1.00 mg/dL 0.61  0.70  0.78   Sodium 134 - 144 mmol/L 141  141  138   Potassium 3.5 - 5.2 mmol/L 4.0  4.2  3.9   Chloride 96 - 106 mmol/L 100  101  106   CO2 20 - 29 mmol/L _2 Calcium 8.7 - 10.2 mg/dL 9.6  9.5  8.9   Total Protein 6.0 - 8.5 g/dL 7.6  6.8    Total Bilirubin 0.0 - 1.2 mg/dL <0.2  <0.2     Alkaline Phos 44 - 121 IU/L 140  125    AST 0 - 40 IU/L 19  17    ALT 0 - 32 IU/L 17  13     Lipid Panel     Component Value Date/Time   CHOL 214 (H) 09/30/2021 1123   TRIG  114 09/30/2021 1123   HDL 64 09/30/2021 1123   CHOLHDL 3.3 09/30/2021 1123   LDLCALC 130 (H) 09/30/2021 1123    CBC    Component Value Date/Time   WBC 8.8 09/30/2021 1123   WBC 12.1 (H) 08/12/2021 1511   RBC 4.55 09/30/2021 1123   RBC 3.92 08/12/2021 1511   HGB 11.2 09/30/2021 1123   HCT 36.6 09/30/2021 1123   PLT 394 09/30/2021 1123   MCV 80 09/30/2021 1123   MCH 24.6 (L) 09/30/2021 1123   MCH 26.5 08/12/2021 1511   MCHC 30.6 (L) 09/30/2021 1123   MCHC 30.9 08/12/2021 1511   RDW 13.0 09/30/2021 1123   LYMPHSABS 2.0 09/30/2021 1123   MONOABS 0.6 08/12/2021 1511   EOSABS 0.2 09/30/2021 1123   BASOSABS 0.0 09/30/2021 1123    ASSESSMENT AND PLAN: 1. Diabetes mellitus type 2 in obese (HCC) Now in the range for diabetes based on A1c. Discussed the importance of healthy eating habits, regular aerobic exercise (at least 150 minutes a week as tolerated) and medication compliance to achieve or maintain control of diabetes. -Discussed putting her on Trulicity (her insurance does not cover Ozempic) to help with diabetes and weight loss.  I went over with her how the medication works and possible adverse reactions including acute pancreatitis and bowel blockage.  Advised to stop the medication and be seen if she develops vomiting with pain in the abdomen.  She was agreeable to trying the medicine.  Clinical pharmacist saw her today to teach Trulicity administration. - POCT glycosylated hemoglobin (Hb A1C) - POCT glucose (manual entry) - Dulaglutide (TRULICITY) 3.70 DU/4.3CV SOPN; Inject 0.75 mg into the skin once a week.  Dispense: 2 mL; Refill: 1 - glucose blood (ONETOUCH VERIO) test strip; Use as instructed  Dispense: 100 each; Refill: 12  2. Hypertension associated with type 2 diabetes mellitus ([place]) Repeat  blood pressure at goal.  Continue low-dose Norvasc  3. Vaginal itching - miconazole (MICONAZOLE 7) 2 % vaginal cream; Place 1 Applicatorful vaginally at bedtime.  Dispense: 45 g; Refill: 1    [employer] Language interpreter used during this encounter. #[id number] Dyer  Patient was given the opportunity to ask questions.  Patient verbalized understanding of the plan and was able to repeat key elements of the plan.   This documentation was completed using Radio producer.  Any transcriptional errors are unintentional.  Orders Placed This Encounter  Procedures   POCT glycosylated hemoglobin (Hb A1C)   POCT glucose (manual entry)     Requested Prescriptions   Signed Prescriptions Disp Refills   Dulaglutide (TRULICITY) 7.54 HK/0.6VP SOPN 2 mL 1    Sig: Inject 0.75 mg into the skin once a week.   miconazole (MICONAZOLE 7) 2 % vaginal cream 45 g 1    Sig: Place 1 Applicatorful vaginally at bedtime.   glucose blood (ONETOUCH VERIO) test strip 100 each 12    Sig: Use as instructed    Return in about 6 weeks (around 09/01/2022) for Give 6 wks f/u with PCP Geryl Rankins.  Karle Plumber, MD, FACP

## 2022-08-02 ENCOUNTER — Ambulatory Visit: Payer: Commercial Managed Care - HMO | Admitting: Internal Medicine

## 2022-08-02 ENCOUNTER — Encounter: Payer: Self-pay | Admitting: Internal Medicine

## 2022-08-02 DIAGNOSIS — G8929 Other chronic pain: Secondary | ICD-10-CM

## 2022-08-02 DIAGNOSIS — R109 Unspecified abdominal pain: Secondary | ICD-10-CM | POA: Diagnosis not present

## 2022-08-02 DIAGNOSIS — K5900 Constipation, unspecified: Secondary | ICD-10-CM | POA: Diagnosis not present

## 2022-08-02 DIAGNOSIS — R748 Abnormal levels of other serum enzymes: Secondary | ICD-10-CM

## 2022-08-02 DIAGNOSIS — R1310 Dysphagia, unspecified: Secondary | ICD-10-CM

## 2022-08-02 DIAGNOSIS — K625 Hemorrhage of anus and rectum: Secondary | ICD-10-CM

## 2022-08-02 NOTE — Patient Instructions (Addendum)
If you are age 44 or younger, your body mass index should be between 19-25. Your Body mass index is 38.94 kg/m. If this is out of the aformentioned range listed, please consider follow up with your Primary Care Provider.  ________________________________________________________  The [place] [facility] providers would like to encourage you to use [facility] to communicate with providers for non-urgent requests or questions.  Due to long hold times on the telephone, sending your provider a message by [facility] may be a faster and more efficient way to get a response.  Please allow 48 business hours for a response.  Please remember that this is for non-urgent requests.  _______________________________________________________  Drink 8 cups of water a day and walk 30 minutes a day.  Please purchase the following medications over the counter and take as directed: Fiber supplement such as Benefiber- use as directed daily Miralax: Take as  directed up to 3 times a day to achieve regular bowel movements  You are scheduled to follow up on 10-03-22 at 11:10am  Thank you for entrusting me with your care and choosing [facility].  Dr Leonides Schanz

## 2022-08-02 NOTE — Progress Notes (Signed)
Chief Complaint: Dysphagia, ab pain, rectal bleeding  HPI : 46 year old female with history of IBS, hemorrhoids s/p hemorrhoidectomy in 2014, and HTN presents with dysphagia, ab pain, and rectal bleeding  Patient has had bloating and gas for years. She is also constipated. On average she has four BMs per day, but the BMs are only small amounts. She has to strain hard in order to help induce a BM. She will have some rectal bleeding when she is straining. When she has abdominal distension, she has ab pain. The pain is located all over her abdomen. Passing flatus seems to help with her ab pain. In 2014 she had a hemorrhoidectomy. After her hemorrhoidectomy, she developed fecal urgency. She has never had a colonoscopy in the past. She has had dysphagia to liquids for a long time. Denies dysphagia to solids. She does not have any chest burning or regurgitation. Mother had GERD. Denies other family history of GI issues.  Wt Readings from Last 3 Encounters:  08/02/22 234 lb (106.1 kg)  07/21/22 236 lb 9.6 oz (107.3 kg)  06/05/22 226 lb 12.8 oz (102.9 kg)   Past Medical History:  Diagnosis Date   Hypertension    Irritable bowel syndrome (IBS)    Prediabetes    Past Surgical History:  Procedure Laterality Date   CIRCUMCISION  as a young girl    Type III femake circumcision    DILATION AND CURETTAGE OF UTERUS  2006   HEMORRHOID SURGERY  2015   HEMORRHOID SURGERY     in [place]   Family History  Problem Relation Age of Onset   Hypertension Father    High blood pressure Maternal Grandmother    Social History   Tobacco Use   Smoking status: Never   Smokeless tobacco: Never  Vaping Use   Vaping Use: Never used  Substance Use Topics   Alcohol use: No   Drug use: No   Current Outpatient Medications  Medication Sig Dispense Refill   amLODipine (NORVASC) 2.5 MG tablet Take 1 tablet (2.5 mg total) by mouth daily. 90 tablet 1   Blood Glucose Monitoring Suppl (ONETOUCH VERIO REFLECT)  w/Device KIT use daily as directed 1 kit 0   Dulaglutide (TRULICITY) 3.54 SF/6.8LE SOPN Inject 0.75 mg into the skin once a week. 2 mL 1   DULoxetine (CYMBALTA) 60 MG capsule Take 1 capsule (60 mg total) by mouth daily. 90 capsule 2   fluticasone (FLONASE) 50 MCG/ACT nasal spray Place 2 sprays into both nostrils daily. 16 g 6   gabapentin (NEURONTIN) 100 MG capsule Take 1 capsule (100 mg total) by mouth 3 (three) times daily. 90 capsule 3   glucose blood (ONETOUCH VERIO) test strip Use as instructed 100 each 12   hydrocortisone-pramoxine (ANALPRAM-HC) 2.5-1 % rectal cream Insert 1 application rectally 3 (three) times daily. 60 g 0   ibuprofen (ADVIL) 600 MG tablet Take 1 tablet (600 mg total) by mouth every 6 (six) hours as needed. 30 tablet 0   loratadine (CLARITIN) 10 MG tablet Take 1 tablet (10 mg total) by mouth daily. 90 tablet 1   miconazole (MICONAZOLE 7) 2 % vaginal cream Place 1 Applicatorful vaginally at bedtime. 45 g 1   Current Facility-Administered Medications  Medication Dose Route Frequency Provider Last Rate Last Admin   diclofenac Sodium (VOLTAREN) 1 % topical gel 4 g  4 g Topical QID Jennye Boroughs, MD       No Known Allergies  Review of Systems: All systems  reviewed and negative except where noted in HPI.   Physical Exam: BP 120/70 (BP Location: Left Arm, Patient Position: Sitting, Cuff Size: Large)   Pulse 95   Ht _0  (1.651 m)   Wt 234 lb (106.1 kg)   SpO2 97%   BMI 38.94 kg/m  Constitutional: Pleasant,well-developed, female in no acute distress. HEENT: Normocephalic and atraumatic. Conjunctivae are normal. No scleral icterus. Cardiovascular: Normal rate, regular rhythm.  Pulmonary/chest: Effort normal and breath sounds normal. No wheezing, rales or rhonchi. Abdominal: Soft, nondistended, nontender. Bowel sounds active throughout. There are no masses palpable. No hepatomegaly. Extremities: No edema Neurological: Alert and oriented to person place and  time. Skin: Skin is warm and dry. No rashes noted. Psychiatric: Normal mood and affect. Behavior is normal.  Labs 09/2021: CMP with mildly elevated alk phos of 125.  Labs 02/2022: CMP with mildly elevated alk phos of 140.  CT renal stone study 09/21/17: IMPRESSION: 1. No renal stone or obstructive uropathy. No acute abnormality or explanation for flank pain. 2. Incidental benign left adrenal adenoma. No dedicated imaging follow-up is needed.  CT A/P w/contrast 06/03/19: IMPRESSION: No acute abnormality or finding to explain the patient's symptoms. No change in a benign left adrenal adenoma. Degenerative disc disease lower lumbar spine.  ASSESSMENT AND PLAN: Dysphagia Abdominal pain Bloating Constipation Elevated alk phos Rectal bleeding Patient presents with dysphagia, ab pain, and constipation that have been present for years. I do suspect that constipation could be contributing to the patient's sensation of bloating and gas. Will institute some conservative therapies for constipation to see if this helps with her symptoms. If she does not respond adequately, the can consider prescription medications to help with constipation. Her prior hemorrhoid surgery may have caused some pelvic floor dysfunction. I offered the patient a colonoscopy for further evaluation of her rectal bleeding but the patient declined at this time. For her dysphagia, I did offer her an EGD, but she would like to hold off for now and see an ENT physician first before making a decision about the EGD. For her elevated alk phos, we could consider further work up but the patient was not interested in any laboratory evaluation or imaging at this time. - Drink 8 cups of water per day, walk 30 minutes per day, daily fiber supplement - Start daily Miralax - Patient declined labs for now - Patient declined RUQ U/S for now - Patient declined EGD/colonoscopy for now - Patient has an upcoming appt with ENT in 08/2022 - RTC 2  months  Christia Reading, MD  I spent 63 minutes of time, including in depth chart review, independent review of results as outlined above, communicating results with the patient directly, face-to-face time with the patient, coordinating care, ordering studies and medications as appropriate, and documentation.

## 2022-08-03 ENCOUNTER — Telehealth: Payer: Self-pay | Admitting: Nurse Practitioner

## 2022-08-03 DIAGNOSIS — E1169 Type 2 diabetes mellitus with other specified complication: Secondary | ICD-10-CM

## 2022-08-03 MED ORDER — ONETOUCH VERIO VI STRP: ORAL_STRIP | 2 refills | Status: AC

## 2022-08-03 NOTE — Telephone Encounter (Signed)
Rx sent with updated instructions. ?

## 2022-08-03 NOTE — Telephone Encounter (Signed)
Medication Refill - Medication: glucose blood (ONETOUCH VERIO) test strip   Pt is calling to follow up on her glucose blood (ONETOUCH VERIO) test strip refill. Pt stated that the pharmacy refuses to give her a refill. I called the pharmacy and was advised that instructions state to use as instructed, and they need to know how often she is testing to fill correctly. Pt has no more test strips.  Pt stated she is testing 3-4 times a day.  Please advise.   Has the patient contacted their pharmacy? Yes.    (Agent: If yes, when and what did the pharmacy advise?)  Preferred Pharmacy (with phone number or street name):  [id number] [place], [place] - [address] AT [facility] [address]  [address] [place] [address]  Phone: [phone] Fax: [phone]  Hours: Not open 24 hours   Has the patient been seen for an appointment in the last year OR does the patient have an upcoming appointment? Yes.    Agent: Please be advised that RX refills may take up to 3 business days. We ask that you follow-up with your pharmacy.

## 2022-08-03 NOTE — Telephone Encounter (Signed)
Patient is calling to request direction changes on her test strips. She is unable to get RF- she is testing 3-4 times/day and is running out of strips if only given #100 per month. Please review for changes or patient recommendations

## 2022-08-25 ENCOUNTER — Encounter
Payer: Commercial Managed Care - HMO | Attending: Physical Medicine & Rehabilitation | Admitting: Physical Medicine & Rehabilitation

## 2022-08-25 ENCOUNTER — Telehealth: Payer: Self-pay | Admitting: Orthopedic Surgery

## 2022-08-25 ENCOUNTER — Encounter: Payer: Self-pay | Admitting: Physical Medicine & Rehabilitation

## 2022-08-25 VITALS — BP 122/83 | HR 94 | Ht 65.0 in | Wt 231.2 lb

## 2022-08-25 DIAGNOSIS — F0630 Mood disorder due to known physiological condition, unspecified: Secondary | ICD-10-CM | POA: Diagnosis present

## 2022-08-25 DIAGNOSIS — M797 Fibromyalgia: Secondary | ICD-10-CM | POA: Diagnosis present

## 2022-08-25 DIAGNOSIS — M545 Low back pain, unspecified: Secondary | ICD-10-CM | POA: Insufficient documentation

## 2022-08-25 DIAGNOSIS — G8929 Other chronic pain: Secondary | ICD-10-CM | POA: Insufficient documentation

## 2022-08-25 DIAGNOSIS — M25561 Pain in right knee: Secondary | ICD-10-CM | POA: Insufficient documentation

## 2022-08-25 DIAGNOSIS — M25562 Pain in left knee: Secondary | ICD-10-CM | POA: Insufficient documentation

## 2022-08-25 MED ORDER — GABAPENTIN 100 MG PO CAPS: 200.0000 mg | ORAL_CAPSULE | Freq: Three times a day (TID) | ORAL | 3 refills | Status: DC

## 2022-08-25 MED ORDER — DICLOFENAC SODIUM 1 % EX GEL: 4.0000 g | Freq: Four times a day (QID) | CUTANEOUS | 4 refills | Status: DC

## 2022-08-25 NOTE — Progress Notes (Unsigned)
Subjective:    Patient ID: Candice [name], female    DOB: Feb 13, 1976, 46 y.o.   MRN: [id number]  HPI Pain improves with activity Cushion for knees when praying  Dry mouth- duloxetine , hold  Knee xrays  Pain Inventory Average Pain 8 Pain Right Now 8 My pain is constant and sharp  In the last 24 hours, has pain interfered with the following? General activity 4 Relation with others 0 Enjoyment of life 0 What TIME of day is your pain at its worst? morning  Sleep (in general) Good  Pain is worse with: bending Pain improves with: therapy/exercise, medication, and injections Relief from Meds: 8  Family History  Problem Relation Age of Onset   Hypertension Father    High blood pressure Maternal Grandmother    Social History   Socioeconomic History   Marital status: Married    Spouse name: Not on file   Number of children: Not on file   Years of education: Not on file   Highest education level: Not on file  Occupational History   Not on file  Tobacco Use   Smoking status: Never   Smokeless tobacco: Never  Vaping Use   Vaping Use: Never used  Substance and Sexual Activity   Alcohol use: No   Drug use: No   Sexual activity: Yes  Other Topics Concern   Not on file  Social History Narrative   Not on file   Social Determinants of Health   Financial Resource Strain: Not on file  Food Insecurity: Not on file  Transportation Needs: Not on file  Physical Activity: Not on file  Stress: Not on file  Social Connections: Not on file   Past Surgical History:  Procedure Laterality Date   CIRCUMCISION  as a young girl    Type III femake circumcision    DILATION AND CURETTAGE OF UTERUS  2006   HEMORRHOID SURGERY  2015   HEMORRHOID SURGERY     in [place]   Past Surgical History:  Procedure Laterality Date   CIRCUMCISION  as a young girl    Type III femake circumcision    DILATION AND CURETTAGE OF UTERUS  2006   [place]  2015   HEMORRHOID SURGERY     in  [place]   Past Medical History:  Diagnosis Date   Hypertension    Irritable bowel syndrome (IBS)    Prediabetes    BP 122/83   Pulse 94   Ht 5' 5" (1.651 m)   Wt 231 lb 3.2 oz (104.9 kg)   SpO2 96%   BMI 38.47 kg/m   Opioid Risk Score:   Fall Risk Score:  `1  Depression screen [facility] 2/9     07/21/2022    9:16 AM 06/05/2022   11:33 AM 02/17/2022    1:17 PM 01/17/2022   10:21 AM 03/16/2021    3:02 PM 08/18/2020   10:03 AM 02/27/2017    3:48 PM  Depression screen PHQ 2/9  Decreased Interest 0 0 0 0 0 0 0  Down, Depressed, Hopeless 0 0 0 0 0 0 0  PHQ - 2 Score 0 0 0 0 0 0 0  Altered sleeping 0   1 0 0 0  Tired, decreased energy 0   0 0 0 0  Change in appetite 0   0 0 0 0  Feeling bad or failure about yourself  0   0 0 0 0  Trouble concentrating 0   1  0 0 0  Moving slowly or fidgety/restless 0   0 0 0 0  Suicidal thoughts 0   0 0 0 0  PHQ-9 Score 0   2 0 0 0     Review of Systems  Musculoskeletal:  Positive for back pain.       Left knee pain Left calf pain  All other systems reviewed and are negative.      Objective:   Physical Exam   Gen: no distress, normal appearing, very pleasant HEENT: oral mucosa pink and moist, NCAT Chest: normal effort, normal rate of breathing Abd: soft, non-distended Ext: no edema Psych: Very pleasant, normal affect Skin: intact, warm and dry Neuro: Follows commands, speech fluent, she has an interpreter CN 2-12 grossly  intact Musculoskeletal:  Strength 5/5 in b/l UE 5/5 in b/l LE No joint swelling noted Decreased C spine movement in all direction Sensation intact to LT in all 4 extremities, both sides of hands and feet Tone normal b/l Tender points b/l lats,  bilateral knee joint line and around patella .  She is not very tender in her shoulders elbows wrists ankles.       Assessment & Plan:  Fibromyalgia, appears to be improved overall -MRI Cspine does not indicate likely spinal cause of overall pain -Advised to continue low  intensity progressive activity/exercise  -Continue duloxetine 107m daily -Increase gabapentin to 200 mg 3 times daily -Continue Flexeril as needed however we will try to limit use of this medication -Discussed consideration of yoga or tai chi   Lumbar back pain with radiculopathy, Lumbar disc degeneration -S/P ESI in past by Dr. NErnestina Patcheswith improvement -May be contributing component to her overall pain -advised to try theracane for tender points around her back  Mood disorder -No SI or HI -Suspect duloxetine is helping her mood   Knee pain -Order  Voltaren gel -b/l knee xrays ordered

## 2022-08-25 NOTE — Telephone Encounter (Signed)
Pt came into office requesting for a letter that Dr. Otelia Sergeant gave her to be updated to the current year... Pt stated that the letter is for her apartment complex... Letter is at front desk... Pt requesting callback when letter is done.Marland KitchenMarland Kitchen

## 2022-08-29 ENCOUNTER — Encounter: Payer: Self-pay | Admitting: Radiology

## 2022-08-29 NOTE — Telephone Encounter (Signed)
Dr. Christell Constant ok'd letter, I called and advised patient that it was ready at the front desk for pick up

## 2022-08-31 ENCOUNTER — Ambulatory Visit
Admission: RE | Admit: 2022-08-31 | Discharge: 2022-08-31 | Disposition: A | Discharge status: Home / Self Care | Payer: Commercial Managed Care - HMO | Source: Ambulatory Visit | Attending: Physical Medicine & Rehabilitation | Admitting: Physical Medicine & Rehabilitation

## 2022-08-31 ENCOUNTER — Encounter: Payer: Commercial Managed Care - HMO | Admitting: Physical Medicine & Rehabilitation

## 2022-08-31 DIAGNOSIS — G8929 Other chronic pain: Secondary | ICD-10-CM

## 2022-09-01 ENCOUNTER — Ambulatory Visit: Payer: Commercial Managed Care - HMO | Admitting: Nurse Practitioner

## 2022-09-05 NOTE — Progress Notes (Unsigned)
   Established Patient Office Visit  Subjective   Patient ID: Candice [name], female    DOB: [birth year]  Age: 46 y.o. MRN: [id number]  No chief complaint on file.   46 y.o.Candice [name] PCP BP check Med RF    {History (Optional):23778}  ROS    Objective:     There were no vitals taken for this visit. {Vitals History (Optional):23777}  Physical Exam   No results found for any visits on 09/06/22.  {Labs (Optional):23779}  The 10-year ASCVD risk score (Arnett DK, et al., 2019) is: 2.8%    Assessment & Plan:   Problem List Items Addressed This Visit   None   No follow-ups on file.    Shan Levans, MD

## 2022-09-06 ENCOUNTER — Encounter: Payer: Self-pay | Admitting: Critical Care Medicine

## 2022-09-06 ENCOUNTER — Ambulatory Visit: Payer: Commercial Managed Care - HMO | Attending: Critical Care Medicine | Admitting: Critical Care Medicine

## 2022-09-06 VITALS — BP 110/78 | HR 97 | Wt 230.6 lb

## 2022-09-06 DIAGNOSIS — E1169 Type 2 diabetes mellitus with other specified complication: Secondary | ICD-10-CM | POA: Diagnosis not present

## 2022-09-06 DIAGNOSIS — R197 Diarrhea, unspecified: Secondary | ICD-10-CM | POA: Diagnosis not present

## 2022-09-06 DIAGNOSIS — K649 Unspecified hemorrhoids: Secondary | ICD-10-CM

## 2022-09-06 DIAGNOSIS — H9203 Otalgia, bilateral: Secondary | ICD-10-CM

## 2022-09-06 DIAGNOSIS — Z6841 Body Mass Index (BMI) 40.0 and over, adult: Secondary | ICD-10-CM | POA: Diagnosis not present

## 2022-09-06 DIAGNOSIS — R7303 Prediabetes: Secondary | ICD-10-CM | POA: Diagnosis not present

## 2022-09-06 DIAGNOSIS — E669 Obesity, unspecified: Secondary | ICD-10-CM

## 2022-09-06 DIAGNOSIS — R0683 Snoring: Secondary | ICD-10-CM

## 2022-09-06 DIAGNOSIS — N898 Other specified noninflammatory disorders of vagina: Secondary | ICD-10-CM

## 2022-09-06 DIAGNOSIS — R0981 Nasal congestion: Secondary | ICD-10-CM

## 2022-09-06 DIAGNOSIS — R0982 Postnasal drip: Secondary | ICD-10-CM

## 2022-09-06 LAB — POCT GLYCOSYLATED HEMOGLOBIN (HGB A1C): HbA1c, POC (controlled diabetic range): 6.8 % (ref 0.0–7.0)

## 2022-09-06 LAB — GLUCOSE, POCT (MANUAL RESULT ENTRY): POC Glucose: 98 mg/dl (ref 70–99)

## 2022-09-06 MED ORDER — LORATADINE 10 MG PO TABS: 10.0000 mg | ORAL_TABLET | Freq: Every day | ORAL | 1 refills | Status: DC

## 2022-09-06 MED ORDER — DULOXETINE HCL 60 MG PO CPEP: 60.0000 mg | ORAL_CAPSULE | Freq: Every day | ORAL | 2 refills | Status: DC

## 2022-09-06 MED ORDER — METRONIDAZOLE 500 MG PO TABS: 500.0000 mg | ORAL_TABLET | Freq: Three times a day (TID) | ORAL | 0 refills | Status: AC

## 2022-09-06 MED ORDER — FLUTICASONE PROPIONATE 50 MCG/ACT NA SUSP: 2.0000 | Freq: Every day | NASAL | 6 refills | Status: DC

## 2022-09-06 MED ORDER — AMLODIPINE BESYLATE 2.5 MG PO TABS: 2.5000 mg | ORAL_TABLET | Freq: Every day | ORAL | 1 refills | Status: DC

## 2022-09-06 MED ORDER — HYDROCORT-PRAMOXINE (PERIANAL) 2.5-1 % EX CREA: 1.0000 | TOPICAL_CREAM | Freq: Three times a day (TID) | CUTANEOUS | 0 refills | Status: DC

## 2022-09-06 MED ORDER — TRULICITY 0.75 MG/0.5ML ~~LOC~~ SOAJ: 0.7500 mg | SUBCUTANEOUS | 1 refills | Status: DC

## 2022-09-06 MED ORDER — MICONAZOLE NITRATE 2 % VA CREA: 1.0000 | TOPICAL_CREAM | Freq: Every day | VAGINAL | 1 refills | Status: DC

## 2022-09-06 NOTE — Patient Instructions (Addendum)
Refills on all existing medications sent to your pharmacy  Start metronidazole otherwise known as Flagyl 1 pill 3 times daily for 5 days for diarrhea  Stay on Trulicity for now  Take a hair and nail vitamin supplement you can get this at your pharmacy over-the-counter 1 daily  Complete set of screening labs obtained at this visit and urine for protein obtained and stool samples for infection obtained  Return to see your primary care provider for follow-up in 6 weeks   ??????? ??? ???????? ?????? ??  ???? ?????? ???????????? ??????? ????? ???? ?????? 1 ??? 3 ???? ?????? ???? 5 ???? ????? ???????  ?????? ??? Trulicity ?? ????? ??????  ?????? ???? ??????? ????? ????????? ?????? ?????? ???? ?? ???????? ??? ???? ???? ??? ????? ??????  ?? ?????? ??? ?????? ????? ?? ?????? ????????? ???? ?? ?????? ????? ?? ??? ??????? ??? ?????? ??? ????? ????? ?? ???????? ?????? ?????? ?????? ???????  ?? ??????? ????? ???? ??????? ??????? ????? ?? ???????? ???? 6 ?????? eubuaat jamie al'adwiat almawjudat almursalat 'iilaa alsaydaliat alkhasat bik abda bitanawul mitrunidazul almaeruf aydan biasm flajil 1 habat 3 maraat ywmyan limudat 5 'ayaam lieilaj al'iishal albaqa' ealaa Trulicity fi alwaqt alraahin tanawali mukamil fitamin lilshier wal'azafir, [place] alhusul ealayh min alsaydaliat dun wasfat tibiyat maratan wahidatan ywmyan tama alhusul ealaa majmueat kamilat min fuhusat almukhtabarat alati tama alhusul ealayha fi hadhih alziyarat watama alhusul ealaa albawl bhthan ean alburutin waeayinat alburaz alkhasat bialeadwaa qum bialeawdat liruyat muqadam alrieayat al'awaliat alkhasi bik lilmutabaeat khilal 6 'asabie

## 2022-09-06 NOTE — Assessment & Plan Note (Signed)
Acute diarrhea presumed infectious etiology will obtain stool for pathogen and complete blood count and administer Flagyl 500 mg 3 times daily for 5 days

## 2022-09-06 NOTE — Assessment & Plan Note (Signed)
Continue with Trulicity

## 2022-09-07 LAB — COMPREHENSIVE METABOLIC PANEL
ALT: 28 IU/L (ref 0–32)
AST: 29 IU/L (ref 0–40)
Albumin/Globulin Ratio: 1.2 (ref 1.2–2.2)
Albumin: 3.8 g/dL — ABNORMAL LOW (ref 3.9–4.9)
Alkaline Phosphatase: 144 IU/L — ABNORMAL HIGH (ref 44–121)
BUN/Creatinine Ratio: 16 (ref 9–23)
BUN: 10 mg/dL (ref 6–24)
Bilirubin Total: <0.2 mg/dL (ref 0.0–1.2)
CO2: 24 mmol/L (ref 20–29)
Calcium: 9.8 mg/dL (ref 8.7–10.2)
Chloride: 98 mmol/L (ref 96–106)
Creatinine, Ser: 0.64 mg/dL (ref 0.57–1.00)
Globulin, Total: 3.3 g/dL (ref 1.5–4.5)
Glucose: 90 mg/dL (ref 70–99)
Potassium: 4.7 mmol/L (ref 3.5–5.2)
Sodium: 138 mmol/L (ref 134–144)
Total Protein: 7.1 g/dL (ref 6.0–8.5)
eGFR: 112 mL/min/{1.73_m2} (ref >59)

## 2022-09-07 LAB — MICROALBUMIN / CREATININE URINE RATIO
Creatinine, Urine: 75.3 mg/dL
Microalb/Creat Ratio: 11 mg/g creat (ref 0–29)
Microalbumin, Urine: 8.4 ug/mL

## 2022-09-07 LAB — CBC WITH DIFFERENTIAL/PLATELET
Basophils Absolute: 0 10*3/uL (ref 0.0–0.2)
Basos: 0 %
EOS (ABSOLUTE): 0.2 10*3/uL (ref 0.0–0.4)
Eos: 2 %
Hematocrit: 41 % (ref 34.0–46.6)
Hemoglobin: 12.6 g/dL (ref 11.1–15.9)
Immature Grans (Abs): 0.1 10*3/uL (ref 0.0–0.1)
Immature Granulocytes: 1 %
Lymphocytes Absolute: 2.4 10*3/uL (ref 0.7–3.1)
Lymphs: 23 %
MCH: 24.8 pg — ABNORMAL LOW (ref 26.6–33.0)
MCHC: 30.7 g/dL — ABNORMAL LOW (ref 31.5–35.7)
MCV: 81 fL (ref 79–97)
Monocytes Absolute: 0.5 10*3/uL (ref 0.1–0.9)
Monocytes: 5 %
Neutrophils Absolute: 7 10*3/uL (ref 1.4–7.0)
Neutrophils: 69 %
Platelets: 384 10*3/uL (ref 150–450)
RBC: 5.08 x10E6/uL (ref 3.77–5.28)
RDW: 13.9 % (ref 11.7–15.4)
WBC: 10.2 10*3/uL (ref 3.4–10.8)

## 2022-09-07 NOTE — Progress Notes (Signed)
All labs normal

## 2022-09-10 LAB — GI PROFILE, STOOL, PCR

## 2022-09-10 LAB — CAMPYLOBACTER CULTURE, STOOL

## 2022-09-11 NOTE — Progress Notes (Signed)
Let pt know all labs normal and stool study does not show any pathogens causing diarrhea

## 2022-09-12 ENCOUNTER — Telehealth: Payer: Self-pay

## 2022-09-12 NOTE — Telephone Encounter (Signed)
Pt was called and vm was left, Information has been sent to nurse pool.   Interpreter id #[id number] 

## 2022-09-12 NOTE — Telephone Encounter (Signed)
-----   Message from Storm Frisk, MD sent at 09/07/2022  1:51 PM EST ----- All labs normal

## 2022-09-12 NOTE — Telephone Encounter (Signed)
-----   Message from Storm Frisk, MD sent at 09/11/2022 12:48 PM EST ----- Let pt know all labs normal and stool study does not show any pathogens causing diarrhea

## 2022-09-12 NOTE — Telephone Encounter (Signed)
Pt was called and vm was left, Information has been sent to nurse pool.   Interpreter id #[id number]

## 2022-09-13 ENCOUNTER — Telehealth (HOSPITAL_COMMUNITY): Payer: Self-pay | Admitting: Physical Medicine & Rehabilitation

## 2022-09-14 ENCOUNTER — Telehealth (INDEPENDENT_AMBULATORY_CARE_PROVIDER_SITE_OTHER): Payer: Self-pay

## 2022-09-14 DIAGNOSIS — R197 Diarrhea, unspecified: Secondary | ICD-10-CM

## 2022-09-14 NOTE — Telephone Encounter (Signed)
-----   Message from Candice Hardy, RN sent at 09/13/2022  4:01 PM EST ----- Pt. Given results, verbalizes understanding. Still having diarrhea. Please advise pt.

## 2022-09-15 NOTE — Telephone Encounter (Signed)
Take OTC immodium as needed  GI referral made

## 2022-09-15 NOTE — Addendum Note (Signed)
Addended by: Shan Levans E on: 09/15/2022 10:11 AM   Modules accepted: Orders

## 2022-09-15 NOTE — Telephone Encounter (Signed)
Called patient and she is aware   Interpreter [phone]

## 2022-09-22 ENCOUNTER — Encounter: Payer: Self-pay | Admitting: Physical Medicine & Rehabilitation

## 2022-09-22 ENCOUNTER — Encounter
Payer: BLUE CROSS/BLUE SHIELD | Attending: Physical Medicine & Rehabilitation | Admitting: Physical Medicine & Rehabilitation

## 2022-09-22 ENCOUNTER — Ambulatory Visit
Admission: RE | Admit: 2022-09-22 | Discharge: 2022-09-22 | Disposition: A | Discharge status: Home / Self Care | Payer: BLUE CROSS/BLUE SHIELD | Source: Ambulatory Visit | Attending: Physical Medicine & Rehabilitation | Admitting: Physical Medicine & Rehabilitation

## 2022-09-22 ENCOUNTER — Other Ambulatory Visit: Payer: Self-pay | Admitting: Physical Medicine & Rehabilitation

## 2022-09-22 VITALS — BP 130/89 | HR 99 | Ht 65.0 in | Wt 230.0 lb

## 2022-09-22 DIAGNOSIS — M797 Fibromyalgia: Secondary | ICD-10-CM | POA: Insufficient documentation

## 2022-09-22 DIAGNOSIS — S82832A Other fracture of upper and lower end of left fibula, initial encounter for closed fracture: Secondary | ICD-10-CM | POA: Insufficient documentation

## 2022-09-22 DIAGNOSIS — F0630 Mood disorder due to known physiological condition, unspecified: Secondary | ICD-10-CM | POA: Insufficient documentation

## 2022-09-22 DIAGNOSIS — M25572 Pain in left ankle and joints of left foot: Secondary | ICD-10-CM | POA: Diagnosis not present

## 2022-09-22 MED ORDER — GABAPENTIN 300 MG PO CAPS: 300.0000 mg | ORAL_CAPSULE | Freq: Three times a day (TID) | ORAL | 1 refills | Status: DC

## 2022-09-22 MED ORDER — GABAPENTIN 300 MG PO CAPS: 300.0000 mg | ORAL_CAPSULE | Freq: Three times a day (TID) | ORAL | 4 refills | Status: DC

## 2022-09-22 MED ORDER — IBUPROFEN 400 MG PO TABS: 400.0000 mg | ORAL_TABLET | Freq: Four times a day (QID) | ORAL | 1 refills | Status: DC | PRN

## 2022-09-22 NOTE — Progress Notes (Signed)
Subjective:    Patient ID: Candice [name], female    DOB: 22-Dec-1975, 47 y.o.   MRN: [id number]  HPI  Candice [name] is a 47 yo female with PMH of lumbar back pain with radiculopathy here for neck and shoulder pain.  She reports neck and back pain since 2015. Pain is tingling, stabbing and shooting. She also has pain in her legs worse on the left. Pain radiates to all her fingers and toes.  Bending worsens the pain.  Inactivity and sitting also worsens the pain.  She does not exercise and is not very active a this time.  She had PT with minimal benefit. She reports general fatigue and sleep disturbance,often hard to fall asleep. Prior ESI x2 reports that helped her leg pain.  She uses tylenol in the past with minimal benefit and stopped due to concerns it would damage her liver if used chronically.  Flexeril and duloxetine help improve her pain.  No bowel or bladder changes.   Visit 08/26/22 Candice [name] is here for her chronic fibromyalgia.  She is here with an interpreter today.  She reports that her pain is overall much better since using duloxetine and starting gabapentin 100 mg 3 times daily.  She reports some possible dry mouth that she thinks may be due to duloxetine.  She does not feel like this is significant enough to discontinue the medication.  She denies sedation with gabapentin.  She continues to have some pain in her mid and upper back bilaterally.  She also continues to have some pain in her knees.  Knee pain is worsened with walking and also with kneeling during prayer.  Her pain improves with activity.   Visit 09/22/21 Candice [name] is here with her interpreter regarding her pain.  She reports she had a fall where she twisted her ankle on the left side about 2 weeks ago.  Since this time she has been having pain in her left ankle.  Pain is worsened with ambulation and movement of her ankle.  She has not tried any new medications for it.  She feels the gabapentin has been improving her  overall body wide pain and has not been causing any sedation or other side effects.   Pain Inventory Average Pain 10 Pain Right Now 10 My pain is sharp  In the last 24 hours, has pain interfered with the following? General activity 0 Relation with others 0 Enjoyment of life 0 What TIME of day is your pain at its worst? daytime Sleep (in general) Poor  Pain is worse with: walking, bending, sitting, standing, and some activites Pain improves with: rest and heat/ice Relief from Meds: 0  Family History  Problem Relation Age of Onset   Hypertension Father    High blood pressure Maternal Grandmother    Social History   Socioeconomic History   Marital status: Married    Spouse name: Not on file   Number of children: Not on file   Years of education: Not on file   Highest education level: Not on file  Occupational History   Not on file  Tobacco Use   Smoking status: Never   Smokeless tobacco: Never  Vaping Use   Vaping Use: Never used  Substance and Sexual Activity   Alcohol use: No   Drug use: No   Sexual activity: Yes  Other Topics Concern   Not on file  Social History Narrative   Not on file   Social Determinants of Health  Financial Resource Strain: Not on file  Food Insecurity: Not on file  Transportation Needs: Not on file  Physical Activity: Not on file  Stress: Not on file  Social Connections: Not on file   Past Surgical History:  Procedure Laterality Date   CIRCUMCISION  as a young girl    Type III femake circumcision    DILATION AND CURETTAGE OF UTERUS  2006   HEMORRHOID SURGERY  2015   HEMORRHOID SURGERY     in [place]   Past Surgical History:  Procedure Laterality Date   CIRCUMCISION  as a young girl    Type III femake circumcision    DILATION AND CURETTAGE OF UTERUS  2006   HEMORRHOID SURGERY  2015   HEMORRHOID SURGERY     in [place]   Past Medical History:  Diagnosis Date   Hypertension    Irritable bowel syndrome (IBS)    Prediabetes     Ht 5\' 5"  (1.651 m)   Wt 230 lb (104.3 kg)   BMI 38.27 kg/m   Opioid Risk Score:   Fall Risk Score:  `1  Depression screen [facility] 2/9     09/06/2022   11:00 AM 07/21/2022    9:16 AM 06/05/2022   11:33 AM 02/17/2022    1:17 PM 01/17/2022   10:21 AM 03/16/2021    3:02 PM 08/18/2020   10:03 AM  Depression screen PHQ 2/9  Decreased Interest 0 0 0 0 0 0 0  Down, Depressed, Hopeless 0 0 0 0 0 0 0  PHQ - 2 Score 0 0 0 0 0 0 0  Altered sleeping 0 0   1 0 0  Tired, decreased energy 0 0   0 0 0  Change in appetite 0 0   0 0 0  Feeling bad or failure about yourself  0 0   0 0 0  Trouble concentrating 0 0   1 0 0  Moving slowly or fidgety/restless 0 0   0 0 0  Suicidal thoughts 0 0   0 0 0  PHQ-9 Score 0 0   2 0 0      Review of Systems  Musculoskeletal:  Positive for gait problem.  All other systems reviewed and are negative.     Objective:   Physical Exam   Gen: no distress, normal appearing, very pleasant HEENT: oral mucosa pink and moist, NCAT Chest: normal effort, normal rate of breathing Abd: soft, non-distended Ext: no edema Psych: Very pleasant, normal affect Skin: intact, warm and dry Neuro: Follows commands, speech fluent, she has an interpreter CN 2-12 grossly  intact Musculoskeletal:  Strength 5/5 in b/l UE 5/5 in b/l LE other than left ankle limited by pain Left ankle swelling noted Sensation intact to LT in all 4 extremities, both sides of hands and feet Tone normal b/l Patient is very tender around her left ankle particular on the lateral side, pain with movement of the ankle in all directions No signs of joint infection noted     Assessment & Plan:  Fibromyalgia, appears to be improved overall -MRI Cspine does not indicate likely spinal cause of overall pain -Continue low impact progressive exercise -Continue duloxetine 60mg  daily -Increase gabapentin to 300 mg 3 times daily -Consider yoga or tai chi -Will consider order of Zynax device at next visit    Lumbar back pain with radiculopathy, Lumbar disc degeneration -S/P ESI in past by Dr. Ernestina Patches with improvement -May be contributing component to her overall pain -  Continue medications as above  Mood disorder -No SI or HI -Improved overall continue monitoring   Knee pain -Mild OA noted on bilateral knee x-rays -Can consider knee injections at a later time if needed -Discussed to hold Voltaren while using ibuprofen  Left ankle pain after fall -Left ankle x-ray ordered -Ibuprofen 400 mg every 6 hours as needed ordered   Patient was called at approximately 3:30 PM after ankle imaging completed showing and showed minimally displaced left fibular fracture -Discussed options for orthopedic evaluation, she says she will to go to [facility] as she can be evaluated today in after hours clinic that starts at 530 PM

## 2022-09-28 ENCOUNTER — Encounter: Payer: Self-pay | Admitting: Physician Assistant

## 2022-09-28 ENCOUNTER — Ambulatory Visit (INDEPENDENT_AMBULATORY_CARE_PROVIDER_SITE_OTHER): Payer: BLUE CROSS/BLUE SHIELD

## 2022-09-28 ENCOUNTER — Ambulatory Visit: Payer: BLUE CROSS/BLUE SHIELD | Admitting: Physician Assistant

## 2022-09-28 ENCOUNTER — Telehealth: Payer: Self-pay

## 2022-09-28 DIAGNOSIS — M25572 Pain in left ankle and joints of left foot: Secondary | ICD-10-CM

## 2022-09-28 DIAGNOSIS — S82832A Other fracture of upper and lower end of left fibula, initial encounter for closed fracture: Secondary | ICD-10-CM

## 2022-09-28 NOTE — Telephone Encounter (Signed)
Trulicity prior auth submitted to ins today via covermymeds Key: [id number]

## 2022-09-28 NOTE — Progress Notes (Signed)
Office Visit Note   Patient: Candice [name]           Date of Birth: [birth year]           MRN: [id number] Visit Date: 09/28/2022              Requested by: Gildardo Pounds, NP [address] [place],  [place] [id number] PCP: Gildardo Pounds, NP  Chief Complaint  Patient presents with  . Left Ankle - Pain      HPI: Candice [name]  is a pleasant 47 year old woman with a 3-week history of left lateral ankle pain.  She is seen today with a help of an interpreter.  She said 3 weeks ago she just lost her balance fell onto her left ankle.  She did have swelling and pain.  She denies any previous history of ankle injuries.  Focally tender over the lateral side of her ankle.  She saw her primary care provider last week.  She was told she had a fracture and referred to orthopedics she has been wearing a regular sneaker and admits her ankle continues to hurt  Assessment & Plan: Visit Diagnoses:  1. Pain in left ankle and joints of left foot     Plan: X-rays consistent with a Weber type a transverse distal fibula fracture.  This is now 3 weeks out and x-rays demonstrated is stable.  I do think she be more comfortable in a short cam boot we will provide this for her today.  I asked that she come out of the boot to do ankle range of motion.  Will reevaluate her in 2 weeks.  Also discussed taking ibuprofen with food 3 times a day to help with some of the swelling.  Follow-Up Instructions: 2 weeks  Ortho Exam  Patient is alert, oriented, no adenopathy, well-dressed, normal affect, normal respiratory effort. Examination of her left ankle she has resolving ecchymosis swelling more isolated to the lateral ankle.  She has good dorsiflexion plantarflexion eversion inversion.  Mild tenderness over the ankle joint line but most of her tenderness is over the distal fibula.  Compartments are soft and nontender negative Bevelyn Buckles' sign sensation is intact and pulses are intact no tenderness within the  midfoot  Imaging: XR Ankle Complete Left  Result Date: 09/28/2022 Three-view radiographs of her left ankle were obtained today.  She does have a Weber a distal fibula fracture that is minimally displaced.  She has tiny calcifications off the distal fibula and a very small 1 off the medial malleolus no other acute fractures noted well-maintained alignment through the mortise  No images are attached to the encounter.  Labs: Lab Results  Component Value Date   HGBA1C 6.8 09/06/2022   HGBA1C 6.8 07/21/2022   HGBA1C 6.2 02/24/2022   ESRSEDRATE 28 (H) 05/04/2016     Lab Results  Component Value Date   ALBUMIN 3.8 (L) 09/06/2022   ALBUMIN 4.2 02/24/2022   ALBUMIN 4.0 09/30/2021    No results found for: "MG" Lab Results  Component Value Date   VD25OH 39.4 08/03/2017   VD25OH 18 (L) 08/27/2015   VD25OH 16 (L) 04/07/2015    No results found for: "PREALBUMIN"    Latest Ref Rng & Units 09/06/2022   10:46 AM 09/30/2021   11:23 AM 08/12/2021    3:11 PM  CBC EXTENDED  WBC 3.4 - 10.8 x10E3/uL 10.2  8.8  12.1   RBC 3.77 - 5.28 x10E6/uL 5.08  4.55  3.92  Hemoglobin 11.1 - 15.9 g/dL 36.1  44.3  15.4   HCT 34.0 - 46.6 % 41.0  36.6  33.7   Platelets 150 - 450 x10E3/uL 384  394  463   NEUT# 1.4 - 7.0 x10E3/uL 7.0  6.0  8.4   Lymph# 0.7 - 3.1 x10E3/uL 2.4  2.0  2.6      There is no height or weight on file to calculate BMI.  Orders:  Orders Placed This Encounter  Procedures  . XR Ankle Complete Left   No orders of the defined types were placed in this encounter.    Procedures: No procedures performed  Clinical Data: No additional findings.  ROS:  All other systems negative, except as noted in the HPI. Review of Systems  All other systems reviewed and are negative.  Objective: Vital Signs: There were no vitals taken for this visit.  Specialty Comments:  MRI LUMBAR SPINE WITHOUT CONTRAST   TECHNIQUE: Multiplanar, multisequence MR imaging of the lumbar spine  was performed. No intravenous contrast was administered.   COMPARISON:  Radiography 06/02/2021.  MRI 02/16/2015.   FINDINGS: Segmentation:  5 lumbar type vertebral bodies.   Alignment:  Minimal scoliotic curvature convex to the right.   Vertebrae:  No fracture or focal lesion.   Conus medullaris and cauda equina: Conus extends to the T12-L1 level. Conus and cauda equina appear normal.   Paraspinal and other soft tissues: Negative   Disc levels:   No abnormality at L2-3 or above.   L3-4: Mild bulging of the disc. No compressive stenosis. No change since 2015-01-23.   L4-5: Disc degeneration with loss of disc height. Chronic shallow disc protrusion with slight caudal down turning. Slight indentation of the thecal sac and mild stenosis of the lateral recesses but without definite neural compression. No progressive change since 01-23-2015. The disc material has desiccated slightly.   L5-S1: Disc degeneration with bulging of the disc and a shallow protrusion towards the left. Mild facet and ligamentous hypertrophy. Stenosis of the subarticular lateral recess on the left that could possibly affect the left S1 nerve. The left subarticular lateral recess stenosis may be slightly more pronounced than was seen previously. The disc material has desiccated slightly.   IMPRESSION: L3-4: Minor, non-compressive disc bulge, unchanged.   L4-5: Shallow disc protrusion with slight caudal down turning. Slight indentation of the thecal sac and mild stenosis of both lateral recesses but no definite neural compression. No worsening since 01/23/2015. The disc material has desiccated slightly.   L5-S1: Shallow disc protrusion slightly more prominent towards the left. Stenosis of the subarticular lateral recess on the left that could possibly affect the left S1 nerve. The subarticular lateral recess narrowing may be slightly more pronounced than was seen in 2015-01-23. The disc material at this level has also  desiccated slightly.     Electronically Signed   By: Paulina Fusi M.D.   On: 06/07/2021 08:06  PMFS History: Patient Active Problem List   Diagnosis Date Noted  . Diarrhea of presumed infectious origin 09/06/2022  . Diabetes mellitus type 2 in obese (HCC) 09/06/2022  . Snoring 01/18/2022  . Prediabetes 04/23/2018  . History of gastroesophageal reflux (GERD) 08/03/2017  . History of vitamin D deficiency 08/03/2017  . History of Helicobacter pylori infection 08/03/2017  . History of urinary tract infection 08/03/2017  . Asymptomatic microscopic hematuria 08/03/2017  . Chronic abdominal pain 08/03/2017  . Left lower quadrant pain 06/05/2016  . Hemorrhoid 04/06/2016  . Body mass index (BMI)  40.0-44.9, adult ([place]) 10/28/2015  . Neck pain 10/28/2015  . Shoulder pain, bilateral 10/28/2015  . Vertigo 07/29/2015  . Tachycardia 07/08/2015  . Vitamin D deficiency 04/08/2015  . Lumbar back pain with radiculopathy affecting left lower extremity 04/07/2015  . 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 Chronic pain of multiple joints 01/29/2015  . Irregular menses 10/01/2014  . Allergic rhinitis 10/01/2014  . Chalazion of left upper eyelid 10/01/2014  . 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 Hypertension   . Irritable bowel syndrome (IBS)   . Prediabetes     Family History  Problem Relation Age of Onset  . Hypertension Father   . High blood pressure Maternal Grandmother     Past Surgical History:  Procedure Laterality Date  . 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 DILATION AND CURETTAGE OF UTERUS  2006  . HEMORRHOID SURGERY  2015  . HEMORRHOID SURGERY     in [place]   Social History   Occupational History  . Not on file  Tobacco Use  . Smoking status: Never  . Smokeless tobacco: Never  Vaping Use  . Vaping Use: Never used  Substance and Sexual Activity  . Alcohol use: No  . Drug use: No   . Sexual activity: Yes

## 2022-09-28 NOTE — Telephone Encounter (Signed)
Approved until 09/27/2023

## 2022-10-02 NOTE — Telephone Encounter (Signed)
Patient called in said that she received a VM from you to return a call to her, she didn't say what the VM was about but she had her appointment with orthopedic care and they placed her in a splint.

## 2022-10-03 ENCOUNTER — Ambulatory Visit: Payer: Commercial Managed Care - HMO | Admitting: Internal Medicine

## 2022-10-06 ENCOUNTER — Encounter: Payer: BLUE CROSS/BLUE SHIELD | Admitting: Physical Medicine & Rehabilitation

## 2022-10-10 NOTE — Telephone Encounter (Signed)
Called to see how ankle is doing, no answer, left discrete message

## 2022-10-11 NOTE — Telephone Encounter (Signed)
Patient called in saying that she was returning a call from you and was asking if you could give her a call back [phone]

## 2022-10-12 ENCOUNTER — Ambulatory Visit: Payer: BLUE CROSS/BLUE SHIELD | Admitting: Physician Assistant

## 2022-10-18 ENCOUNTER — Ambulatory Visit: Payer: Commercial Managed Care - HMO | Admitting: Nurse Practitioner

## 2022-10-20 ENCOUNTER — Ambulatory Visit (HOSPITAL_COMMUNITY)
Admission: EM | Admit: 2022-10-20 | Discharge: 2022-10-20 | Disposition: A | Discharge status: Home / Self Care | Payer: Commercial Managed Care - HMO | Attending: Internal Medicine | Admitting: Internal Medicine

## 2022-10-20 ENCOUNTER — Encounter (HOSPITAL_COMMUNITY): Payer: Self-pay | Admitting: *Deleted

## 2022-10-20 DIAGNOSIS — S82832D Other fracture of upper and lower end of left fibula, subsequent encounter for closed fracture with routine healing: Secondary | ICD-10-CM | POA: Diagnosis not present

## 2022-10-20 DIAGNOSIS — J209 Acute bronchitis, unspecified: Secondary | ICD-10-CM

## 2022-10-20 DIAGNOSIS — M79672 Pain in left foot: Secondary | ICD-10-CM | POA: Diagnosis not present

## 2022-10-20 MED ORDER — ALBUTEROL SULFATE HFA 108 (90 BASE) MCG/ACT IN AERS
2.0000 | INHALATION_SPRAY | Freq: Once | RESPIRATORY_TRACT | Status: AC
  Administered 2022-10-20: 2 via RESPIRATORY_TRACT

## 2022-10-20 MED ORDER — PREDNISONE 20 MG PO TABS: 40.0000 mg | ORAL_TABLET | Freq: Every day | ORAL | 0 refills | Status: AC

## 2022-10-20 MED ORDER — ALBUTEROL SULFATE HFA 108 (90 BASE) MCG/ACT IN AERS
INHALATION_SPRAY | RESPIRATORY_TRACT | Status: AC
  Filled 2022-10-20: qty 6.7

## 2022-10-20 MED ORDER — IBUPROFEN 600 MG PO TABS: 600.0000 mg | ORAL_TABLET | Freq: Four times a day (QID) | ORAL | 0 refills | Status: DC | PRN

## 2022-10-20 NOTE — ED Triage Notes (Signed)
Pt states she broke her left distal tibia and she has seen ortho and was advised to wear boot 2-3 weeks but she wore it 2 days. She states that when she wears the boot her back and right hip hurt.   She didn't make a follow up appt with ortho. I did give her the phone number to follow up.

## 2022-10-20 NOTE — Discharge Instructions (Addendum)
Take ibuprofen 600 mg every 6 hours as needed for pain and swelling.  Use the crutches.  Wear your boot!  This will help with your pain!!  If you develop any new or worsening symptoms or do not improve in the next 2 to 3 days, please return.  If your symptoms are severe, please go to the emergency room.  Follow-up with your primary care provider for further evaluation and management of your symptoms as well as ongoing wellness visits.  I hope you feel better!

## 2022-10-23 NOTE — ED Provider Notes (Signed)
[facility]    CSN: [id number] Arrival date & time: 10/20/22  1905      History   Chief Complaint Chief Complaint  Patient presents with   Broke Foot    HPI Candice [name] is a 47 y.o. female.   Patient presents to urgent care for evaluation of swelling and pain to the left foot related to known closed fracture of the left distal fibula that happened 2-3 weeks ago. She was seen by orthopedics initially after the injury where she was instructed to wear a CAM walker boot to the left foot to stabilize the left foot/ankle. She wore the boot for 2 days and has been wearing a tennis shoe or sandal ever since. She stopped wearing the boot because she states it was triggering her low back and right hip chronic pain (fibromyalgia per chart review). She is agreeable to wearing the boot, but would like crutches or other ambulating assistance as she complains the boot is heavy and triggers right back/right hip pain. She never followed back up with orthopedics and is requesting follow-up x-ray to be performed in clinic. No recent further injuries to the left foot/ankle.  Patient also mentions cough and congestion that have been ongoing for "a while now". She cannot quantify a time frame when the cough started.  She has heard wheezing to her chest and has had shortness of breath with exertion. Denies chest pain, fever/chills, dizziness, nausea, vomiting, diarrhea, body aches, vision changes, headache, orthopnea, and bilateral lower leg swelling. No known sick contacts with similar symptoms. She is not a smoker and denies drug use. She has been using OTC mediations without relief to try to help with cough.     Past Medical History:  Diagnosis Date   Hypertension    Irritable bowel syndrome (IBS)    Prediabetes     Patient Active Problem List   Diagnosis Date Noted   Closed avulsion fracture of distal fibula, left, initial encounter 09/28/2022   Diarrhea of presumed infectious origin  09/06/2022   Diabetes mellitus type 2 in obese (HCC) 09/06/2022   Snoring 01/18/2022   Prediabetes 04/23/2018   History of gastroesophageal reflux (GERD) 08/03/2017   History of vitamin D deficiency 08/03/2017   History of Helicobacter pylori infection 08/03/2017   History of urinary tract infection 08/03/2017   Asymptomatic microscopic hematuria 08/03/2017   Chronic abdominal pain 08/03/2017   Left lower quadrant pain 06/05/2016   Hemorrhoid 04/06/2016   Body mass index (BMI) 40.0-44.9, adult (HCC) 10/28/2015   Neck pain 10/28/2015   Shoulder pain, bilateral 10/28/2015   Vertigo 07/29/2015   Tachycardia 07/08/2015   Vitamin D deficiency 04/08/2015   Lumbar back pain with radiculopathy affecting left lower extremity 04/07/2015   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   Chronic pain of multiple joints 01/29/2015   Irregular menses 10/01/2014   Allergic rhinitis 10/01/2014   Chalazion of left upper eyelid 10/01/2014   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 Authorizing Provider  amLODipine (NORVASC) 2.5 MG tablet Take 1 tablet (2.5 mg total) by mouth daily. 09/06/22  Yes Elsie Stain, MD  Blood Glucose Monitoring Suppl [facility] VERIO REFLECT) w/Device KIT use daily as directed 11/24/21  Yes McClung, Dionne Bucy, PA-C  diclofenac Sodium (VOLTAREN) 1 % GEL Apply 4 g topically 4 (four) times daily. 08/25/22  Yes Jennye Boroughs, MD  Dulaglutide (TRULICITY) 8.41  LK/4.4WN SOPN Inject 0.75 mg into the skin once a week. 09/06/22  Yes Elsie Stain, MD  DULoxetine (CYMBALTA) 60 MG capsule Take 1 capsule (60 mg total) by mouth daily. 09/06/22  Yes Elsie Stain, MD  fluticasone (FLONASE) 50 MCG/ACT nasal spray Place 2 sprays into both nostrils daily. 09/06/22  Yes Elsie Stain, MD  gabapentin (NEURONTIN) 300 MG capsule Take 1 capsule (300 mg total) by mouth 3 (three) times daily. 09/22/22  Yes Jennye Boroughs, MD  glucose blood (ONETOUCH VERIO) test strip Use to check blood sugar 3-4 times daily. E11.69 08/03/22  Yes Charlott Rakes, MD  hydrocortisone-pramoxine [facility]) 2.5-1 % rectal cream Insert 1 application rectally 3 (three) times daily. 09/06/22  Yes Elsie Stain, MD  ibuprofen (ADVIL) 600 MG tablet Take 1 tablet (600 mg total) by mouth every 6 (six) hours as needed. 10/20/22  Yes Talbot Grumbling, FNP  loratadine (CLARITIN) 10 MG tablet Take 1 tablet (10 mg total) by mouth daily. 09/06/22  Yes Elsie Stain, MD  miconazole (MICONAZOLE 7) 2 % vaginal cream Place 1 Applicatorful vaginally at bedtime. 09/06/22  Yes Elsie Stain, MD  predniSONE (DELTASONE) 20 MG tablet Take 2 tablets (40 mg total) by mouth daily for 5 days. 10/20/22 10/25/22 Yes StanhopeStasia Cavalier, FNP    Family History Family History  Problem Relation Age of Onset   Hypertension Father    High blood pressure Maternal Grandmother     Social History Social History   Tobacco Use   Smoking status: Never   Smokeless tobacco: Never  Vaping Use   Vaping Use: Never used  Substance Use Topics   Alcohol use: No   Drug use: No     Allergies   Patient has no known allergies.   Review of Systems Review of Systems Per HPI  Physical Exam Triage Vital Signs ED Triage Vitals  Enc Vitals Group     BP 10/20/22 1944 (!) 155/102     Pulse Rate 10/20/22 1944 100     Resp 10/20/22 1944 18     Temp 10/20/22 1944 97.7 F (36.5 C)     Temp Source  10/20/22 1944 Oral     SpO2 10/20/22 1944 95 %     Weight --      Height --      Head Circumference --      Peak Flow --      Pain Score 10/20/22 1943 10     Pain Loc --      Pain Edu? --      Excl. in [place]? --    No data found.  Updated Vital Signs BP (!) 155/102 (BP Location: Right Arm)   Pulse 100   Temp 97.7 F (36.5  C) (Oral)   Resp 18   LMP  (LMP Unknown)   SpO2 95%   Visual Acuity Right Eye Distance:   Left Eye Distance:   Bilateral Distance:    Right Eye Near:   Left Eye Near:    Bilateral Near:     Physical Exam Vitals and nursing note reviewed.  Constitutional:      Appearance: She is not ill-appearing or toxic-appearing.  HENT:     Head: Normocephalic and atraumatic.     Right Ear: Hearing, tympanic membrane, ear canal and external ear normal.     Left Ear: Hearing, tympanic membrane, ear canal and external ear normal.     Nose: Nose normal. No congestion or rhinorrhea.     Mouth/Throat:     Lips: Pink.     Mouth: Mucous membranes are moist.     Pharynx: No posterior oropharyngeal erythema.     Comments: Small amount of clear postnasal drainage visualized to the posterior oropharynx.  Eyes:     General: Lids are normal. Vision grossly intact. Gaze aligned appropriately.        Right eye: No discharge.        Left eye: No discharge.     Extraocular Movements: Extraocular movements intact.     Conjunctiva/sclera: Conjunctivae normal.  Cardiovascular:     Rate and Rhythm: Normal rate and regular rhythm.     Heart sounds: Normal heart sounds, S1 normal and S2 normal.  Pulmonary:     Effort: Pulmonary effort is normal. No respiratory distress.     Breath sounds: Normal air entry. Wheezing present.     Comments: Faint and diffuse expiratory wheezes heard to all lung fields. No prolonged expiration or respiratory distress.  Musculoskeletal:     Cervical back: Neck supple.     Right lower leg: No edema.     Left lower leg: Swelling present. No edema.      Comments: Generalized tenderness to palpation of the left ankle. No deformity, ecchymosis, erythema, or warmth present. Slight swelling to the generalized left ankle appreciated to exam. +2 DP pulses bilaterally, +2 AT pulses bilaterally. Sensation intact distally to bilateral feet, cap refill less than 3. Patient hesitant to flex/extend LLE at ankle joint due to tenderness, however strength remains intact to toes of left foot.   Lymphadenopathy:     Cervical: No cervical adenopathy.  Skin:    General: Skin is warm and dry.     Capillary Refill: Capillary refill takes less than 2 seconds.     Findings: No rash.  Neurological:     General: No focal deficit present.     Mental Status: She is alert and oriented to person, place, and time. Mental status is at baseline.     Cranial Nerves: No dysarthria or facial asymmetry.  Psychiatric:        Mood and Affect: Mood normal.        Speech: Speech normal.        Behavior: Behavior normal.        Thought Content: Thought content normal.        Judgment: Judgment normal.      UC Treatments / Results  Labs (all labs ordered are listed, but only abnormal results are displayed) Labs Reviewed - No data to display  EKG   Radiology No results found.  Procedures Procedures (including critical care time)  Medications Ordered in UC Medications  albuterol (VENTOLIN HFA) 108 (90 Base) MCG/ACT inhaler 2 puff (2  puffs Inhalation Given 10/20/22 2013)    Initial Impression / Assessment and Plan / UC Course  I have reviewed the triage vital signs and the nursing notes.  Pertinent labs & imaging results that were available during my care of the patient were reviewed by me and considered in my medical decision making (see chart for details).   1. Left foot pain, closed fracture of distal end of left fibula with routine healing No indication for imaging to the left ankle/tib fib today. Patient is due for follow-up with orthopedics and has been  advised to call ortho for further evaluation and management of healing fracture. Advised to wear the CAM walker boot as this will help stabilize the injury to help it heal appropriately and reduce swelling/pain. Crutches provided in clinic for assistance with ambulation so that pain to the low back and right hip are not triggered. She is agreeable with this plan. She remains neurovascularly intact distal to injury of left ankle.  2. Acute bronchitis When I went to discharge the patient, she mentioned persistent cough ongoing for unknown amount of time. She was found to have diffuse expiratory wheeze to all lung fields. She is well-appearing with hemodynamically stable vital signs and no other adventitious sounds heard to auscultation, therefore deferred imaging of the chest. Tessalon perles and prednisone burst 40mg  QD sent to pharmacy to be taken as prescribed. No ibuprofen while taking prednisone due to increased risk of GI bleeding. Albuterol inhaler provided in clinic and one sent to pharmacy as well to be used every 4-6 hours as needed for cough, shortness of breath, and wheeze. Return to clinic precautions discussed.   Discussed physical exam and available lab work findings in clinic with patient.  Counseled patient regarding appropriate use of medications and potential side effects for all medications recommended or prescribed today. Discussed red flag signs and symptoms of worsening condition,when to call the PCP office, return to urgent care, and when to seek higher level of care in the emergency department. Patient verbalizes understanding and agreement with plan. All questions answered. Patient discharged in stable condition.    Final Clinical Impressions(s) / UC Diagnoses   Final diagnoses:  Left foot pain  Closed fracture of distal end of left fibula with routine healing, unspecified fracture morphology, subsequent encounter     Discharge Instructions      Take ibuprofen 600 mg every  6 hours as needed for pain and swelling.  Use the crutches.  Wear your boot!  This will help with your pain!!  If you develop any new or worsening symptoms or do not improve in the next 2 to 3 days, please return.  If your symptoms are severe, please go to the emergency room.  Follow-up with your primary care provider for further evaluation and management of your symptoms as well as ongoing wellness visits.  I hope you feel better!    ED Prescriptions     Medication Sig Dispense Auth. Provider   ibuprofen (ADVIL) 600 MG tablet Take 1 tablet (600 mg total) by mouth every 6 (six) hours as needed. 30 tablet Joella Prince M, FNP   predniSONE (DELTASONE) 20 MG tablet Take 2 tablets (40 mg total) by mouth daily for 5 days. 10 tablet Talbot Grumbling, FNP      PDMP not reviewed this encounter.   Talbot Grumbling, [place] 10/23/22 2130

## 2022-11-01 ENCOUNTER — Ambulatory Visit (INDEPENDENT_AMBULATORY_CARE_PROVIDER_SITE_OTHER): Payer: Commercial Managed Care - HMO | Admitting: Physician Assistant

## 2022-11-01 ENCOUNTER — Ambulatory Visit (INDEPENDENT_AMBULATORY_CARE_PROVIDER_SITE_OTHER): Payer: Commercial Managed Care - HMO

## 2022-11-01 DIAGNOSIS — S82832A Other fracture of upper and lower end of left fibula, initial encounter for closed fracture: Secondary | ICD-10-CM

## 2022-11-01 MED ORDER — MELOXICAM 7.5 MG PO TABS: 7.5000 mg | ORAL_TABLET | Freq: Every day | ORAL | 1 refills | Status: DC

## 2022-11-01 NOTE — Progress Notes (Signed)
Office Visit Note   Patient: Candice [name]           Date of Birth: [birth year]           MRN: [id number] Visit Date: 11/01/2022              Requested by: Gildardo Pounds, NP [address] [place],  [place] [address] PCP: Gildardo Pounds, NP  Chief Complaint  Patient presents with   Left Ankle - Pain      HPI: Patient is now 6 weeks status post injury to her left ankle.  Distal fibula Weber a fracture.  I did give her a boot for immobilization but because of some back issues she had trouble tolerating it and discontinued it to her regular shoe last week.  For the first 3 weeks after the injury she was just in a regular sneaker.  She said her pain is about the same and she still has some swelling.  Denies any fever chills or calf pain  Assessment & Plan: Visit Diagnoses:  1. Closed avulsion fracture of distal fibula, left, initial encounter     Plan: I do not see a lot of robust healing and fracture gap seems a little bit larger.  Do have some concerns for fibrous union.  I will place her in a brace which she is to wear all the time with a sneaker.  Will see is clinically how she is doing in a couple weeks x-rays should be taken at that time  Follow-Up Instructions: Return in about 2 weeks (around 11/15/2022).   Ortho Exam  Patient is alert, oriented, no adenopathy, well-dressed, normal affect, normal respiratory effort. Examination of her left ankle mild soft tissue swelling no ecchymosis no cellulitis.  She has good dorsiflexion plantarflexion eversion and inversion against resistance.  She still is focally tender over the area of the distal fibula.  No tenderness medially negative squeeze sign she is neurovascular intact  Imaging: XR Ankle Complete Left  Result Date: 11/01/2022 Radiographs of her left ankle were reviewed today and compared to previous.  Fracture has shifted just a little bit.  Cannot appreciate significant bony callus in the fracture area  itself.  She does have some ossification periarticular.  No images are attached to the encounter.  Labs: Lab Results  Component Value Date   HGBA1C 6.8 09/06/2022   HGBA1C 6.8 07/21/2022   HGBA1C 6.2 02/24/2022   ESRSEDRATE 28 (H) 05/04/2016     Lab Results  Component Value Date   ALBUMIN 3.8 (L) 09/06/2022   ALBUMIN 4.2 02/24/2022   ALBUMIN 4.0 09/30/2021    No results found for: "MG" Lab Results  Component Value Date   VD25OH 39.4 08/03/2017   VD25OH 18 (L) 08/27/2015   VD25OH 16 (L) 04/07/2015    No results found for: "PREALBUMIN"    Latest Ref Rng & Units 09/06/2022   10:46 AM 09/30/2021   11:23 AM 08/12/2021    3:11 PM  CBC EXTENDED  WBC 3.4 - 10.8 x10E3/uL 10.2  8.8  12.1   RBC 3.77 - 5.28 x10E6/uL 5.08  4.55  3.92   Hemoglobin 11.1 - 15.9 g/dL 12.6  11.2  10.4   HCT 34.0 - 46.6 % 41.0  36.6  33.7   Platelets 150 - 450 x10E3/uL 384  394  463   NEUT# 1.4 - 7.0 x10E3/uL 7.0  6.0  8.4   Lymph# 0.7 - 3.1 x10E3/uL 2.4  2.0  2.6  There is no height or weight on file to calculate BMI.  Orders:  Orders Placed This Encounter  Procedures   XR Ankle Complete Left   Meds ordered this encounter  Medications   meloxicam (MOBIC) 7.5 MG tablet    Sig: Take 1 tablet (7.5 mg total) by mouth daily.    Dispense:  30 tablet    Refill:  1     Procedures: No procedures performed  Clinical Data: No additional findings.  ROS:  All other systems negative, except as noted in the HPI. Review of Systems  Objective: Vital Signs: LMP  (LMP Unknown)   Specialty Comments:  MRI LUMBAR SPINE WITHOUT CONTRAST   TECHNIQUE: Multiplanar, multisequence MR imaging of the lumbar spine was performed. No intravenous contrast was administered.   COMPARISON:  Radiography 06/02/2021.  MRI 02/16/2015.   FINDINGS: Segmentation:  5 lumbar type vertebral bodies.   Alignment:  Minimal scoliotic curvature convex to the right.   Vertebrae:  No fracture or focal lesion.    Conus medullaris and cauda equina: Conus extends to the T12-L1 level. Conus and cauda equina appear normal.   Paraspinal and other soft tissues: Negative   Disc levels:   No abnormality at L2-3 or above.   L3-4: Mild bulging of the disc. No compressive stenosis. No change since 2014-12-01.   L4-5: Disc degeneration with loss of disc height. Chronic shallow disc protrusion with slight caudal down turning. Slight indentation of the thecal sac and mild stenosis of the lateral recesses but without definite neural compression. No progressive change since Dec 01, 2014. The disc material has desiccated slightly.   L5-S1: Disc degeneration with bulging of the disc and a shallow protrusion towards the left. Mild facet and ligamentous hypertrophy. Stenosis of the subarticular lateral recess on the left that could possibly affect the left S1 nerve. The left subarticular lateral recess stenosis may be slightly more pronounced than was seen previously. The disc material has desiccated slightly.   IMPRESSION: L3-4: Minor, non-compressive disc bulge, unchanged.   L4-5: Shallow disc protrusion with slight caudal down turning. Slight indentation of the thecal sac and mild stenosis of both lateral recesses but no definite neural compression. No worsening since 01-Dec-2014. The disc material has desiccated slightly.   L5-S1: Shallow disc protrusion slightly more prominent towards the left. Stenosis of the subarticular lateral recess on the left that could possibly affect the left S1 nerve. The subarticular lateral recess narrowing may be slightly more pronounced than was seen in 2014-12-01. The disc material at this level has also desiccated slightly.     Electronically Signed   By: Nelson Chimes M.D.   On: 06/07/2021 08:06  PMFS History: Patient Active Problem List   Diagnosis Date Noted   Closed avulsion fracture of distal fibula, left, initial encounter 09/28/2022   Diarrhea of presumed infectious origin  09/06/2022   Diabetes mellitus type 2 in obese ([place]) 09/06/2022   Snoring 01/18/2022   Prediabetes 04/23/2018   History of gastroesophageal reflux (GERD) 08/03/2017   History of vitamin D deficiency 99991111   History of Helicobacter pylori infection 08/03/2017   History of urinary tract infection 08/03/2017   Asymptomatic microscopic hematuria 08/03/2017   Chronic abdominal pain 08/03/2017   Left lower quadrant pain 06/05/2016   Hemorrhoid 04/06/2016   Body mass index (BMI) 40.0-44.9, adult ([place]) 10/28/2015   Neck pain 10/28/2015   Shoulder pain, bilateral 10/28/2015   Vertigo 07/29/2015   Tachycardia 07/08/2015   Vitamin D deficiency 04/08/2015   Lumbar back  pain with radiculopathy affecting left lower extremity 04/07/2015   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   Chronic pain of multiple joints 01/29/2015   Irregular menses 10/01/2014   Allergic rhinitis 10/01/2014   Chalazion of left upper eyelid 10/01/2014   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   Past Medical History:  Diagnosis Date   Hypertension    Irritable bowel syndrome (IBS)    Prediabetes     Family History  Problem Relation Age of Onset   Hypertension Father    High blood pressure Maternal Grandmother     Past Surgical History:  Procedure Laterality Date   CIRCUMCISION  as a young girl    Type III femake circumcision    DILATION AND CURETTAGE OF UTERUS  2006   HEMORRHOID SURGERY  2015   HEMORRHOID SURGERY     in [place]   Social History   Occupational History   Not on file  Tobacco Use   Smoking status: Never   Smokeless tobacco: Never  Vaping Use   Vaping Use: Never used  Substance and Sexual Activity   Alcohol use: No   Drug use: No   Sexual activity: Yes

## 2022-11-15 ENCOUNTER — Ambulatory Visit: Payer: Self-pay

## 2022-11-15 ENCOUNTER — Encounter: Payer: Self-pay | Admitting: Physician Assistant

## 2022-11-15 ENCOUNTER — Ambulatory Visit: Payer: Commercial Managed Care - HMO | Admitting: Physician Assistant

## 2022-11-15 DIAGNOSIS — S82832A Other fracture of upper and lower end of left fibula, initial encounter for closed fracture: Secondary | ICD-10-CM | POA: Diagnosis not present

## 2022-11-15 MED ORDER — MELOXICAM 7.5 MG PO TABS: 7.5000 mg | ORAL_TABLET | Freq: Every day | ORAL | 1 refills | Status: DC

## 2022-11-15 NOTE — Progress Notes (Signed)
Office Visit Note   Patient: Candice [name]           Date of Birth: [birth year]           MRN: [id number] Visit Date: 11/15/2022              Requested by: Gildardo Pounds, NP [address] [place],  [place] [address] PCP: Gildardo Pounds, NP  Chief Complaint  Patient presents with   Left Ankle - Pain      HPI: Patient is a pleasant 47 year old woman who I have been following for a left Weber a ankle fracture.  At her last visit she was still having quite a bit of pain she is now about 6 weeks out from the injury.  She had difficulty with the boot so I did place her in a brace.  She is found this much more comfortable and reports that she has a significant improvement.  She does notice if she has her leg down for a while that in the brace on that she has swelling in her calf.  She does not have any calf pain and this all resolves when she elevates her leg and removes the brace.  Denies any fever or chills  Assessment & Plan: Visit Diagnoses:  1. Closed avulsion fracture of distal fibula, left, initial encounter     Plan: She is doing better today.  She does have some swelling in her calf but again negative Bevelyn Buckles' sign no tenderness to deep palpation her compartments are soft.  Does improve with elevation.  No redness or signs of cellulitis.  I have encouraged her if she were to elevate her leg and also she could consider using a compression sock.  She has gotten a lot of relief from the meloxicam she has good kidney function I will refill this for per her request.  Also have given her exercises and therapy and to work on range of motion.  X-rays do still so stable Weber a fracture.  Still has a small fracture gap with periarticular bon bone.  She will follow-up 1 final visit in 3 weeks if she has any increasing pain at all especially in her ankle or calf she is to call me right away  Follow-Up Instructions: Return in about 3 weeks (around 12/06/2022).   Ortho  Exam  Patient is alert, oriented, no adenopathy, well-dressed, normal affect, normal respiratory effort. Left ankle she has no erythema mild soft tissue swelling she is minimally tender to palpation over the fracture site she has good dorsiflexion plantarflexion eversion inversion she does have a little pain with inversion but the peroneal tendons remain well reduced.  Her foot is warm with a strong palpable pulse compartments of her lower leg she has some mild soft tissue swelling improves with elevation there is no redness there is soft negative Bevelyn Buckles' sign no tenderness to deep palpation in the calf  Imaging: No results found. No images are attached to the encounter.  Labs: Lab Results  Component Value Date   HGBA1C 6.8 09/06/2022   HGBA1C 6.8 07/21/2022   HGBA1C 6.2 02/24/2022   ESRSEDRATE 28 (H) 05/04/2016     Lab Results  Component Value Date   ALBUMIN 3.8 (L) 09/06/2022   ALBUMIN 4.2 02/24/2022   ALBUMIN 4.0 09/30/2021    No results found for: "MG" Lab Results  Component Value Date   VD25OH 39.4 08/03/2017   VD25OH 18 (L) 08/27/2015   VD25OH 16 (L) 04/07/2015  No results found for: "PREALBUMIN"    Latest Ref Rng & Units 09/06/2022   10:46 AM 09/30/2021   11:23 AM 08/12/2021    3:11 PM  CBC EXTENDED  WBC 3.4 - 10.8 x10E3/uL 10.2  8.8  12.1   RBC 3.77 - 5.28 x10E6/uL 5.08  4.55  3.92   Hemoglobin 11.1 - 15.9 g/dL 12.6  11.2  10.4   HCT 34.0 - 46.6 % 41.0  36.6  33.7   Platelets 150 - 450 x10E3/uL 384  394  463   NEUT# 1.4 - 7.0 x10E3/uL 7.0  6.0  8.4   Lymph# 0.7 - 3.1 x10E3/uL 2.4  2.0  2.6      There is no height or weight on file to calculate BMI.  Orders:  Orders Placed This Encounter  Procedures   XR Ankle Complete Left   Meds ordered this encounter  Medications   meloxicam (MOBIC) 7.5 MG tablet    Sig: Take 1 tablet (7.5 mg total) by mouth daily.    Dispense:  30 tablet    Refill:  1     Procedures: No procedures performed  Clinical  Data: No additional findings.  ROS:  All other systems negative, except as noted in the HPI. Review of Systems  Objective: Vital Signs: LMP  (LMP Unknown)   Specialty Comments:  MRI LUMBAR SPINE WITHOUT CONTRAST   TECHNIQUE: Multiplanar, multisequence MR imaging of the lumbar spine was performed. No intravenous contrast was administered.   COMPARISON:  Radiography 06/02/2021.  MRI 02/16/2015.   FINDINGS: Segmentation:  5 lumbar type vertebral bodies.   Alignment:  Minimal scoliotic curvature convex to the right.   Vertebrae:  No fracture or focal lesion.   Conus medullaris and cauda equina: Conus extends to the T12-L1 level. Conus and cauda equina appear normal.   Paraspinal and other soft tissues: Negative   Disc levels:   No abnormality at L2-3 or above.   L3-4: Mild bulging of the disc. No compressive stenosis. No change since December 18, 2014.   L4-5: Disc degeneration with loss of disc height. Chronic shallow disc protrusion with slight caudal down turning. Slight indentation of the thecal sac and mild stenosis of the lateral recesses but without definite neural compression. No progressive change since 12-18-14. The disc material has desiccated slightly.   L5-S1: Disc degeneration with bulging of the disc and a shallow protrusion towards the left. Mild facet and ligamentous hypertrophy. Stenosis of the subarticular lateral recess on the left that could possibly affect the left S1 nerve. The left subarticular lateral recess stenosis may be slightly more pronounced than was seen previously. The disc material has desiccated slightly.   IMPRESSION: L3-4: Minor, non-compressive disc bulge, unchanged.   L4-5: Shallow disc protrusion with slight caudal down turning. Slight indentation of the thecal sac and mild stenosis of both lateral recesses but no definite neural compression. No worsening since 18-Dec-2014. The disc material has desiccated slightly.   L5-S1: Shallow disc  protrusion slightly more prominent towards the left. Stenosis of the subarticular lateral recess on the left that could possibly affect the left S1 nerve. The subarticular lateral recess narrowing may be slightly more pronounced than was seen in 2014-12-18. The disc material at this level has also desiccated slightly.     Electronically Signed   By: Nelson Chimes M.D.   On: 06/07/2021 08:06  PMFS History: Patient Active Problem List   Diagnosis Date Noted   Closed avulsion fracture of distal fibula, left, initial encounter 09/28/2022  Diarrhea of presumed infectious origin 09/06/2022   Diabetes mellitus type 2 in obese ([place]) 09/06/2022   Snoring 01/18/2022   Prediabetes 04/23/2018   History of gastroesophageal reflux (GERD) 08/03/2017   History of vitamin D deficiency 99991111   History of Helicobacter pylori infection 08/03/2017   History of urinary tract infection 08/03/2017   Asymptomatic microscopic hematuria 08/03/2017   Chronic abdominal pain 08/03/2017   Left lower quadrant pain 06/05/2016   Hemorrhoid 04/06/2016   Body mass index (BMI) 40.0-44.9, adult ([place]) 10/28/2015   Neck pain 10/28/2015   Shoulder pain, bilateral 10/28/2015   Vertigo 07/29/2015   Tachycardia 07/08/2015   Vitamin D deficiency 04/08/2015   Lumbar back pain with radiculopathy affecting left lower extremity 04/07/2015   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   Chronic pain of multiple joints 01/29/2015   Irregular menses 10/01/2014   Allergic rhinitis 10/01/2014   Chalazion of left upper eyelid 10/01/2014   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   Past Medical History:  Diagnosis Date   Hypertension    Irritable bowel syndrome (IBS)    Prediabetes     Family History  Problem Relation Age of Onset   Hypertension Father    High blood pressure Maternal Grandmother     Past Surgical History:  Procedure Laterality Date   CIRCUMCISION  as a young girl    Type III femake circumcision     DILATION AND CURETTAGE OF UTERUS  2006   HEMORRHOID SURGERY  2015   HEMORRHOID SURGERY     in [place]   Social History   Occupational History   Not on file  Tobacco Use   Smoking status: Never   Smokeless tobacco: Never  Vaping Use   Vaping Use: Never used  Substance and Sexual Activity   Alcohol use: No   Drug use: No   Sexual activity: Yes

## 2022-11-23 ENCOUNTER — Encounter (HOSPITAL_COMMUNITY): Payer: Self-pay

## 2022-11-23 ENCOUNTER — Ambulatory Visit (HOSPITAL_COMMUNITY)
Admission: EM | Admit: 2022-11-23 | Discharge: 2022-11-23 | Disposition: A | Discharge status: Home / Self Care | Payer: Commercial Managed Care - HMO | Attending: Physician Assistant | Admitting: Physician Assistant

## 2022-11-23 DIAGNOSIS — N644 Mastodynia: Secondary | ICD-10-CM

## 2022-11-23 NOTE — ED Triage Notes (Signed)
Per interpreter, pt started Trulicity last month and on gave herself a shot to abdomen once, then lt arm, then rt arm. States after the last shot to the arm she developed fever, fatigue, and pt to rt breast x10 days. Denies taking any meds for sx's or getting her injection this week.

## 2022-11-23 NOTE — Discharge Instructions (Signed)
Follow up with your Provider for recheck.

## 2022-11-23 NOTE — ED Provider Notes (Signed)
[facility]    CSN: [id number] Arrival date & time: 11/23/22  1239      History   Chief Complaint No chief complaint on file.   HPI Candice [name] is a 47 y.o. female.   Pt complains of pain in both arms and breast tenderness.  Pt reports she has been injecting trulicity into her arms for weight loss and thinks this is causing arm discomfort and breast discomfort.  Pt reports she is taking trulicity for weight loss   The history is provided by the patient. No language interpreter was used.    Past Medical History:  Diagnosis Date   Hypertension    Irritable bowel syndrome (IBS)    Prediabetes     Patient Active Problem List   Diagnosis Date Noted   Closed avulsion fracture of distal fibula, left, initial encounter 09/28/2022   Diarrhea of presumed infectious origin 09/06/2022   Diabetes mellitus type 2 in obese ([place]) 09/06/2022   Snoring 01/18/2022   Prediabetes 04/23/2018   History of gastroesophageal reflux (GERD) 08/03/2017   History of vitamin D deficiency 99991111   History of Helicobacter pylori infection 08/03/2017   History of urinary tract infection 08/03/2017   Asymptomatic microscopic hematuria 08/03/2017   Chronic abdominal pain 08/03/2017   Left lower quadrant pain 06/05/2016   Hemorrhoid 04/06/2016   Body mass index (BMI) 40.0-44.9, adult ([place]) 10/28/2015   Neck pain 10/28/2015   Shoulder pain, bilateral 10/28/2015   Vertigo 07/29/2015   Tachycardia 07/08/2015   Vitamin D deficiency 04/08/2015   Lumbar back pain with radiculopathy affecting left lower extremity 04/07/2015   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   Chronic pain of multiple joints 01/29/2015   Irregular menses 10/01/2014   Allergic rhinitis 10/01/2014   Chalazion of left upper eyelid 10/01/2014   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 Authorizing Provider  amLODipine (NORVASC) 2.5 MG tablet Take 1 tablet (2.5 mg total) by mouth daily. 09/06/22   Elsie Stain, MD  Blood Glucose Monitoring Suppl [facility] VERIO REFLECT) w/Device KIT use daily as directed 11/24/21   Argentina Donovan, PA-C  diclofenac Sodium (VOLTAREN) 1 % GEL Apply 4 g topically 4 (four) times daily. 08/25/22   Jennye Boroughs, MD  Dulaglutide (TRULICITY) A999333 0000000 SOPN Inject 0.75 mg into the skin once a week. 09/06/22   Elsie Stain, MD  DULoxetine (CYMBALTA) 60 MG capsule Take 1 capsule (60 mg total) by mouth daily. 09/06/22   Elsie Stain, MD  fluticasone (FLONASE) 50 MCG/ACT nasal spray Place 2 sprays into both nostrils daily. 09/06/22   Elsie Stain, MD  gabapentin (NEURONTIN) 300  MG capsule Take 1 capsule (300 mg total) by mouth 3 (three) times daily. 09/22/22   Jennye Boroughs, MD  glucose blood (ONETOUCH VERIO) test strip Use to check blood sugar 3-4 times daily. E11.69 08/03/22   Charlott Rakes, MD  hydrocortisone-pramoxine [facility]) 2.5-1 % rectal cream Insert 1 application rectally 3 (three) times daily. 09/06/22   Elsie Stain, MD  ibuprofen (ADVIL) 600 MG tablet Take 1 tablet (600 mg total) by mouth every 6 (six) hours as needed. 10/20/22   Talbot Grumbling, FNP  loratadine (CLARITIN) 10 MG tablet Take 1 tablet (10 mg total) by mouth daily. 09/06/22   Elsie Stain, MD  meloxicam (MOBIC) 7.5 MG tablet Take 1 tablet (7.5 mg total) by mouth daily. 11/15/22   Persons, Bevely Palmer, PA  miconazole (MICONAZOLE 7) 2 % vaginal cream Place  1 Applicatorful vaginally at bedtime. 09/06/22   Elsie Stain, MD    Family History Family History  Problem Relation Age of Onset   Hypertension Father    High blood pressure Maternal Grandmother     Social History Social History   Tobacco Use   Smoking status: Never   Smokeless tobacco: Never  Vaping Use   Vaping Use: Never used  Substance Use Topics   Alcohol use: No   Drug use: No     Allergies   Patient has no known allergies.   Review of Systems Review of Systems  All other systems reviewed and are negative.    Physical Exam Triage Vital Signs ED Triage Vitals [11/23/22 1409]  Enc Vitals Group     BP 127/80     Pulse Rate (!) 103     Resp 18     Temp 99 F (37.2 C)     Temp Source Oral     SpO2 96 %     Weight      Height      Head Circumference      Peak Flow      Pain Score      Pain Loc      Pain Edu?      Excl. in [place]?    No data found.  Updated Vital Signs BP 127/80 (BP Location: Left Arm)   Pulse (!) 103   Temp 99 F (37.2 C) (Oral)   Resp 18   SpO2 96%   Visual Acuity Right Eye Distance:   Left Eye Distance:   Bilateral Distance:    Right Eye Near:   Left Eye Near:    Bilateral Near:     Physical Exam Vitals reviewed.  HENT:     Head: Normocephalic.     Mouth/Throat:     Mouth: Mucous membranes are moist.  Cardiovascular:     Rate and Rhythm: Normal rate.     Comments: Breast no mass  Pulmonary:     Effort: Pulmonary effort is normal.  Abdominal:     General: Abdomen is flat.  Musculoskeletal:        General: Normal range of motion.  Skin:    General: Skin is warm.  Neurological:     General: No focal deficit present.     Mental Status: She is alert.  Psychiatric:        Mood and Affect: Mood normal.      UC Treatments / Results  Labs (all labs ordered are listed, but only abnormal results are displayed) Labs Reviewed - No data to display  EKG   Radiology No  results  found.  Procedures Procedures (including critical care time)  Medications Ordered in UC Medications - No data to display  Initial Impression / Assessment and Plan / UC Course  I have reviewed the triage vital signs and the nursing notes.  Pertinent labs & imaging results that were available during my care of the patient were reviewed by me and considered in my medical decision making (see chart for details).   Pt scheduled for mammogram due to discomfort.  Pt advised to follow up with her care provider for recheck   Final Clinical Impressions(s) / UC Diagnoses   Final diagnoses:  Breast pain     Discharge Instructions      Follow up with your Provider for recheck.    ED Prescriptions   None    PDMP not reviewed this encounter. An After Visit Summary was printed and given to the patient.    Fransico Meadow, [place] 11/23/22 1502

## 2022-11-28 ENCOUNTER — Other Ambulatory Visit (HOSPITAL_COMMUNITY): Payer: Self-pay | Admitting: Physician Assistant

## 2022-11-28 DIAGNOSIS — N644 Mastodynia: Secondary | ICD-10-CM

## 2022-12-04 ENCOUNTER — Other Ambulatory Visit: Payer: Self-pay | Admitting: Critical Care Medicine

## 2022-12-04 DIAGNOSIS — E1169 Type 2 diabetes mellitus with other specified complication: Secondary | ICD-10-CM

## 2022-12-05 ENCOUNTER — Ambulatory Visit: Payer: Commercial Managed Care - HMO | Admitting: Nurse Practitioner

## 2022-12-06 ENCOUNTER — Ambulatory Visit: Payer: Commercial Managed Care - HMO | Admitting: Physician Assistant

## 2022-12-12 ENCOUNTER — Other Ambulatory Visit: Payer: Self-pay | Admitting: Physician Assistant

## 2022-12-22 ENCOUNTER — Other Ambulatory Visit: Payer: Self-pay | Admitting: Critical Care Medicine

## 2022-12-22 NOTE — Telephone Encounter (Signed)
Unable to refill per protocol, Rx request is too soon.  Requested Prescriptions  Pending Prescriptions Disp Refills   amLODipine (NORVASC) 2.5 MG tablet [Pharmacy Med Name: AMLODIPINE BESYLATE 2.5MG  TABLETS] 90 tablet 1    Sig: TAKE 1 TABLET(2.5 MG) BY MOUTH DAILY     Cardiovascular: Calcium Channel Blockers 2 Passed - 12/22/2022  3:44 AM      Passed - Last BP in normal range    BP Readings from Last 1 Encounters:  11/23/22 127/80         Passed - Last Heart Rate in normal range    Pulse Readings from Last 1 Encounters:  11/23/22 (!) 103         Passed - Valid encounter within last 6 months    Recent Outpatient Visits           3 months ago Diarrhea of presumed infectious origin   [facility] Storm Frisk, MD   5 months ago Diabetes mellitus type 2 in obese [facility])   [place] [facility] Marcine Matar, MD   7 months ago Hemorrhoids, unspecified hemorrhoid type   [facility] [place], [place] L, RPH-CPP   8 months ago Hemorrhoids, unspecified hemorrhoid type   [facility] [place], Cornelius Moras, RPH-CPP   8 months ago Landscape architect and seasonal allergies   [facility] [place], Shea Stakes, NP

## 2023-01-03 ENCOUNTER — Encounter: Payer: Self-pay | Admitting: Physician Assistant

## 2023-01-03 ENCOUNTER — Ambulatory Visit: Payer: BLUE CROSS/BLUE SHIELD | Admitting: Physician Assistant

## 2023-01-03 ENCOUNTER — Other Ambulatory Visit (INDEPENDENT_AMBULATORY_CARE_PROVIDER_SITE_OTHER): Payer: BLUE CROSS/BLUE SHIELD

## 2023-01-03 DIAGNOSIS — R0789 Other chest pain: Secondary | ICD-10-CM

## 2023-01-03 DIAGNOSIS — R0781 Pleurodynia: Secondary | ICD-10-CM | POA: Diagnosis not present

## 2023-01-03 MED ORDER — MELOXICAM 7.5 MG PO TABS: 7.5000 mg | ORAL_TABLET | Freq: Every day | ORAL | 1 refills | Status: DC

## 2023-01-03 NOTE — Progress Notes (Signed)
Office Visit Note   Patient: Candice [name]           Date of Birth: 1975/10/06           MRN: [id number] Visit Date: 01/03/2023              Requested by: Claiborne Rigg, NP [address] [place] [address] [place],  [place] [address] PCP: Claiborne Rigg, NP   Assessment & Plan: Visit Diagnoses:  1. Rib pain on left side     Plan: Pleasant 47 year old woman was seen today with the help of an interpreter.  I have been seeing her for a left Weber C distal fibula fracture.  She did not seek treatment to for this until 3 to 4 weeks after the injury.  Because of this she has been improving we have been treating this conservatively today she really denies any difficulties and is wearing a sandal.  She is gets occasional swelling.  I told her to continue to wear supportive shoe wear or brace when she is out and about.  She also Comes in today stating that she has had left rib pain since her fall.  She did not think much of it but started bothering her more.  She denies any fever, chills, shortness of breath, nausea, or vomiting.  She also complains of chronic back pain between her shoulder blades.  I do not see anything acute on her rib x-rays.  She has had also some breast pain and was recently seen in the emergency room.  I have emphasized the importance that she follow-up make sure there is no other nonorthopedic issue that is causing her pain.  She did have a chest x-ray here today showed no broken ribs.  She understands to make an appointment with her primary care provider.  She does acknowledge that she has 1.  With regard to her chronic midthoracic back pain she does have macromastia would certainly could contribute to this.  I asked that she also discuss this with her primary care provider.  She understands if she has any increasing symptoms of shortness of breath fever chills chest pain she is to go to the nearest emergency room.  I will call her in some Mobic to hopefully help with some of the  rib pain.  She understands to take this with food and not to take ibuprofen.  She has taken ibuprofen without difficulty  Follow-Up Instructions: Return if symptoms worsen or fail to improve.   Orders:  Orders Placed This Encounter  Procedures   XR Ribs Unilateral Left   No orders of the defined types were placed in this encounter.     Procedures: No procedures performed   Clinical Data: No additional findings.   Subjective: Chief Complaint  Patient presents with   Chest - Pain    Rib pain due to fall in February 2024 C/o of radiating pain into bilateral shoulder blades    HPI pleasant 47 year old woman who is now 5 months status post a fall.  I have been seeing her for left ankle fracture.  She now states that she injured her rib when she fell and she is pain is increased.  Denies any fever chills shortness of breath or chest pain  Review of Systems  All other systems reviewed and are negative.    Objective: Vital Signs: There were no vitals taken for this visit.  Physical Exam Constitutional:      Appearance: Normal appearance.  HENT:  Head: Normocephalic.  Pulmonary:     Effort: Pulmonary effort is normal.  Skin:    General: Skin is warm and dry.     Capillary Refill: Capillary refill takes less than 2 seconds.  Neurological:     General: No focal deficit present.     Mental Status: She is alert and oriented to person, place, and time.     Ortho Exam Examination she is neurovascularly intact in her upper extremities strength is intact with resisted flexion extension of her arms good grip strength she has no pain with deep breathing.  She does have palpable pain to palpation over the lower left rib cage.  Does not radiate anywhere.  No ecchymosis Specialty Comments:  MRI LUMBAR SPINE WITHOUT CONTRAST   TECHNIQUE: Multiplanar, multisequence MR imaging of the lumbar spine was performed. No intravenous contrast was administered.   COMPARISON:   Radiography 06/02/2021.  MRI 02/16/2015.   FINDINGS: Segmentation:  5 lumbar type vertebral bodies.   Alignment:  Minimal scoliotic curvature convex to the right.   Vertebrae:  No fracture or focal lesion.   Conus medullaris and cauda equina: Conus extends to the T12-L1 level. Conus and cauda equina appear normal.   Paraspinal and other soft tissues: Negative   Disc levels:   No abnormality at L2-3 or above.   L3-4: Mild bulging of the disc. No compressive stenosis. No change since 18-Feb-2015.   L4-5: Disc degeneration with loss of disc height. Chronic shallow disc protrusion with slight caudal down turning. Slight indentation of the thecal sac and mild stenosis of the lateral recesses but without definite neural compression. No progressive change since 02/18/2015. The disc material has desiccated slightly.   L5-S1: Disc degeneration with bulging of the disc and a shallow protrusion towards the left. Mild facet and ligamentous hypertrophy. Stenosis of the subarticular lateral recess on the left that could possibly affect the left S1 nerve. The left subarticular lateral recess stenosis may be slightly more pronounced than was seen previously. The disc material has desiccated slightly.   IMPRESSION: L3-4: Minor, non-compressive disc bulge, unchanged.   L4-5: Shallow disc protrusion with slight caudal down turning. Slight indentation of the thecal sac and mild stenosis of both lateral recesses but no definite neural compression. No worsening since 02/18/15. The disc material has desiccated slightly.   L5-S1: Shallow disc protrusion slightly more prominent towards the left. Stenosis of the subarticular lateral recess on the left that could possibly affect the left S1 nerve. The subarticular lateral recess narrowing may be slightly more pronounced than was seen in 2015-02-18. The disc material at this level has also desiccated slightly.     Electronically Signed   By: Paulina Fusi M.D.   On:  06/07/2021 08:06  Imaging: XR Ribs Unilateral Left  Result Date: 01/03/2023 Radiographs of her ribs cannot previously any kind of fracture.  Imaging is a bit limited by habitus.    PMFS History: Patient Active Problem List   Diagnosis Date Noted   Rib pain on left side 01/03/2023   Closed avulsion fracture of distal fibula, left, initial encounter 09/28/2022   Diarrhea of presumed infectious origin 09/06/2022   Diabetes mellitus type 2 in obese 09/06/2022   Snoring 01/18/2022   Prediabetes 04/23/2018   History of gastroesophageal reflux (GERD) 08/03/2017   History of vitamin D deficiency 08/03/2017   History of Helicobacter pylori infection 08/03/2017   History of urinary tract infection 08/03/2017   Asymptomatic microscopic hematuria 08/03/2017   Chronic abdominal pain  08/03/2017   Left lower quadrant pain 06/05/2016   Hemorrhoid 04/06/2016   Body mass index (BMI) 40.0-44.9, adult 10/28/2015   Neck pain 10/28/2015   Shoulder pain, bilateral 10/28/2015   Vertigo 07/29/2015   Tachycardia 07/08/2015   Vitamin D deficiency 04/08/2015   Lumbar back pain with radiculopathy affecting left lower extremity 04/07/2015   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   Chronic pain of multiple joints 01/29/2015   Irregular menses 10/01/2014   Allergic rhinitis 10/01/2014   Chalazion of left upper eyelid 10/01/2014   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   Past Medical History:  Diagnosis Date   Hypertension    Irritable bowel syndrome (IBS)    Prediabetes     Family History  Problem Relation Age of Onset   Hypertension Father    High blood pressure Maternal Grandmother     Past Surgical History:  Procedure Laterality Date   CIRCUMCISION  as a young girl    Type III femake circumcision    DILATION AND CURETTAGE OF UTERUS  2006   HEMORRHOID SURGERY  2015   HEMORRHOID SURGERY     in [place]   Social History   Occupational History   Not on file  Tobacco Use    Smoking status: Never   Smokeless tobacco: Never  Vaping Use   Vaping Use: Never used  Substance and Sexual Activity   Alcohol use: No   Drug use: No   Sexual activity: Yes    Birth control/protection: None

## 2023-01-05 ENCOUNTER — Ambulatory Visit: Payer: Commercial Managed Care - HMO | Admitting: Nurse Practitioner

## 2023-01-11 ENCOUNTER — Ambulatory Visit: Payer: Self-pay | Admitting: *Deleted

## 2023-01-11 NOTE — Telephone Encounter (Signed)
Summary: Pt concerned about high blood sugar reading   Pt reports that everyday her blood sugar is high. Pt stated recently it was 233 and she is concerned. Pt requests call back to discuss. Cb#  [phone]         Interpreter:Adear#[id number] Attempted to call number provided- no answer- unable to leave VM Attempted to call home number- no answer - unable to leave message- VM not set up.

## 2023-01-11 NOTE — Telephone Encounter (Signed)
Using [employer] [phone]. Patient called on her cell number [phone].   Chief Complaint: high blood sugar readings 182 yesterday morning and this morning 288 Symptoms: weakness, nasal congestion Frequency: N/A Pertinent Negatives: Patient denies other symptoms Disposition: ED /[] Urgent Care (no appt availability in office) / Appointment(In office/virtual)/  [facility]/ Home Care/ Refused Recommended Disposition /[] [facility]/  Follow-up with PCP Additional Notes: Patient says she stopped taking trulicity given by Dr. Gavin Pound to help with weight loss because she realized it was insulin and increased her blood sugar, so she started taking the metformin. Advised she will need an OV to discuss, no availability with PCP until her scheduled OV on 02/15/23. She says she doesn't want to see any other provider. Advised I will send this to Zelda and someone will call back with her recommendation as far as an earlier appointment or advice on what to do with her medications, since Metformin is not on her current medication list. Patient verbalized understanding.    Summary: Pt concerned about high blood sugar reading   Pt reports that everyday her blood sugar is high. Pt stated recently it was 233 and she is concerned. Pt requests call back to discuss. Cb#  [phone]     Reason for Disposition  [1] Caller has NON-URGENT medication or insulin pump question AND [2] triager unable to answer question  Answer Assessment - Initial Assessment Questions 1. BLOOD GLUCOSE: "What is your blood glucose level?"      182 yesterday, 288 today 2. ONSET: "When did you check the blood glucose?"     In the morning 3. USUAL RANGE: "What is your glucose level usually?" (e.g., usual fasting morning value, usual evening value)     N/A 4. INSULIN: "Do you take insulin?" "What type of insulin(s) do you use? What is the mode of delivery? (syringe, pen; injection or  pump)?"      No 7. DIABETES PILLS: "Do you take any pills for your diabetes?" If Yes, ask: "Have you missed taking any pills recently?"     Metformin 8. OTHER SYMPTOMS: "Do you have any symptoms?" (e.g., fever, frequent urination, difficulty breathing, dizziness, weakness, vomiting)     Weakness, nasal congested  Protocols used: Diabetes - High Blood Sugar-A-AH

## 2023-01-12 ENCOUNTER — Other Ambulatory Visit: Payer: Commercial Managed Care - HMO

## 2023-02-15 ENCOUNTER — Ambulatory Visit: Payer: BLUE CROSS/BLUE SHIELD | Attending: Nurse Practitioner | Admitting: Nurse Practitioner

## 2023-02-15 ENCOUNTER — Encounter: Payer: Self-pay | Admitting: Nurse Practitioner

## 2023-02-15 VITALS — BP 130/84 | HR 109 | Ht 65.0 in | Wt 247.0 lb

## 2023-02-15 DIAGNOSIS — I10 Essential (primary) hypertension: Secondary | ICD-10-CM | POA: Diagnosis not present

## 2023-02-15 DIAGNOSIS — E7800 Pure hypercholesterolemia, unspecified: Secondary | ICD-10-CM

## 2023-02-15 DIAGNOSIS — D649 Anemia, unspecified: Secondary | ICD-10-CM

## 2023-02-15 DIAGNOSIS — E1165 Type 2 diabetes mellitus with hyperglycemia: Secondary | ICD-10-CM | POA: Diagnosis not present

## 2023-02-15 DIAGNOSIS — Z7984 Long term (current) use of oral hypoglycemic drugs: Secondary | ICD-10-CM

## 2023-02-15 DIAGNOSIS — R000 Tachycardia, unspecified: Secondary | ICD-10-CM

## 2023-02-15 DIAGNOSIS — Z6841 Body Mass Index (BMI) 40.0 and over, adult: Secondary | ICD-10-CM

## 2023-02-15 LAB — POCT GLYCOSYLATED HEMOGLOBIN (HGB A1C): HbA1c, POC (controlled diabetic range): 7.8 % — AB (ref 0.0–7.0)

## 2023-02-15 MED ORDER — AMLODIPINE BESYLATE 2.5 MG PO TABS
2.5000 mg | ORAL_TABLET | Freq: Every day | ORAL | 1 refills | Status: DC
Start: 2023-02-15 — End: 2023-05-14

## 2023-02-15 MED ORDER — TRUEPLUS LANCETS 28G MISC
3 refills | Status: DC
Start: 2023-02-15 — End: 2023-12-05

## 2023-02-15 MED ORDER — TRULICITY 0.75 MG/0.5ML ~~LOC~~ SOAJ
0.7500 mg | SUBCUTANEOUS | 2 refills | Status: DC
Start: 2023-02-15 — End: 2023-02-15

## 2023-02-15 MED ORDER — METFORMIN HCL 500 MG PO TABS
500.0000 mg | ORAL_TABLET | Freq: Two times a day (BID) | ORAL | 3 refills | Status: DC
Start: 2023-02-15 — End: 2023-06-25

## 2023-02-15 NOTE — Progress Notes (Signed)
Assessment & Plan:  Candice [name] was seen today for diabetes.  Diagnoses and all orders for this visit:  Primary hypertension -     amLODipine (NORVASC) 2.5 MG tablet; Take 1 tablet (2.5 mg total) by mouth daily. -     CMP14+EGFR  Type 2 diabetes mellitus with hyperglycemia, without long-term current use of insulin (HCC) -     POCT glycosylated hemoglobin (Hb A1C) -     Discontinue: Dulaglutide (TRULICITY) 0.75 MG/0.5ML SOPN; Inject 0.75 mg into the skin once a week. -     metFORMIN (GLUCOPHAGE) 500 MG tablet; Take 1-2 tablets (500-1,000 mg total) by mouth 2 (two) times daily with a meal. -     TRUEplus Lancets 28G MISC; Use as instructed. Check blood glucose level by fingerstick three times per day. -     CMP14+EGFR -     Thyroid Panel With TSH  Morbid obesity with BMI of 40.0-44.9, adult (HCC) -     Ambulatory referral to Plastic Surgery -     Thyroid Panel With TSH  Hypercholesterolemia -     Lipid panel  Anemia, unspecified type -     CBC with Differential  Tachycardia -     Thyroid Panel With TSH    Patient has been counseled on age-appropriate routine health concerns for screening and prevention. These are reviewed and up-to-date. Referrals have been placed accordingly. Immunizations are up-to-date or declined.    Subjective:   Chief Complaint  Patient presents with   Diabetes   HPI Candice [name] 47 y.o. female presents to office today for follow up to DM 2 and HTN  She has a past medical history of Hypertension, Irritable bowel syndrome (IBS), and Prediabetes.    VRI was used to communicate directly with patient for the entire encounter including providing detailed patient instructions.    Patient has been counseled on age-appropriate routine health concerns for screening and prevention. These are reviewed and up-to-date. Referrals have been placed accordingly. Immunizations are up-to-date or declined.     MAMMOGRAM: OVERDUE. Has been ordered. 0 PAP SMEAR: Patient  reports completed 12-2021.    HTN Blood pressure is well controlled today. She is currently taking amlodipine 2.5 mg daily BP Readings from Last 3 Encounters:  02/15/23 130/84  11/23/22 127/80  10/20/22 (!) 155/102     She requests referral to plastic surgeon. States she wants surgery to remove the fat out of her back. BMI 41   DM 2 Poorly controlled. She politely declines adding any additional diabetes medications. She is agreeable to increasing her metformin to 1000 mg BID. No longer taking trulicity. States it caused her to have breast pain.  Lab Results  Component Value Date   HGBA1C 7.8 (A) 02/15/2023    Lab Results  Component Value Date   HGBA1C 6.8 09/06/2022     Review of Systems  Constitutional:  Negative for fever, malaise/fatigue and weight loss.  HENT: Negative.  Negative for nosebleeds.   Eyes: Negative.  Negative for blurred vision, double vision and photophobia.  Respiratory: Negative.  Negative for cough and shortness of breath.   Cardiovascular: Negative.  Negative for chest pain, palpitations and leg swelling.  Gastrointestinal: Negative.  Negative for heartburn, nausea and vomiting.  Musculoskeletal: Negative.  Negative for myalgias.  Neurological: Negative.  Negative for dizziness, focal weakness, seizures and headaches.  Psychiatric/Behavioral: Negative.  Negative for suicidal ideas.     Past Medical History:  Diagnosis Date   Hypertension    Irritable bowel  syndrome (IBS)    Prediabetes     Past Surgical History:  Procedure Laterality Date   CIRCUMCISION  as a young girl    Type III femake circumcision    DILATION AND CURETTAGE OF UTERUS  2006   HEMORRHOID SURGERY  2015   HEMORRHOID SURGERY     in [place]    Family History  Problem Relation Age of Onset   Hypertension Father    High blood pressure Maternal Grandmother     Social History Reviewed with no changes to be made today.   Outpatient Medications Prior to Visit  Medication Sig  Dispense Refill   Blood Glucose Monitoring Suppl (ONETOUCH VERIO REFLECT) w/Device KIT use daily as directed 1 kit 0   diclofenac Sodium (VOLTAREN) 1 % GEL Apply 4 g topically 4 (four) times daily. 150 g 4   DULoxetine (CYMBALTA) 60 MG capsule Take 1 capsule (60 mg total) by mouth daily. 90 capsule 2   fluticasone (FLONASE) 50 MCG/ACT nasal spray Place 2 sprays into both nostrils daily. 16 g 6   gabapentin (NEURONTIN) 300 MG capsule Take 1 capsule (300 mg total) by mouth 3 (three) times daily. 270 capsule 1   glucose blood (ONETOUCH VERIO) test strip Use to check blood sugar 3-4 times daily. E11.69 100 each 2   hydrocortisone-pramoxine (ANALPRAM-HC) 2.5-1 % rectal cream Insert 1 application rectally 3 (three) times daily. 60 g 0   ibuprofen (ADVIL) 600 MG tablet Take 1 tablet (600 mg total) by mouth every 6 (six) hours as needed. 30 tablet 0   loratadine (CLARITIN) 10 MG tablet Take 1 tablet (10 mg total) by mouth daily. 90 tablet 1   meloxicam (MOBIC) 7.5 MG tablet Take 1 tablet (7.5 mg total) by mouth daily. 30 tablet 1   miconazole (MICONAZOLE 7) 2 % vaginal cream Place 1 Applicatorful vaginally at bedtime. 45 g 1   amLODipine (NORVASC) 2.5 MG tablet Take 1 tablet (2.5 mg total) by mouth daily. 90 tablet 1   Dulaglutide (TRULICITY) 0.75 MG/0.5ML SOPN ADMINISTER 0.75 MG UNDER THE SKIN 1 TIME A WEEK 2 mL 0   No facility-administered medications prior to visit.    No Known Allergies     Objective:    BP 130/84   Pulse (!) 109   Ht 5\' 5"  (1.651 m)   Wt 247 lb (112 kg)   SpO2 96%   BMI 41.10 kg/m  Wt Readings from Last 3 Encounters:  02/15/23 247 lb (112 kg)  09/22/22 230 lb (104.3 kg)  09/06/22 230 lb 9.6 oz (104.6 kg)    Physical Exam Vitals and nursing note reviewed.  Constitutional:      Appearance: She is well-developed.  HENT:     Head: Normocephalic and atraumatic.  Cardiovascular:     Rate and Rhythm: Regular rhythm. Tachycardia present.     Heart sounds: Normal  heart sounds. No murmur heard.    No friction rub. No gallop.  Pulmonary:     Effort: Pulmonary effort is normal. No tachypnea or respiratory distress.     Breath sounds: Normal breath sounds. No decreased breath sounds, wheezing, rhonchi or rales.  Chest:     Chest wall: No tenderness.  Abdominal:     General: Bowel sounds are normal.     Palpations: Abdomen is soft.  Musculoskeletal:        General: Normal range of motion.     Cervical back: Normal range of motion.  Skin:    General: Skin is  warm and dry.  Neurological:     Mental Status: She is alert and oriented to person, place, and time.     Coordination: Coordination normal.  Psychiatric:        Behavior: Behavior normal. Behavior is cooperative.        Thought Content: Thought content normal.        Judgment: Judgment normal.          Patient has been counseled extensively about nutrition and exercise as well as the importance of adherence with medications and regular follow-up. The patient was given clear instructions to go to ER or return to medical center if symptoms don't improve, worsen or new problems develop. The patient verbalized understanding.   Follow-up: Return in about 3 months (around 05/18/2023).   Claiborne Rigg, FNP-BC [facility] [place], [place] [phone]   02/15/2023, 2:50 PM

## 2023-02-16 LAB — CBC WITH DIFFERENTIAL/PLATELET
Basophils Absolute: 0 10*3/uL (ref 0.0–0.2)
Basos: 0 %
EOS (ABSOLUTE): 0.2 10*3/uL (ref 0.0–0.4)
Eos: 2 %
Hematocrit: 41.3 % (ref 34.0–46.6)
Hemoglobin: 12.9 g/dL (ref 11.1–15.9)
Immature Grans (Abs): 0.1 10*3/uL (ref 0.0–0.1)
Immature Granulocytes: 1 %
Lymphocytes Absolute: 1.8 10*3/uL (ref 0.7–3.1)
Lymphs: 20 %
MCH: 25.4 pg — ABNORMAL LOW (ref 26.6–33.0)
MCHC: 31.2 g/dL — ABNORMAL LOW (ref 31.5–35.7)
MCV: 82 fL (ref 79–97)
Monocytes Absolute: 0.3 10*3/uL (ref 0.1–0.9)
Monocytes: 3 %
Neutrophils Absolute: 6.9 10*3/uL (ref 1.4–7.0)
Neutrophils: 74 %
Platelets: 345 10*3/uL (ref 150–450)
RBC: 5.07 x10E6/uL (ref 3.77–5.28)
RDW: 13.9 % (ref 11.7–15.4)
WBC: 9.4 10*3/uL (ref 3.4–10.8)

## 2023-02-16 LAB — CMP14+EGFR
ALT: 23 IU/L (ref 0–32)
AST: 23 IU/L (ref 0–40)
Albumin/Globulin Ratio: 1.1 — ABNORMAL LOW (ref 1.2–2.2)
Albumin: 3.7 g/dL — ABNORMAL LOW (ref 3.9–4.9)
Alkaline Phosphatase: 186 IU/L — ABNORMAL HIGH (ref 44–121)
BUN/Creatinine Ratio: 13 (ref 9–23)
BUN: 9 mg/dL (ref 6–24)
Bilirubin Total: <0.2 mg/dL (ref 0.0–1.2)
CO2: 23 mmol/L (ref 20–29)
Calcium: 9.3 mg/dL (ref 8.7–10.2)
Chloride: 97 mmol/L (ref 96–106)
Creatinine, Ser: 0.71 mg/dL (ref 0.57–1.00)
Globulin, Total: 3.3 g/dL (ref 1.5–4.5)
Glucose: 215 mg/dL — ABNORMAL HIGH (ref 70–99)
Potassium: 4.2 mmol/L (ref 3.5–5.2)
Sodium: 139 mmol/L (ref 134–144)
Total Protein: 7 g/dL (ref 6.0–8.5)
eGFR: 107 mL/min/{1.73_m2} (ref >59)

## 2023-02-16 LAB — LIPID PANEL
Chol/HDL Ratio: 3.8 ratio (ref 0.0–4.4)
Cholesterol, Total: 227 mg/dL — ABNORMAL HIGH (ref 100–199)
HDL: 59 mg/dL (ref >39)
LDL Chol Calc (NIH): 131 mg/dL — ABNORMAL HIGH (ref 0–99)
Triglycerides: 209 mg/dL — ABNORMAL HIGH (ref 0–149)
VLDL Cholesterol Cal: 37 mg/dL (ref 5–40)

## 2023-02-16 LAB — THYROID PANEL WITH TSH
Free Thyroxine Index: 1.4 (ref 1.2–4.9)
T3 Uptake Ratio: 29 % (ref 24–39)
T4, Total: 4.8 ug/dL (ref 4.5–12.0)
TSH: 0.39 u[IU]/mL — ABNORMAL LOW (ref 0.450–4.500)

## 2023-02-19 ENCOUNTER — Telehealth: Payer: Self-pay

## 2023-02-19 NOTE — Telephone Encounter (Signed)
Copied from CRM [id number]. Topic: General - Other >> Feb 19, 2023  9:16 AM Candice [name] wrote: Reason for CRM: The patient would like to be contacted by a member of clinical staff to review their recent labs from 02/15/23  Please contact the patient further when possible

## 2023-02-20 NOTE — Telephone Encounter (Signed)
Lab results have not been resulted. Will contact patient when resulted.

## 2023-02-21 ENCOUNTER — Other Ambulatory Visit: Payer: Self-pay | Admitting: Nurse Practitioner

## 2023-02-21 DIAGNOSIS — R7989 Other specified abnormal findings of blood chemistry: Secondary | ICD-10-CM

## 2023-02-22 ENCOUNTER — Ambulatory Visit: Payer: BLUE CROSS/BLUE SHIELD | Attending: Nurse Practitioner

## 2023-02-22 DIAGNOSIS — R7989 Other specified abnormal findings of blood chemistry: Secondary | ICD-10-CM | POA: Diagnosis not present

## 2023-02-23 LAB — THYROID PANEL WITH TSH
Free Thyroxine Index: 1.7 (ref 1.2–4.9)
T3 Uptake Ratio: 30 % (ref 24–39)
T4, Total: 5.6 ug/dL (ref 4.5–12.0)
TSH: 0.864 u[IU]/mL (ref 0.450–4.500)

## 2023-02-23 LAB — HEPATIC FUNCTION PANEL
ALT: 24 IU/L (ref 0–32)
AST: 25 IU/L (ref 0–40)
Albumin: 3.8 g/dL — ABNORMAL LOW (ref 3.9–4.9)
Alkaline Phosphatase: 174 IU/L — ABNORMAL HIGH (ref 44–121)
Bilirubin Total: <0.2 mg/dL (ref 0.0–1.2)
Bilirubin, Direct: <0.1 mg/dL (ref 0.00–0.40)
Total Protein: 7 g/dL (ref 6.0–8.5)

## 2023-02-28 ENCOUNTER — Ambulatory Visit: Payer: Self-pay | Admitting: *Deleted

## 2023-02-28 NOTE — Telephone Encounter (Signed)
  Chief Complaint: Called in with interpreter for lab results from Bertram Denver, NP 02/23/2023 at 9:00 AM. Symptoms: N/A Frequency: N/A Pertinent Negatives: Patient denies N/A Disposition: [] ED /[] Urgent Care (no appt availability in office) / [] Appointment(In office/virtual)/ []  [facility]/ [x] Home Care/ [] Refused Recommended Disposition /[] [facility]/ []  Follow-up with PCP Additional Notes:

## 2023-02-28 NOTE — Telephone Encounter (Signed)
Reason for Disposition  [1] Follow-up call to recent contact AND [2] information only call, no triage required  Answer Assessment - Initial Assessment Questions 1. REASON FOR CALL or QUESTION: "What is your reason for calling today?" or "How can I best help you?" or "What question do you have that I can help answer?"  I read her the note from Bertram Denver, NP dated 02/23/2023 at 9:00 AM.  Pt called in with Candice [name] [phone]  Arabic, [place] interpreter.  Per interpreter she had a different dialect.  He asked me after she disconnected from the line where she was from, what language did she use.   I let interpreter know it was Arabic, [place] listed in her chart.    He had to clarify with her on some things.  She had several questions about 4-5 tests she had done.   I was looking in her chart to find out what she was talking about because there was no mention in Zelda's note 02/23/2023 of several tests.   I had read her the message.  Then she said something about a delay in getting her test results.  I let her know a letter had been mailed  (per note in chart) to her with test results did she get that letter?   She said yes but it didn't have the results of her tests.   I did not see anything about further tests.   While I was looking in her chart she hung up.   The interpreter called her name several times but she did not answer.   No one was on the line.   (See below also).   The communication was an issue also.     I've been following with Dr. Shon Hale.    She deals with my issues.   The dr read my chart because she conducted all these tests.   There has been a delay in informing me of the results.   For a long time they were silent and did not call me back and inform me.    There was delay.  Protocols used: Information Only Call - No Triage-A-AH

## 2023-03-02 ENCOUNTER — Other Ambulatory Visit: Payer: Self-pay | Admitting: Physical Medicine & Rehabilitation

## 2023-03-08 DIAGNOSIS — N970 Female infertility associated with anovulation: Secondary | ICD-10-CM | POA: Diagnosis not present

## 2023-03-09 ENCOUNTER — Telehealth: Payer: Self-pay

## 2023-03-09 ENCOUNTER — Encounter: Payer: 59 | Attending: Physical Medicine & Rehabilitation | Admitting: Physical Medicine & Rehabilitation

## 2023-03-09 ENCOUNTER — Encounter: Payer: Self-pay | Admitting: Nurse Practitioner

## 2023-03-09 VITALS — BP 137/95 | HR 105 | Ht 65.0 in | Wt 247.0 lb

## 2023-03-09 DIAGNOSIS — S82832D Other fracture of upper and lower end of left fibula, subsequent encounter for closed fracture with routine healing: Secondary | ICD-10-CM

## 2023-03-09 DIAGNOSIS — F0630 Mood disorder due to known physiological condition, unspecified: Secondary | ICD-10-CM | POA: Diagnosis not present

## 2023-03-09 DIAGNOSIS — M545 Low back pain, unspecified: Secondary | ICD-10-CM | POA: Diagnosis not present

## 2023-03-09 DIAGNOSIS — M797 Fibromyalgia: Secondary | ICD-10-CM

## 2023-03-09 DIAGNOSIS — M940 Chondrocostal junction syndrome [Tietze]: Secondary | ICD-10-CM

## 2023-03-09 MED ORDER — LIDOCAINE 4 % EX PTCH: 1.0000 | MEDICATED_PATCH | CUTANEOUS | 4 refills | Status: DC

## 2023-03-09 NOTE — Telephone Encounter (Signed)
Noted  

## 2023-03-09 NOTE — Telephone Encounter (Signed)
Patient came in asking about her recent lab results, I printed out the results for her from 02/22/23 she had other several questions about her medications and lab results that I couldn't answer patient is requesting a call back to discuss this. Will schedule MyChart visit

## 2023-03-09 NOTE — Progress Notes (Addendum)
Subjective:    Patient ID: Candice [name], female    DOB: 1976-02-26, 47 y.o.   MRN: [id number]  HPI   Leighna Worman is a 47 yo female with PMH of lumbar back pain with radiculopathy here for neck and shoulder pain.  She reports neck and back pain since 2015. Pain is tingling, stabbing and shooting. She also has pain in her legs worse on the left. Pain radiates to all her fingers and toes.  Bending worsens the pain.  Inactivity and sitting also worsens the pain.  She does not exercise and is not very active a this time.  She had PT with minimal benefit. She reports general fatigue and sleep disturbance,often hard to fall asleep. Prior ESI x2 reports that helped her leg pain.  She uses tylenol in the past with minimal benefit and stopped due to concerns it would damage her liver if used chronically.  Flexeril and duloxetine help improve her pain.  No bowel or bladder changes.   Visit 08/26/22 Candice [name] is here for her chronic fibromyalgia.  She is here with an interpreter today.  She reports that her pain is overall much better since using duloxetine and starting gabapentin 100 mg 3 times daily.  She reports some possible dry mouth that she thinks may be due to duloxetine.  She does not feel like this is significant enough to discontinue the medication.  She denies sedation with gabapentin.  She continues to have some pain in her mid and upper back bilaterally.  She also continues to have some pain in her knees.  Knee pain is worsened with walking and also with kneeling during prayer.  Her pain improves with activity.   Visit 09/22/21 Candice [name] is here with her interpreter regarding her pain.  She reports she had a fall where she twisted her ankle on the left side about 2 weeks ago.  Since this time she has been having pain in her left ankle.  Pain is worsened with ambulation and movement of her ankle.  She has not tried any new medications for it.  She feels the gabapentin has been improving her  overall body wide pain and has not been causing any sedation or other side effects.    Interval history 03/13/2023 Patient is here for follow-up regarding her chronic pain.  She is with her interpreter today.  Patient was followed by [facility] for her left ankle fracture, she reports this is now healed and doing well.  Patient reports that her current medications of Cymbalta and gabapentin are helping reduce her pain overall.  She feels like her lower back pain is under control.  She does continue to have some discomfort in her periscapular muscles that is not controlled with these medications.  She also reports having some chest wall pain with activity mostly on the left side, was seen by [facility]o [facility] for this in April.  She had rib x-rays completed at [facility] without evidence of fractures.   Pain Inventory Average Pain 10 Pain Right Now 10 My pain is intermittent, constant, sharp, and dull  In the last 24 hours, has pain interfered with the following? General activity 10 Relation with others 10 Enjoyment of life 10 What TIME of day is your pain at its worst? daytime Sleep (in general) poor  Pain is worse with: walking, bending, sitting, standing, and some activites Pain improves with: medication Relief from Meds: 0  Family History  Problem Relation Age of Onset   Hypertension  Father    High blood pressure Maternal Grandmother    Social History   Socioeconomic History   Marital status: Married    Spouse name: Not on file   Number of children: Not on file   Years of education: Not on file   Highest education level: Not on file  Occupational History   Not on file  Tobacco Use   Smoking status: Never   Smokeless tobacco: Never  Vaping Use   Vaping Use: Never used  Substance and Sexual Activity   Alcohol use: No   Drug use: No   Sexual activity: Yes    Birth control/protection: None  Other Topics Concern   Not on file  Social History Narrative   Not on file   Social  Determinants of Health   Financial Resource Strain: Not on file  Food Insecurity: Not on file  Transportation Needs: Not on file  Physical Activity: Not on file  Stress: Not on file  Social Connections: Not on file   Past Surgical History:  Procedure Laterality Date   CIRCUMCISION  as a young girl    Type III femake circumcision    DILATION AND CURETTAGE OF UTERUS  2006   HEMORRHOID SURGERY  2015   HEMORRHOID SURGERY     in [place]   Past Surgical History:  Procedure Laterality Date   CIRCUMCISION  as a young girl    Type III femake circumcision    DILATION AND CURETTAGE OF UTERUS  2006   HEMORRHOID SURGERY  2015   HEMORRHOID SURGERY     in [place]   Past Medical History:  Diagnosis Date   Hypertension    Irritable bowel syndrome (IBS)    Prediabetes    BP (!) 137/95   Pulse (!) 105   Ht 5\' 5"  (1.651 m)   Wt 247 lb (112 kg)   SpO2 93%   BMI 41.10 kg/m   Opioid Risk Score:   Fall Risk Score:  `1  Depression screen [facility] 2/9     03/09/2023    3:05 PM 09/06/2022   11:00 AM 07/21/2022    9:16 AM 06/05/2022   11:33 AM 02/17/2022    1:17 PM 01/17/2022   10:21 AM 03/16/2021    3:02 PM  Depression screen PHQ 2/9  Decreased Interest 0 0 0 0 0 0 0  Down, Depressed, Hopeless 0 0 0 0 0 0 0  PHQ - 2 Score 0 0 0 0 0 0 0  Altered sleeping  0 0   1 0  Tired, decreased energy  0 0   0 0  Change in appetite  0 0   0 0  Feeling bad or failure about yourself   0 0   0 0  Trouble concentrating  0 0   1 0  Moving slowly or fidgety/restless  0 0   0 0  Suicidal thoughts  0 0   0 0  PHQ-9 Score  0 0   2 0     Review of Systems  Musculoskeletal:  Positive for back pain.       B/L shoulder pain ribs  All other systems reviewed and are negative.      Objective:   Physical Exam     Gen: no distress, normal appearing, very pleasant HEENT: oral mucosa pink and moist, NCAT Chest: normal effort, normal rate of breathing Abd: soft, non-distended Ext: no edema Psych: Very  pleasant, normal affect Skin: intact, warm and dry  Neuro: Follows commands,  she has an interpreter CN 2-12 grossly  intact Musculoskeletal:  Strength 5/5 in b/l UE 5/5 in b/l LE  Sensation intact to LT in all 4 extremities, both sides of hands and feet Tone normal b/l Bilateral trapezius and overall periscapular tenderness Left greater than right lower chest wall tenderness No signs of joint infection noted       Assessment & Plan:    Fibromyalgia, appears to be improved overall -MRI Cspine does not indicate likely spinal cause of overall pain -Continue low impact progressive exercise -Continue duloxetine 60mg  daily -Increase gabapentin to 300 mg 3 times daily -Consider yoga or tai chi -Will consider order of Zynax cryoheat device -Will schedule for trigger points for periscapular muscles -Advised to try Thera cane -PT if if doesn't improve  Chest wall pain.  Suspect costochondritis.  X-ray without evidence of fracture completed by [facility]o care. -She reports minimal improvement with Mobic or ibuprofen -Will order lidocaine patch for chest wall   Lumbar back pain with radiculopathy, Lumbar disc degeneration -S/P ESI in past by Dr. Alvester Morin with improvement -May be contributing component to her overall pain -Continue medications as above   Mood disorder -No SI or HI -Patient says her mood is doing better   Knee pain -Mild OA noted on bilateral knee x-rays -Patient reports her knee pain is improved overall   Left ankle pain after fall -Noted to have left fibular fracture, she was referred to [facility]o care and she says this is now healed.

## 2023-03-13 ENCOUNTER — Encounter: Payer: Self-pay | Admitting: Physical Medicine & Rehabilitation

## 2023-03-14 ENCOUNTER — Other Ambulatory Visit: Payer: Self-pay | Admitting: Physical Medicine & Rehabilitation

## 2023-03-22 NOTE — Progress Notes (Signed)
Subjective:    Patient ID: Candice [name], female    DOB: 1976-04-04, 47 y.o.   MRN: [id number]  HPI   Candice [name] is a 47 yo female with PMH of lumbar back pain with radiculopathy here for neck and shoulder pain.  She reports neck and back pain since 2015. Pain is tingling, stabbing and shooting. She also has pain in her legs worse on the left. Pain radiates to all her fingers and toes.  Bending worsens the pain.  Inactivity and sitting also worsens the pain.  She does not exercise and is not very active a this time.  She had PT with minimal benefit. She reports general fatigue and sleep disturbance,often hard to fall asleep. Prior ESI x2 reports that helped her leg pain.  She uses tylenol in the past with minimal benefit and stopped due to concerns it would damage her liver if used chronically.  Flexeril and duloxetine help improve her pain.  No bowel or bladder changes.   Visit 08/26/22 Candice [name] is here for her chronic fibromyalgia.  She is here with an interpreter today.  She reports that her pain is overall much better since using duloxetine and starting gabapentin 100 mg 3 times daily.  She reports some possible dry mouth that she thinks may be due to duloxetine.  She does not feel like this is significant enough to discontinue the medication.  She denies sedation with gabapentin.  She continues to have some pain in her mid and upper back bilaterally.  She also continues to have some pain in her knees.  Knee pain is worsened with walking and also with kneeling during prayer.  Her pain improves with activity.   Visit 09/22/21 Candice [name] is here with her interpreter regarding her pain.  She reports she had a fall where she twisted her ankle on the left side about 2 weeks ago.  Since this time she has been having pain in her left ankle.  Pain is worsened with ambulation and movement of her ankle.  She has not tried any new medications for it.  She feels the gabapentin has been improving her  overall body wide pain and has not been causing any sedation or other side effects.    Interval history 03/13/2023 Patient is here for follow-up regarding her chronic pain.  She is with her interpreter today.  Patient was followed by Ortho care for her left ankle fracture, she reports this is now healed and doing well.  Patient reports that her current medications of Cymbalta and gabapentin are helping reduce her pain overall.  She feels like her lower back pain is under control.  She does continue to have some discomfort in her periscapular muscles that is not controlled with these medications.  She also reports having some chest wall pain with activity mostly on the left side, was seen by [facility]o care for this in April.  She had rib x-rays completed at [facility] without evidence of fractures.   Pain Inventory Average Pain 10 Pain Right Now 10 My pain is intermittent, constant, sharp, and dull  In the last 24 hours, has pain interfered with the following? General activity 10 Relation with others 10 Enjoyment of life 10 What TIME of day is your pain at its worst? daytime Sleep (in general) poor  Pain is worse with: walking, bending, sitting, standing, and some activites Pain improves with: medication Relief from Meds: 0  Family History  Problem Relation Age of Onset  . Hypertension  Father   . High blood pressure Maternal Grandmother    Social History   Socioeconomic History  . Marital status: Married    Spouse name: Not on file  . Number of children: Not on file  . Years of education: Not on file  . Highest education level: Not on file  Occupational History  . Not on file  Tobacco Use  . Smoking status: Never  . Smokeless tobacco: Never  Vaping Use  . Vaping Use: Never used  Substance and Sexual Activity  . Alcohol use: No  . Drug use: No  . Sexual activity: Yes    Birth control/protection: None  Other Topics Concern  . Not on file  Social History Narrative  . Not on  file   Social Determinants of Health   Financial Resource Strain: Not on file  Food Insecurity: Not on file  Transportation Needs: Not on file  Physical Activity: Not on file  Stress: Not on file  Social Connections: Not on file   Past Surgical History:  Procedure Laterality Date  . 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 DILATION AND CURETTAGE OF UTERUS  2006  . HEMORRHOID SURGERY  2015  . HEMORRHOID SURGERY     in [place]   Past Surgical History:  Procedure Laterality Date  . 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 DILATION AND CURETTAGE OF UTERUS  2006  . HEMORRHOID SURGERY  2015  . HEMORRHOID SURGERY     in [place]   Past Medical History:  Diagnosis Date  . Hypertension   . Irritable bowel syndrome (IBS)   . Prediabetes    BP (!) 137/95   Pulse (!) 105   Ht 5\' 5"  (1.651 m)   Wt 247 lb (112 kg)   SpO2 93%   BMI 41.10 kg/m   Opioid Risk Score:   Fall Risk Score:  `1  Depression screen [facility] 2/9     03/09/2023    3:05 PM 09/06/2022   11:00 AM 07/21/2022    9:16 AM 06/05/2022   11:33 AM 02/17/2022    1:17 PM 01/17/2022   10:21 AM 03/16/2021    3:02 PM  Depression screen PHQ 2/9  Decreased Interest 0 0 0 0 0 0 0  Down, Depressed, Hopeless 0 0 0 0 0 0 0  PHQ - 2 Score 0 0 0 0 0 0 0  Altered sleeping  0 0   1 0  Tired, decreased energy  0 0   0 0  Change in appetite  0 0   0 0  Feeling bad or failure about yourself   0 0   0 0  Trouble concentrating  0 0   1 0  Moving slowly or fidgety/restless  0 0   0 0  Suicidal thoughts  0 0   0 0  PHQ-9 Score  0 0   2 0     Review of Systems  Musculoskeletal:  Positive for back pain.       B/L shoulder pain ribs  All other systems reviewed and are negative.      Objective:   Physical Exam     Gen: no distress, normal appearing, very pleasant HEENT: oral mucosa pink and moist, NCAT Chest: normal effort, normal rate of breathing Abd: soft, non-distended Ext:  no edema Psych: Very pleasant, normal affect Skin: intact, warm and dry  Neuro: Follows commands,  she has an interpreter CN 2-12 grossly  intact Musculoskeletal:  Strength 5/5 in b/l UE 5/5 in b/l LE  Sensation intact to LT in all 4 extremities, both sides of hands and feet Tone normal b/l Bilateral trapezius and overall periscapular tenderness Left greater than right lower chest wall tenderness No signs of joint infection noted       Assessment & Plan:    Fibromyalgia, appears to be improved overall -MRI Cspine does not indicate likely spinal cause of overall pain -Continue low impact progressive exercise -Continue duloxetine 60mg  daily -Increase gabapentin to 300 mg 3 times daily -Consider yoga or tai chi -Will consider order of Zynax cryoheat device -Will schedule for trigger points for periscapular muscles -Advised to try Thera cane -PT if if doesn't improve  Chest wall pain.  Suspect costochondritis.  X-ray without evidence of fracture completed by Ortho care. -She reports minimal improvement with Mobic or ibuprofen -Will order lidocaine patch for chest wall   Lumbar back pain with radiculopathy, Lumbar disc degeneration -S/P ESI in past by Dr. Alvester Morin with improvement -May be contributing component to her overall pain -Continue medications as above   Mood disorder -No SI or HI -Patient says her mood is doing better   Knee pain -Mild OA noted on bilateral knee x-rays -Patient reports her knee pain is improved overall   Left ankle pain after fall -Noted to have left fibular fracture, she was referred to Ortho care and she says this is now healed.

## 2023-03-30 ENCOUNTER — Telehealth: Payer: Self-pay | Admitting: Nurse Practitioner

## 2023-03-30 NOTE — Telephone Encounter (Signed)
Disregard, patient has refill on file. Transferred to pharmacy.

## 2023-04-12 ENCOUNTER — Encounter: Payer: 59 | Attending: Physical Medicine & Rehabilitation | Admitting: Physical Medicine & Rehabilitation

## 2023-04-12 ENCOUNTER — Encounter: Payer: Self-pay | Admitting: Physical Medicine & Rehabilitation

## 2023-04-12 ENCOUNTER — Ambulatory Visit: Payer: 59 | Admitting: Physical Medicine & Rehabilitation

## 2023-04-12 VITALS — BP 137/88 | HR 111 | Ht 65.0 in | Wt 252.0 lb

## 2023-04-12 DIAGNOSIS — M797 Fibromyalgia: Secondary | ICD-10-CM | POA: Diagnosis not present

## 2023-04-12 DIAGNOSIS — F0630 Mood disorder due to known physiological condition, unspecified: Secondary | ICD-10-CM | POA: Diagnosis not present

## 2023-04-12 DIAGNOSIS — M545 Low back pain, unspecified: Secondary | ICD-10-CM | POA: Diagnosis not present

## 2023-04-12 MED ORDER — LIDOCAINE HCL 1 % IJ SOLN
10.0000 mL | Freq: Once | INTRAMUSCULAR | Status: AC
Start: 2023-04-12 — End: 2023-04-12
  Administered 2023-04-12: 10 mL

## 2023-04-12 MED ORDER — DULOXETINE HCL 60 MG PO CPEP: 60.0000 mg | ORAL_CAPSULE | Freq: Every day | ORAL | 3 refills | Status: DC

## 2023-04-12 MED ORDER — GABAPENTIN 300 MG PO CAPS: 300.0000 mg | ORAL_CAPSULE | Freq: Three times a day (TID) | ORAL | 3 refills | Status: DC

## 2023-04-12 NOTE — Progress Notes (Signed)
Subjective:    Patient ID: Candice [name], female    DOB: 1976/01/16, 47 y.o.   MRN: [id number]  HPI   Candice [name] is a 47 yo female with PMH of lumbar back pain with radiculopathy here for neck and shoulder pain.  She reports neck and back pain since 2015. Pain is tingling, stabbing and shooting. She also has pain in her legs worse on the left. Pain radiates to all her fingers and toes.  Bending worsens the pain.  Inactivity and sitting also worsens the pain.  She does not exercise and is not very active a this time.  She had PT with minimal benefit. She reports general fatigue and sleep disturbance,often hard to fall asleep. Prior ESI x2 reports that helped her leg pain.  She uses tylenol in the past with minimal benefit and stopped due to concerns it would damage her liver if used chronically.  Flexeril and duloxetine help improve her pain.  No bowel or bladder changes.   Visit 08/26/22 Candice [name] is here for her chronic fibromyalgia.  She is here with an interpreter today.  She reports that her pain is overall much better since using duloxetine and starting gabapentin 100 mg 3 times daily.  She reports some possible dry mouth that she thinks may be due to duloxetine.  She does not feel like this is significant enough to discontinue the medication.  She denies sedation with gabapentin.  She continues to have some pain in her mid and upper back bilaterally.  She also continues to have some pain in her knees.  Knee pain is worsened with walking and also with kneeling during prayer.  Her pain improves with activity.   Visit 09/22/21 Candice [name] is here with her interpreter regarding her pain.  She reports she had a fall where she twisted her ankle on the left side about 2 weeks ago.  Since this time she has been having pain in her left ankle.  Pain is worsened with ambulation and movement of her ankle.  She has not tried any new medications for it.  She feels the gabapentin has been improving her  overall body wide pain and has not been causing any sedation or other side effects.    Interval history 03/13/2023 Patient is here for follow-up regarding her chronic pain.  She is with her interpreter today.  Patient was followed by Ortho care for her left ankle fracture, she reports this is now healed and doing well.  Patient reports that her current medications of Cymbalta and gabapentin are helping reduce her pain overall.  She feels like her lower back pain is under control.  She does continue to have some discomfort in her periscapular muscles that is not controlled with these medications.  She also reports having some chest wall pain with activity mostly on the left side, was seen by Ortho care for this in April.  She had rib x-rays completed at [facility] without evidence of fractures.  Interval history 04/12/2023 Patient is here for trigger point injections to periscapular muscles.  She reports her pain is doing a little better in her back since last visit.  She continues to take Cymbalta and gabapentin and these are helping reduce her pain.   Pain Inventory Average Pain 10 Pain Right Now 10 My pain is intermittent, constant, sharp, and dull  In the last 24 hours, has pain interfered with the following? General activity 10 Relation with others 10 Enjoyment of life 10 What TIME  of day is your pain at its worst? daytime Sleep (in general) poor  Pain is worse with: walking, bending, sitting, standing, and some activites Pain improves with: medication Relief from Meds: 0  Family History  Problem Relation Age of Onset   Hypertension Father    High blood pressure Maternal Grandmother    Social History   Socioeconomic History   Marital status: Married    Spouse name: Not on file   Number of children: Not on file   Years of education: Not on file   Highest education level: Not on file  Occupational History   Not on file  Tobacco Use   Smoking status: Never   Smokeless  tobacco: Never  Vaping Use   Vaping status: Never Used  Substance and Sexual Activity   Alcohol use: No   Drug use: No   Sexual activity: Yes    Birth control/protection: None  Other Topics Concern   Not on file  Social History Narrative   Not on file   Social Determinants of Health   Financial Resource Strain: Not on file  Food Insecurity: Not on file  Transportation Needs: Not on file  Physical Activity: Not on file  Stress: Not on file  Social Connections: Not on file   Past Surgical History:  Procedure Laterality Date   CIRCUMCISION  as a young girl    Type III femake circumcision    DILATION AND CURETTAGE OF UTERUS  2006   HEMORRHOID SURGERY  2015   HEMORRHOID SURGERY     in [place]   Past Surgical History:  Procedure Laterality Date   CIRCUMCISION  as a young girl    Type III femake circumcision    DILATION AND CURETTAGE OF UTERUS  2006   HEMORRHOID SURGERY  2015   HEMORRHOID SURGERY     in [place]   Past Medical History:  Diagnosis Date   Hypertension    Irritable bowel syndrome (IBS)    Prediabetes    BP 137/88   Pulse (!) 111   Ht 5\' 5"  (1.651 m)   Wt 252 lb (114.3 kg)   SpO2 94%   BMI 41.93 kg/m   Opioid Risk Score:   Fall Risk Score:  `1  Depression screen [facility] 2/9     04/12/2023   11:23 AM 03/09/2023    3:05 PM 09/06/2022   11:00 AM 07/21/2022    9:16 AM 06/05/2022   11:33 AM 02/17/2022    1:17 PM 01/17/2022   10:21 AM  Depression screen PHQ 2/9  Decreased Interest 0 0 0 0 0 0 0  Down, Depressed, Hopeless 0 0 0 0 0 0 0  PHQ - 2 Score 0 0 0 0 0 0 0  Altered sleeping   0 0   1  Tired, decreased energy   0 0   0  Change in appetite   0 0   0  Feeling bad or failure about yourself    0 0   0  Trouble concentrating   0 0   1  Moving slowly or fidgety/restless   0 0   0  Suicidal thoughts   0 0   0  PHQ-9 Score   0 0   2     Review of Systems  Musculoskeletal:  Positive for back pain.       B/L shoulder pain ribs  All other systems  reviewed and are negative.      Objective:  Physical Exam     Gen: no distress, normal appearing, very pleasant HEENT: oral mucosa pink and moist, NCAT Chest: normal effort, normal rate of breathing Abd: soft, non-distended Ext: no edema Psych: Very pleasant, normal affect Skin: intact, warm and dry Neuro: Follows commands,  she has an interpreter CN 2-12 grossly  intact Musculoskeletal:  No focal motor deficits Sensation intact to LT in all 4 extremities Tone normal b/l Bilateral trapezius and overall periscapular tenderness No signs of joint infection noted Mild lumbar paraspinal tenderness       Assessment & Plan:    Fibromyalgia, appears to be improved overall -MRI Cspine does not indicate likely spinal cause of overall pain -Continue low impact progressive exercise -Continue duloxetine 60mg  daily -Increase gabapentin to 300 mg 3 times daily-patient has only been taking it at night.  She denies sedation with this medication.  Discussed trying this medicine 3 times a day -Consider yoga or tai chi -Will order Nexwave device -Advised to try Thera cane -PT if if doesn't improve -Shockwave therapy could be an option  Trigger Point Injection  Indication:  Myofascial pain not relieved by medication management and other conservative care.  Informed consent was obtained after describing risk and benefits of the procedure with the patient, this includes bleeding, bruising, infection and medication side effects.  The patient wishes to proceed and has given written consent.  The patient was placed in a seated position.  The trapezius and parascapular area was marked and prepped with Betadine.  It was entered with a 25-gauge 1-1/2 inch needle and 0.5 mL of 1% lidocaine was injected into each of 10 bilateral trigger points, after negative draw back for blood.  The patient tolerated the procedure well.  Post procedure instructions were given.   Chest wall pain.  Suspect  costochondritis.  X-ray without evidence of fracture completed by Ortho care. -She reports minimal improvement with Mobic or ibuprofen -Continue lidocaine patch   Lumbar back pain with radiculopathy, Lumbar disc degeneration -S/P ESI in past by Dr. Alvester Morin with improvement -May be contributing component to her overall pain -Continue medications as above   Mood disorder -No SI or HI -Patient reports her mood is stable overall -Continue duloxetine   Knee pain -Mild OA noted on bilateral knee x-rays -Patient reports her knee pain is improved overall   Left ankle pain after fall -Noted to have left fibular fracture, she was referred to Ortho care and she says this is now healed.

## 2023-04-26 DIAGNOSIS — E2839 Other primary ovarian failure: Secondary | ICD-10-CM | POA: Diagnosis not present

## 2023-04-26 DIAGNOSIS — Z319 Encounter for procreative management, unspecified: Secondary | ICD-10-CM | POA: Diagnosis not present

## 2023-04-30 ENCOUNTER — Other Ambulatory Visit: Payer: Self-pay | Admitting: Physician Assistant

## 2023-05-13 ENCOUNTER — Other Ambulatory Visit: Payer: Self-pay | Admitting: Family Medicine

## 2023-05-13 DIAGNOSIS — I10 Essential (primary) hypertension: Secondary | ICD-10-CM

## 2023-05-22 ENCOUNTER — Ambulatory Visit: Payer: BLUE CROSS/BLUE SHIELD | Admitting: Nurse Practitioner

## 2023-06-25 ENCOUNTER — Encounter: Payer: Self-pay | Admitting: Nurse Practitioner

## 2023-06-25 ENCOUNTER — Ambulatory Visit: Payer: 59 | Attending: Nurse Practitioner | Admitting: Nurse Practitioner

## 2023-06-25 VITALS — BP 162/98 | HR 110 | Wt 246.4 lb

## 2023-06-25 DIAGNOSIS — E1169 Type 2 diabetes mellitus with other specified complication: Secondary | ICD-10-CM | POA: Diagnosis not present

## 2023-06-25 DIAGNOSIS — Z7984 Long term (current) use of oral hypoglycemic drugs: Secondary | ICD-10-CM

## 2023-06-25 DIAGNOSIS — E7800 Pure hypercholesterolemia, unspecified: Secondary | ICD-10-CM

## 2023-06-25 DIAGNOSIS — B359 Dermatophytosis, unspecified: Secondary | ICD-10-CM

## 2023-06-25 DIAGNOSIS — Z124 Encounter for screening for malignant neoplasm of cervix: Secondary | ICD-10-CM

## 2023-06-25 DIAGNOSIS — R748 Abnormal levels of other serum enzymes: Secondary | ICD-10-CM | POA: Diagnosis not present

## 2023-06-25 DIAGNOSIS — Z6841 Body Mass Index (BMI) 40.0 and over, adult: Secondary | ICD-10-CM

## 2023-06-25 DIAGNOSIS — E669 Obesity, unspecified: Secondary | ICD-10-CM | POA: Diagnosis not present

## 2023-06-25 DIAGNOSIS — E1165 Type 2 diabetes mellitus with hyperglycemia: Secondary | ICD-10-CM

## 2023-06-25 LAB — GLUCOSE, POCT (MANUAL RESULT ENTRY): POC Glucose: 236 mg/dL — AB (ref 70–99)

## 2023-06-25 LAB — POCT GLYCOSYLATED HEMOGLOBIN (HGB A1C): HbA1c, POC (controlled diabetic range): 8 % — AB (ref 0.0–7.0)

## 2023-06-25 MED ORDER — CLOTRIMAZOLE 1 % EX CREA: 1.0000 | TOPICAL_CREAM | Freq: Two times a day (BID) | CUTANEOUS | 3 refills | Status: DC

## 2023-06-25 MED ORDER — METFORMIN HCL 500 MG PO TABS
1000.0000 mg | ORAL_TABLET | Freq: Two times a day (BID) | ORAL | 1 refills | Status: DC
Start: 2023-06-25 — End: 2024-01-01

## 2023-06-25 NOTE — Progress Notes (Signed)
Assessment & Plan:  Candice [name] was seen today for medical management of chronic issues.  Diagnoses and all orders for this visit:  Type 2 diabetes mellitus with hyperglycemia, without long-term current use of insulin  Increase metformin to 2 tablets BID -     POCT glucose (manual entry) -     POCT glycosylated hemoglobin (Hb A1C) -     CMP14+EGFR -     metFORMIN (GLUCOPHAGE) 500 MG tablet; Take 2 tablets (1,000 mg total) by mouth 2 (two) times daily with a meal.  Tinea -     clotrimazole (LOTRIMIN) 1 % cream; Apply 1 Application topically 2 (two) times daily.  Elevated liver enzymes -     CMP14+EGFR  Hypercholesterolemia -     Lipid panel    Patient has been counseled on age-appropriate routine health concerns for screening and prevention. These are reviewed and up-to-date. Referrals have been placed accordingly. Immunizations are up-to-date or declined.    Subjective:   Chief Complaint  Patient presents with   Medical Management of Chronic Issues    Itching in groin area    HPI Candice [name] 47 y.o. female presents to office today for follow up to DM and HTN    VRI was used to communicate directly with patient for the entire encounter including providing detailed patient instructions.    DM 2 She is not dietary adherent. Had bread and jam this morning. Has not been taking trulicity as prescribed. States she feels swollen after she takes it so she has been taking it only a few times very few months.  A1c steadily increasing and not at goal. She does not want to take any injectables  Lab Results  Component Value Date   HGBA1C 8.0 (A) 06/25/2023     HTN She had fried fish last night. Blood pressure is elevated today. She is not dietary or exercise adherent.  BP Readings from Last 3 Encounters:  06/25/23 (!) 162/98  04/12/23 137/88  03/09/23 (!) 137/95    GU  Notes macular moist pruritic rash in bilateral groin area and under left breast. Has been using OTC anti itch  cream with no relief.     Review of Systems  Constitutional:  Negative for fever, malaise/fatigue and weight loss.  HENT: Negative.  Negative for nosebleeds.   Eyes: Negative.  Negative for blurred vision, double vision and photophobia.  Respiratory: Negative.  Negative for cough and shortness of breath.   Cardiovascular: Negative.  Negative for chest pain, palpitations and leg swelling.  Gastrointestinal: Negative.  Negative for heartburn, nausea and vomiting.  Musculoskeletal: Negative.  Negative for myalgias.  Skin:  Positive for itching and rash.  Neurological: Negative.  Negative for dizziness, focal weakness, seizures and headaches.  Psychiatric/Behavioral: Negative.  Negative for suicidal ideas.     Past Medical History:  Diagnosis Date   Hypertension    Irritable bowel syndrome (IBS)    Prediabetes     Past Surgical History:  Procedure Laterality Date   CIRCUMCISION  as a young girl    Type III femake circumcision    DILATION AND CURETTAGE OF UTERUS  2006   HEMORRHOID SURGERY  2015   HE[place]ID [place]GERY     in [place]    Family History  Problem Relation Age of Onset   Hypertension Father    High blood pressure Maternal Grandmother     Social History Reviewed with no changes to be made today.   Outpatient Medications Prior to Visit  Medication  Sig Dispense Refill   amLODipine (NORVASC) 2.5 MG tablet TAKE 1 TABLET(2.5 MG) BY MOUTH DAILY 90 tablet 0   Blood Glucose Monitoring Suppl (ONETOUCH VERIO REFLECT) w/Device KIT use daily as directed 1 kit 0   diclofenac Sodium (VOLTAREN) 1 % GEL Apply 4 g topically 4 (four) times daily. 150 g 4   DULoxetine (CYMBALTA) 60 MG capsule Take 1 capsule (60 mg total) by mouth daily. 90 capsule 3   fluticasone (FLONASE) 50 MCG/ACT nasal spray Place 2 sprays into both nostrils daily. 16 g 6   gabapentin (NEURONTIN) 300 MG capsule Take 1 capsule (300 mg total) by mouth 3 (three) times daily. 90 capsule 3   glucose blood (ONETOUCH  VERIO) test strip Use to check blood sugar 3-4 times daily. E11.69 100 each 2   metFORMIN (GLUCOPHAGE) 500 MG tablet Take 1-2 tablets (500-1,000 mg total) by mouth 2 (two) times daily with a meal. 180 tablet 3   hydrocortisone-pramoxine (ANALPRAM-HC) 2.5-1 % rectal cream Insert 1 application rectally 3 (three) times daily. 60 g 0   ibuprofen (ADVIL) 600 MG tablet Take 1 tablet (600 mg total) by mouth every 6 (six) hours as needed. 30 tablet 0   lidocaine 4 % Place 1 patch onto the skin daily. 30 patch 4   loratadine (CLARITIN) 10 MG tablet Take 1 tablet (10 mg total) by mouth daily. 90 tablet 1   meloxicam (MOBIC) 7.5 MG tablet Take 1 tablet (7.5 mg total) by mouth daily. 30 tablet 1   miconazole (MICONAZOLE 7) 2 % vaginal cream Place 1 Applicatorful vaginally at bedtime. 45 g 1   TRUEplus Lancets 28G MISC Use as instructed. Check blood glucose level by fingerstick three times per day. 200 each 3   No facility-administered medications prior to visit.    No Known Allergies     Objective:    BP (!) 162/98 (BP Location: Left Arm, Patient Position: Sitting, Cuff Size: Normal) Comment (BP Location): lower arm  Pulse (!) 110   Wt 246 lb 6.4 oz (111.8 kg)   SpO2 91%   BMI 41.00 kg/m  Wt Readings from Last 3 Encounters:  06/25/23 246 lb 6.4 oz (111.8 kg)  04/12/23 252 lb (114.3 kg)  03/09/23 247 lb (112 kg)    Physical Exam Vitals and nursing note reviewed.  Constitutional:      Appearance: She is well-developed.  HENT:     Head: Normocephalic and atraumatic.  Cardiovascular:     Rate and Rhythm: Normal rate and regular rhythm.     Heart sounds: Normal heart sounds. No murmur heard.    No friction rub. No gallop.  Pulmonary:     Effort: Pulmonary effort is normal. No tachypnea or respiratory distress.     Breath sounds: Normal breath sounds. No decreased breath sounds, wheezing, rhonchi or rales.  Chest:     Chest wall: No tenderness.  Abdominal:     General: Bowel sounds are  normal.     Palpations: Abdomen is soft.  Musculoskeletal:        General: Normal range of motion.     Cervical back: Normal range of motion.  Skin:    General: Skin is warm and dry.     Comments: Candidiasis rash under left breast.  Neurological:     Mental Status: She is alert and oriented to person, place, and time.     Coordination: Coordination normal.  Psychiatric:        Behavior: Behavior normal. Behavior is cooperative.  Thought Content: Thought content normal.        Judgment: Judgment normal.          Patient has been counseled extensively about nutrition and exercise as well as the importance of adherence with medications and regular follow-up. The patient was given clear instructions to go to ER or return to medical center if symptoms don't improve, worsen or new problems develop. The patient verbalized understanding.   Follow-up: Return in about 3 months (around 09/25/2023).   Claiborne Rigg, FNP-BC [facility] and [facility] [place], [place] [phone]   06/25/2023, 11:10 AM

## 2023-06-25 NOTE — Patient Instructions (Signed)
Sent  Referral to  [facility] [address], [place] [address] [phone] Valinda Hoar)

## 2023-06-25 NOTE — Progress Notes (Signed)
Candice [name] #[id number]

## 2023-06-26 LAB — CMP14+EGFR
ALT: 23 [IU]/L (ref 0–32)
AST: 21 [IU]/L (ref 0–40)
Albumin: 3.6 g/dL — ABNORMAL LOW (ref 3.9–4.9)
Alkaline Phosphatase: 175 [IU]/L — ABNORMAL HIGH (ref 44–121)
BUN/Creatinine Ratio: 21 (ref 9–23)
BUN: 13 mg/dL (ref 6–24)
Bilirubin Total: <0.2 mg/dL (ref 0.0–1.2)
CO2: 24 mmol/L (ref 20–29)
Calcium: 9.1 mg/dL (ref 8.7–10.2)
Chloride: 99 mmol/L (ref 96–106)
Creatinine, Ser: 0.61 mg/dL (ref 0.57–1.00)
Globulin, Total: 2.9 g/dL (ref 1.5–4.5)
Glucose: 262 mg/dL — ABNORMAL HIGH (ref 70–99)
Potassium: 3.8 mmol/L (ref 3.5–5.2)
Sodium: 140 mmol/L (ref 134–144)
Total Protein: 6.5 g/dL (ref 6.0–8.5)
eGFR: 113 mL/min/{1.73_m2} (ref >59)

## 2023-06-26 LAB — LIPID PANEL
Chol/HDL Ratio: 3.7 {ratio} (ref 0.0–4.4)
Cholesterol, Total: 218 mg/dL — ABNORMAL HIGH (ref 100–199)
HDL: 59 mg/dL (ref >39)
LDL Chol Calc (NIH): 125 mg/dL — ABNORMAL HIGH (ref 0–99)
Triglycerides: 194 mg/dL — ABNORMAL HIGH (ref 0–149)
VLDL Cholesterol Cal: 34 mg/dL (ref 5–40)

## 2023-07-03 ENCOUNTER — Other Ambulatory Visit: Payer: Self-pay

## 2023-07-03 ENCOUNTER — Encounter: Payer: Self-pay | Admitting: Physician Assistant

## 2023-07-03 ENCOUNTER — Ambulatory Visit: Payer: 59 | Admitting: Physician Assistant

## 2023-07-03 DIAGNOSIS — M79641 Pain in right hand: Secondary | ICD-10-CM | POA: Insufficient documentation

## 2023-07-03 DIAGNOSIS — S62646A Nondisplaced fracture of proximal phalanx of right little finger, initial encounter for closed fracture: Secondary | ICD-10-CM | POA: Diagnosis not present

## 2023-07-03 MED ORDER — HYDROCODONE-ACETAMINOPHEN 5-325 MG PO TABS: 1.0000 | ORAL_TABLET | Freq: Three times a day (TID) | ORAL | 0 refills | Status: DC | PRN

## 2023-07-03 NOTE — Progress Notes (Signed)
Office Visit Note   Patient: Candice [name]           Date of Birth: Dec 23, 1975           MRN: [id number] Visit Date: 07/03/2023              Requested by: Claiborne Rigg, NP [address] [place] [address] [place],  [place] [address] PCP: Claiborne Rigg, NP   Assessment & Plan: Visit Diagnoses:  1. Pain in right hand     Plan: Patient is a pleasant 47 year old woman who is seen with the assistance of an interpreter today.  She is 1 day status post falling onto her right side.  She denies any loss of consciousness any dizziness any and nausea.  She is here today with a complaint of right hand pain.  Specifically she has swelling and pain at the proximal phalanx of the short finger.  She is also very tender to palpation.  She has some rib pain but does not have any shortness of breath it just hurts when she takes in a deep breath.  Denies any neck pain any paresthesias.  X-rays of her hand today do demonstrate a spiral nondisplaced fracture of the proximal phalanx of the right fifth finger.  Will put her in a removable splint but she knows she is to wear this all the time except when she is washing her hands.  Should avoid using this hand is much as possible can use ice Tylenol and meloxicam.  I also called her in a small amount of Norco.  She will follow-up in 2 weeks at which time x-rays of her hand should be taken  Follow-Up Instructions: No follow-ups on file.   Orders:  No orders of the defined types were placed in this encounter.  No orders of the defined types were placed in this encounter.     Procedures: No procedures performed   Clinical Data: No additional findings.   Subjective: No chief complaint on file.   HPI patient is a pleasant 47 year old woman who comes in with a chief complaint of right hand pain.  She said she fell backward yesterday she is unsure how her right hand or arm was involved but she is having pain that is worse today.  Pain is in the right fifth  finger and into the hand.  Swelling is also increased.  She does have some rib pain but no shortness of breath she is just sore.  She is taking meloxicam to ease the pain she is right-hand dominant  Review of Systems  All other systems reviewed and are negative.    Objective: Vital Signs: There were no vitals taken for this visit.  Physical Exam Constitutional:      Appearance: Normal appearance.  Pulmonary:     Effort: Pulmonary effort is normal.  Skin:    General: Skin is warm and dry.  Neurological:     General: No focal deficit present.     Mental Status: She is alert and oriented to person, place, and time.  Psychiatric:        Mood and Affect: Mood normal.        Behavior: Behavior normal.     Ortho Exam Examination of her right hand she is neurovascularly intact she has a strong pulse no redness no skin breakdown she has no tenderness is focused in the proximal phalanx of the fifth finger.  She is able to extend and flex the finger though limited  because of pain and swelling.  She has brisk capillary refill she just has some generalized soft tissue swelling throughout the rest of the hand can extend and flex her wrist and no pain in her wrist Specialty Comments:  MRI LUMBAR SPINE WITHOUT CONTRAST   TECHNIQUE: Multiplanar, multisequence MR imaging of the lumbar spine was performed. No intravenous contrast was administered.   COMPARISON:  Radiography 06/02/2021.  MRI 02/16/2015.   FINDINGS: Segmentation:  5 lumbar type vertebral bodies.   Alignment:  Minimal scoliotic curvature convex to the right.   Vertebrae:  No fracture or focal lesion.   Conus medullaris and cauda equina: Conus extends to the T12-L1 level. Conus and cauda equina appear normal.   Paraspinal and other soft tissues: Negative   Disc levels:   No abnormality at L2-3 or above.   L3-4: Mild bulging of the disc. No compressive stenosis. No change since 08-12-2015.   L4-5: Disc degeneration with  loss of disc height. Chronic shallow disc protrusion with slight caudal down turning. Slight indentation of the thecal sac and mild stenosis of the lateral recesses but without definite neural compression. No progressive change since 08/12/2015. The disc material has desiccated slightly.   L5-S1: Disc degeneration with bulging of the disc and a shallow protrusion towards the left. Mild facet and ligamentous hypertrophy. Stenosis of the subarticular lateral recess on the left that could possibly affect the left S1 nerve. The left subarticular lateral recess stenosis may be slightly more pronounced than was seen previously. The disc material has desiccated slightly.   IMPRESSION: L3-4: Minor, non-compressive disc bulge, unchanged.   L4-5: Shallow disc protrusion with slight caudal down turning. Slight indentation of the thecal sac and mild stenosis of both lateral recesses but no definite neural compression. No worsening since 08/12/15. The disc material has desiccated slightly.   L5-S1: Shallow disc protrusion slightly more prominent towards the left. Stenosis of the subarticular lateral recess on the left that could possibly affect the left S1 nerve. The subarticular lateral recess narrowing may be slightly more pronounced than was seen in 08/12/2015. The disc material at this level has also desiccated slightly.     Electronically Signed   By: Paulina Fusi M.D.   On: 06/07/2021 08:06  Imaging: No results found.   PMFS History: Patient Active Problem List   Diagnosis Date Noted   Pain in right hand 07/03/2023   Rib pain on left side 01/03/2023   Closed avulsion fracture of distal fibula, left, initial encounter 09/28/2022   Diarrhea of presumed infectious origin 09/06/2022   Type 2 diabetes mellitus with obesity (HCC) 09/06/2022   Snoring 01/18/2022   Prediabetes 04/23/2018   History of gastroesophageal reflux (GERD) 08/03/2017   History of vitamin D deficiency 08/03/2017   History of  Helicobacter pylori infection 08/03/2017   History of urinary tract infection 08/03/2017   Asymptomatic microscopic hematuria 08/03/2017   Chronic abdominal pain 08/03/2017   Left lower quadrant pain 06/05/2016   Hemorrhoid 04/06/2016   Body mass index (BMI) 40.0-44.9, adult (HCC) 10/28/2015   Neck pain 10/28/2015   Shoulder pain, bilateral 10/28/2015   Vertigo 07/29/2015   Tachycardia 07/08/2015   Vitamin D deficiency 04/08/2015   Lumbar back pain with radiculopathy affecting left lower extremity 04/07/2015   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   Chronic pain of multiple joints 01/29/2015   Irregular menses 10/01/2014   Allergic rhinitis 10/01/2014   Chalazion of left upper eyelid 10/01/2014   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  Past Medical History:  Diagnosis Date   Hypertension    Irritable bowel syndrome (IBS)    Prediabetes     Family History  Problem Relation Age of Onset   Hypertension Father    High blood pressure Maternal Grandmother     Past Surgical History:  Procedure Laterality Date   CIRCUMCISION  as a young girl    Type III femake circumcision    DILATION AND CURETTAGE OF UTERUS  2006   HEMORRHOID SURGERY  2015   HEMORRHOID SURGERY     in [place]   Social History   Occupational History   Not on file  Tobacco Use   Smoking status: Never   Smokeless tobacco: Never  Vaping Use   Vaping status: Never Used  Substance and Sexual Activity   Alcohol use: No   Drug use: No   Sexual activity: Yes    Birth control/protection: None

## 2023-07-09 ENCOUNTER — Other Ambulatory Visit: Payer: Self-pay | Admitting: Nurse Practitioner

## 2023-07-09 DIAGNOSIS — E7800 Pure hypercholesterolemia, unspecified: Secondary | ICD-10-CM

## 2023-07-09 IMAGING — MR MR LUMBAR SPINE W/O CM
4 of 5 series · 27 of 48 positions shown · non-contrast
Comparison: Radiography 06/02/2021.  MRI 02/16/2015.

CLINICAL DATA: Low back pain for greater than 6 weeks which is
progressive. Pain worse on the right than the left with lower
extremity radiculopathy.

EXAM:
MRI LUMBAR SPINE WITHOUT CONTRAST
TECHNIQUE: Multiplanar, multisequence MR imaging of the lumbar spine was
performed. No intravenous contrast was administered.

[Series 3: T2 · sagittal · 4.0mm · 1.09mm/px · 6 of 15 slices shown (1 of 2)]
[im 1/15]
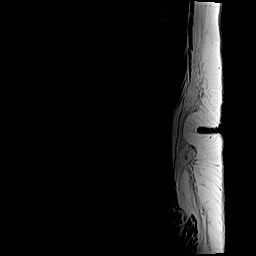
[im 3/15]
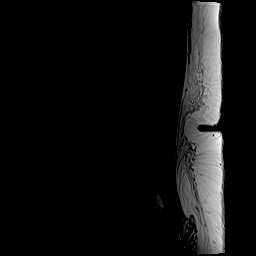
[im 6/15]
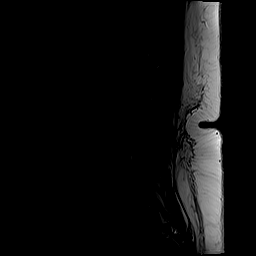
[im 9/15]
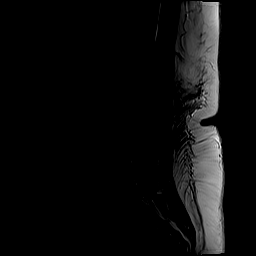
[im 12/15]
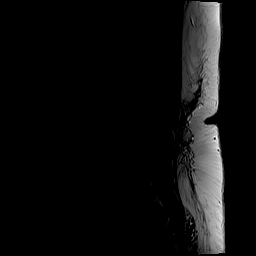
[im 15/15]
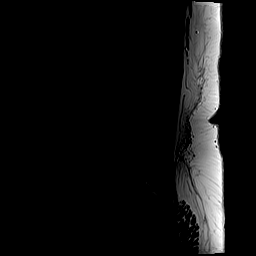

[Series 5: T1 · sagittal · 4.0mm · 1.09mm/px · 6 of 15 slices shown (1 of 2)]
[im 1/15]
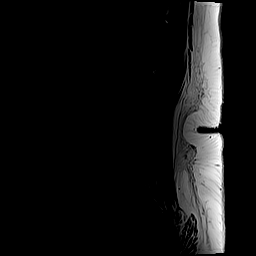
[im 3/15]
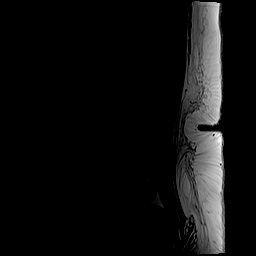
[im 6/15]
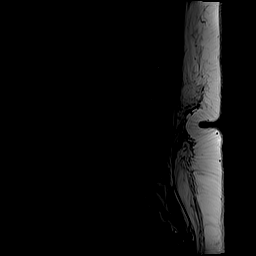
[im 9/15]
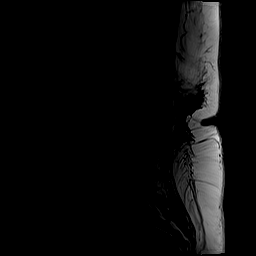
[im 12/15]
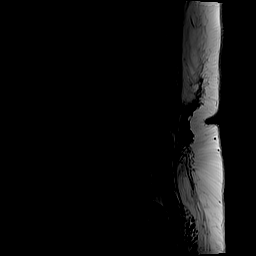
[im 15/15]
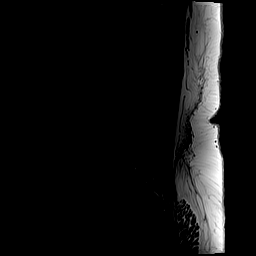

[Series 6: T2 · axial · 4.0mm · 0.39mm/px · z∈[-68,+152]mm · 9 of 38 slices shown (2 of 2)]
[im 1/38]
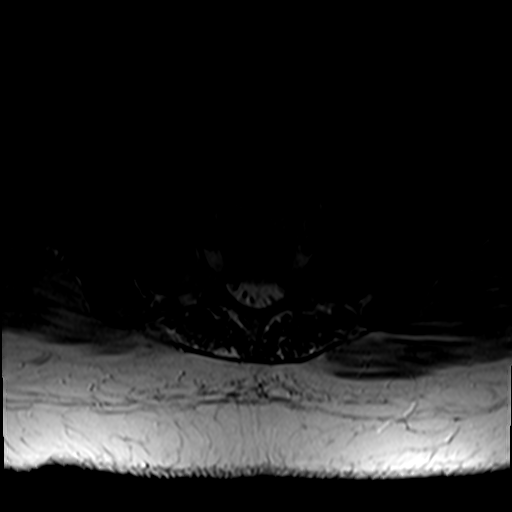
[im 6/38]
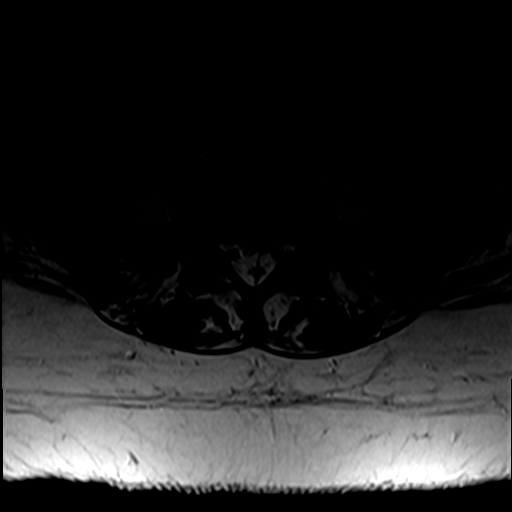
[im 11/38]
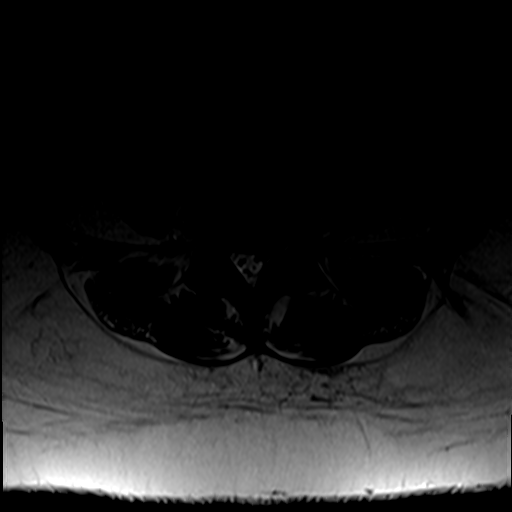
[im 16/38]
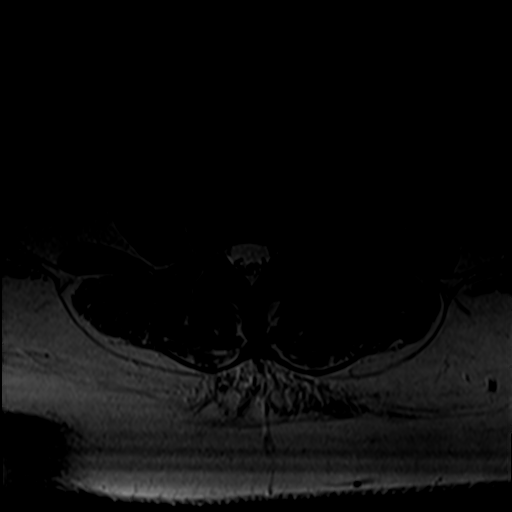
[im 19/38]
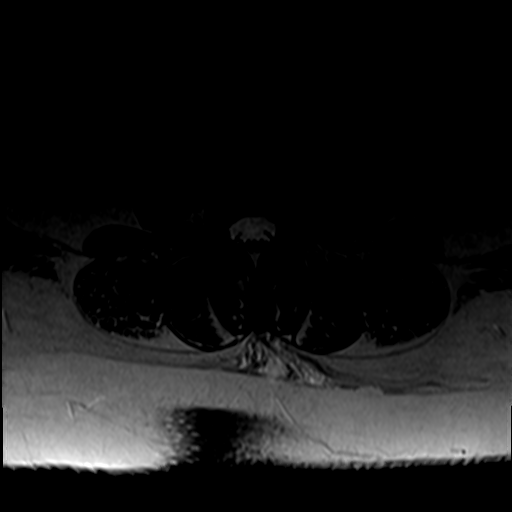
[im 22/38]
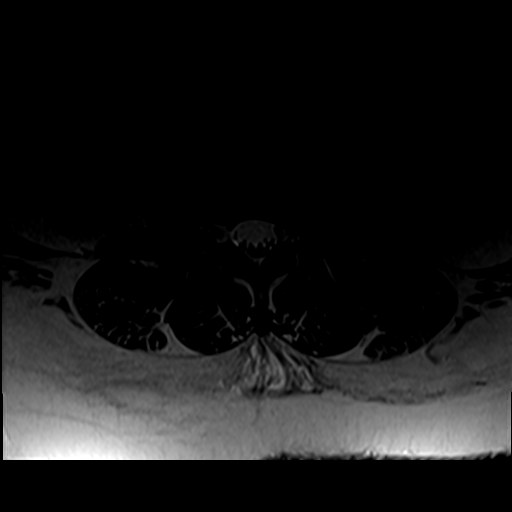
[im 27/38]
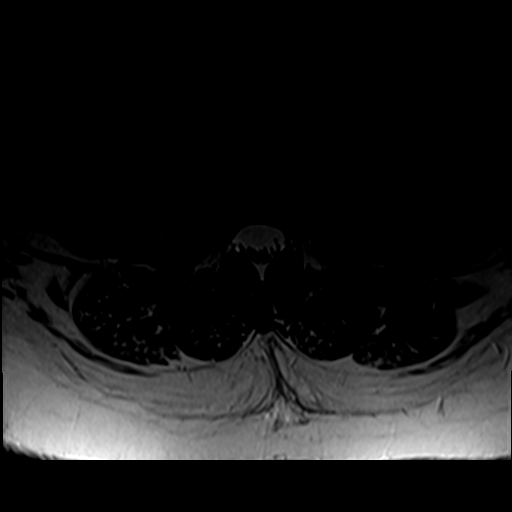
[im 32/38]
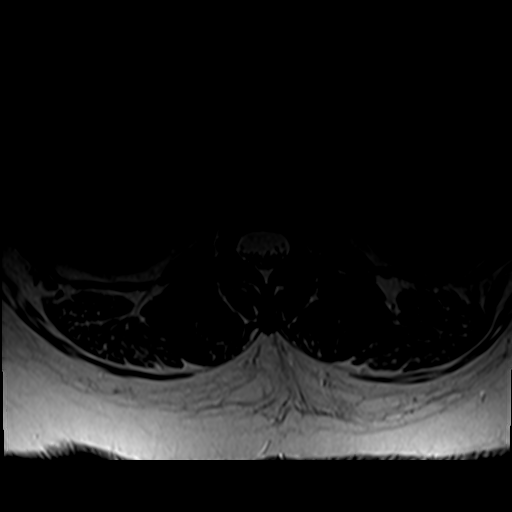
[im 38/38]
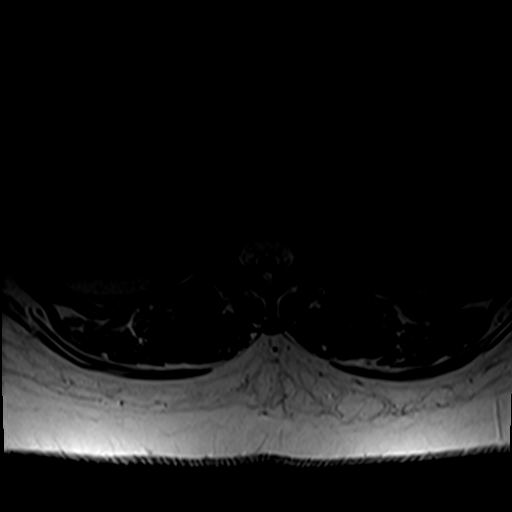

[Series 7: T1 · axial · 4.0mm · 0.39mm/px · z∈[-68,+124]mm · 6 of 38 slices shown (2 of 2)]
[im 1/38]
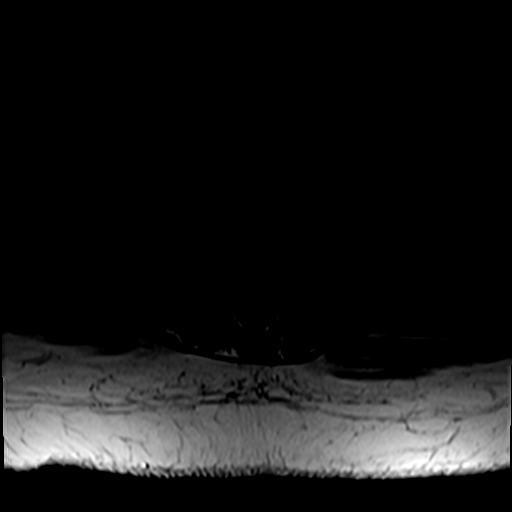
[im 6/38]
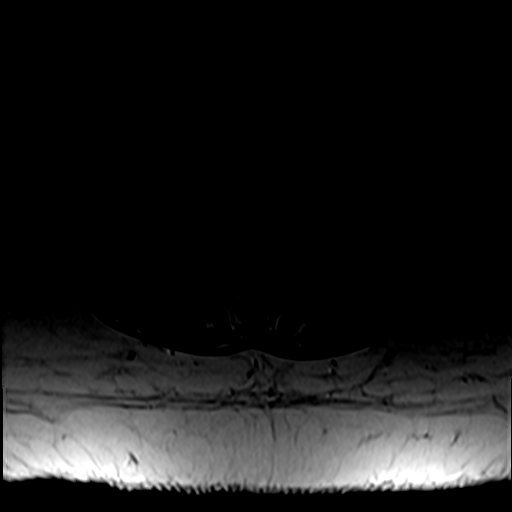
[im 11/38]
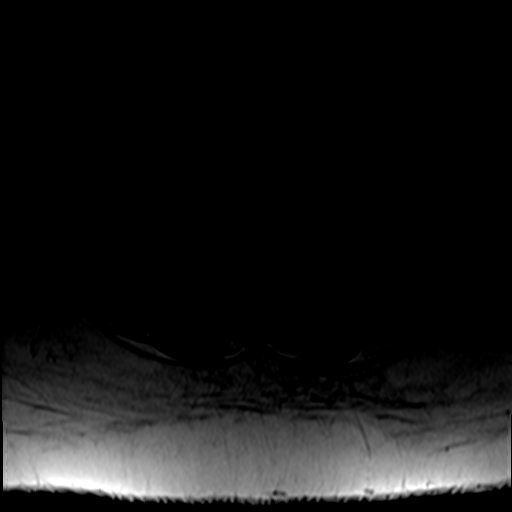
[im 16/38]
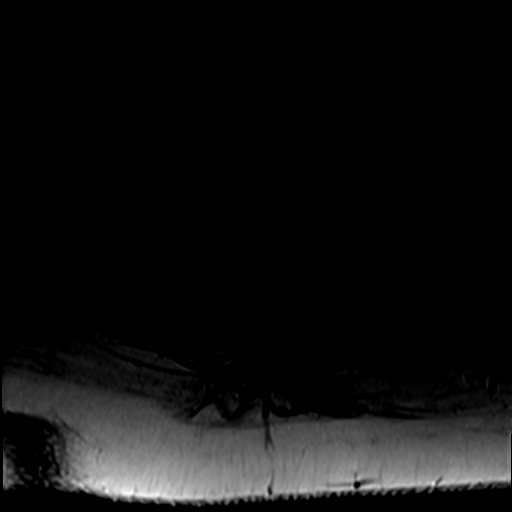
[im 19/38]
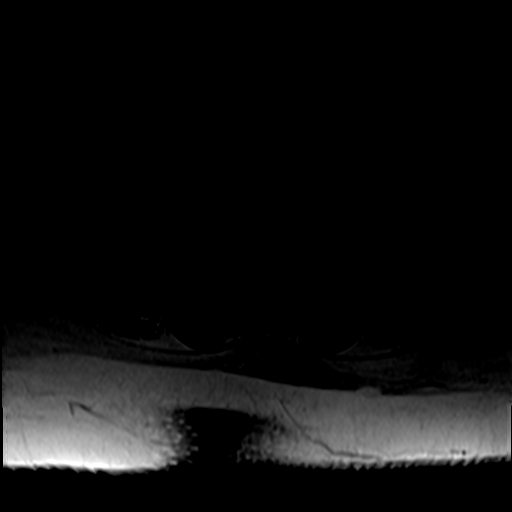
[im 32/38]
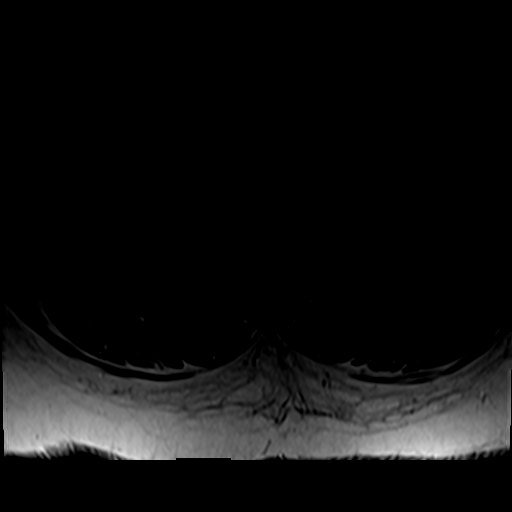

[27 of 48 positions shown; findings below may reference images not displayed]

FINDINGS: Segmentation:  5 lumbar type vertebral bodies.

Alignment:  Minimal scoliotic curvature convex to the right.

Vertebrae:  No fracture or focal lesion.

Conus medullaris and cauda equina: Conus extends to the T12-L1
level. Conus and cauda equina appear normal.

Paraspinal and other soft tissues: Negative

Disc levels:

No abnormality at L2-3 or above.

L3-4: Mild bulging of the disc. No compressive stenosis. No change
since 4434.

L4-5: Disc degeneration with loss of disc height. Chronic shallow
disc protrusion with slight caudal down turning. Slight indentation
of the thecal sac and mild stenosis of the lateral recesses but
without definite neural compression. No progressive change since
4434. The disc material has desiccated slightly.

L5-S1: Disc degeneration with bulging of the disc and a shallow
protrusion towards the left. Mild facet and ligamentous hypertrophy.
Stenosis of the subarticular lateral recess on the left that could
possibly affect the left S1 nerve. The left subarticular lateral
recess stenosis may be slightly more pronounced than was seen
previously. The disc material has desiccated slightly.
IMPRESSION: L3-4: Minor, non-compressive disc bulge, unchanged.

L4-5: Shallow disc protrusion with slight caudal down turning.
Slight indentation of the thecal sac and mild stenosis of both
lateral recesses but no definite neural compression. No worsening
since 4434. The disc material has desiccated slightly.

L5-S1: Shallow disc protrusion slightly more prominent towards the
left. Stenosis of the subarticular lateral recess on the left that
could possibly affect the left S1 nerve. The subarticular lateral
recess narrowing may be slightly more pronounced than was seen in
4434. The disc material at this level has also desiccated slightly.

## 2023-07-09 MED ORDER — ATORVASTATIN CALCIUM 40 MG PO TABS
40.0000 mg | ORAL_TABLET | Freq: Every day | ORAL | 3 refills | Status: DC
Start: 2023-07-09 — End: 2024-03-26

## 2023-07-13 ENCOUNTER — Ambulatory Visit: Payer: 59 | Admitting: Physical Medicine & Rehabilitation

## 2023-07-17 ENCOUNTER — Ambulatory Visit (INDEPENDENT_AMBULATORY_CARE_PROVIDER_SITE_OTHER): Payer: 59 | Admitting: Physician Assistant

## 2023-07-17 ENCOUNTER — Encounter: Payer: Self-pay | Admitting: Physician Assistant

## 2023-07-17 ENCOUNTER — Other Ambulatory Visit (INDEPENDENT_AMBULATORY_CARE_PROVIDER_SITE_OTHER): Payer: 59

## 2023-07-17 DIAGNOSIS — G8929 Other chronic pain: Secondary | ICD-10-CM | POA: Diagnosis not present

## 2023-07-17 DIAGNOSIS — M546 Pain in thoracic spine: Secondary | ICD-10-CM | POA: Diagnosis not present

## 2023-07-17 DIAGNOSIS — M79641 Pain in right hand: Secondary | ICD-10-CM | POA: Diagnosis not present

## 2023-07-17 NOTE — Progress Notes (Signed)
Office Visit Note   Patient: Candice [name]           Date of Birth: [birth year]           MRN: [id number] Visit Date: 07/17/2023              Requested by: Claiborne Rigg, NP [address] [place] [address] [place],  [place] [address] PCP: Claiborne Rigg, NP  Chief Complaint  Patient presents with   Right Hand - Pain, Follow-up      HPI: Patient is now 2 weeks status post nondisplaced spiral fracture of the right fifth proximal phalanx of the short finger.  She has been immobilized.  She is here for follow-up.  Assessment & Plan: Visit Diagnoses:  1. Pain in right hand   2. Chronic thoracic back pain, unspecified back pain laterality     Plan: 2 weeks status post nondisplaced fracture of the proximal phalanx of the right short finger.  She is doing well.  She really has little pain.  I would like her to protect this and not use it for lifting anything heavy.  She understands the fracture stable but not healed yet.  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  This is causing her quite a bit of symptoms.  Will refer her to reconstructive surgery to see if she is a candidate follow-up in 3 weeks with new x-rays of her hand  Follow-Up Instructions: Return in about 3 weeks (around 08/07/2023).   Ortho Exam  Patient is alert, oriented, no adenopathy, well-dressed, normal affect, normal respiratory effort. Right hand she is minimal swelling good radial pulses hand is warm with brisk capillary refill compartments are soft and compressible she has pain to deep palpation over the proximal phalanx of the fifth finger  Imaging: XR Hand Complete Right  Result Date: 07/17/2023 Radiographs of the right hand demonstrate nondisplaced spiral fracture of the proximal phalanx of the short finger.  Remains aligned in anatomic position.  No other acute changes  No images are attached to the encounter.  Labs: Lab Results  Component Value Date   HGBA1C 8.0 (A) 06/25/2023    HGBA1C 7.8 (A) 02/15/2023   HGBA1C 6.8 09/06/2022   ESRSEDRATE 28 (H) 05/04/2016     Lab Results  Component Value Date   ALBUMIN 3.6 (L) 06/25/2023   ALBUMIN 3.8 (L) 02/22/2023   ALBUMIN 3.7 (L) 02/15/2023    No results found for: "MG" Lab Results  Component Value Date   VD25OH 39.4 08/03/2017   VD25OH 18 (L) 08/27/2015   VD25OH 16 (L) 04/07/2015    No results found for: "PREALBUMIN"    Latest Ref Rng & Units 02/15/2023    2:37 PM 09/06/2022   10:46 AM 09/30/2021   11:23 AM  CBC EXTENDED  WBC 3.4 - 10.8 x10E3/uL 9.4  10.2  8.8   RBC 3.77 - 5.28 x10E6/uL 5.07  5.08  4.55   Hemoglobin 11.1 - 15.9 g/dL 62.1  30.8  65.7   HCT 34.0 - 46.6 % 41.3  41.0  36.6   Platelets 150 - 450 x10E3/uL 345  384  394   NEUT# 1.4 - 7.0 x10E3/uL 6.9  7.0  6.0   Lymph# 0.7 - 3.1 x10E3/uL 1.8  2.4  2.0      There is no height or weight on file to calculate BMI.  Orders:  Orders Placed This Encounter  Procedures   XR Hand Complete Right   Ambulatory  referral to Plastic Surgery   No orders of the defined types were placed in this encounter.    Procedures: No procedures performed  Clinical Data: No additional findings.  ROS:  All other systems negative, except as noted in the HPI. Review of Systems  Objective: Vital Signs: There were no vitals taken for this visit.  Specialty Comments:  MRI LUMBAR SPINE WITHOUT CONTRAST   TECHNIQUE: Multiplanar, multisequence MR imaging of the lumbar spine was performed. No intravenous contrast was administered.   COMPARISON:  Radiography 06/02/2021.  MRI 02/16/2015.   FINDINGS: Segmentation:  5 lumbar type vertebral bodies.   Alignment:  Minimal scoliotic curvature convex to the right.   Vertebrae:  No fracture or focal lesion.   Conus medullaris and cauda equina: Conus extends to the T12-L1 level. Conus and cauda equina appear normal.   Paraspinal and other soft tissues: Negative   Disc levels:   No abnormality at L2-3 or  above.   L3-4: Mild bulging of the disc. No compressive stenosis. No change since 08/17/15.   L4-5: Disc degeneration with loss of disc height. Chronic shallow disc protrusion with slight caudal down turning. Slight indentation of the thecal sac and mild stenosis of the lateral recesses but without definite neural compression. No progressive change since August 17, 2015. The disc material has desiccated slightly.   L5-S1: Disc degeneration with bulging of the disc and a shallow protrusion towards the left. Mild facet and ligamentous hypertrophy. Stenosis of the subarticular lateral recess on the left that could possibly affect the left S1 nerve. The left subarticular lateral recess stenosis may be slightly more pronounced than was seen previously. The disc material has desiccated slightly.   IMPRESSION: L3-4: Minor, non-compressive disc bulge, unchanged.   L4-5: Shallow disc protrusion with slight caudal down turning. Slight indentation of the thecal sac and mild stenosis of both lateral recesses but no definite neural compression. No worsening since 08/17/2015. The disc material has desiccated slightly.   L5-S1: Shallow disc protrusion slightly more prominent towards the left. Stenosis of the subarticular lateral recess on the left that could possibly affect the left S1 nerve. The subarticular lateral recess narrowing may be slightly more pronounced than was seen in August 17, 2015. The disc material at this level has also desiccated slightly.     Electronically Signed   By: Paulina Fusi M.D.   On: 06/07/2021 08:06  PMFS History: Patient Active Problem List   Diagnosis Date Noted   Pain in right hand 07/03/2023   Rib pain on left side 01/03/2023   Closed avulsion fracture of distal fibula, left, initial encounter 09/28/2022   Diarrhea of presumed infectious origin 09/06/2022   Type 2 diabetes mellitus with obesity (HCC) 09/06/2022   Snoring 01/18/2022   Prediabetes 04/23/2018   History of  gastroesophageal reflux (GERD) 08/03/2017   History of vitamin D deficiency 08/03/2017   History of Helicobacter pylori infection 08/03/2017   History of urinary tract infection 08/03/2017   Asymptomatic microscopic hematuria 08/03/2017   Chronic abdominal pain 08/03/2017   Left lower quadrant pain 06/05/2016   Hemorrhoid 04/06/2016   Body mass index (BMI) 40.0-44.9, adult (HCC) 10/28/2015   Neck pain 10/28/2015   Shoulder pain, bilateral 10/28/2015   Vertigo 07/29/2015   Tachycardia 07/08/2015   Vitamin D deficiency 04/08/2015   Lumbar back pain with radiculopathy affecting left lower extremity 04/07/2015   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   Chronic pain of multiple joints 01/29/2015   Irregular menses 10/01/2014  Allergic rhinitis 10/01/2014   Chalazion of left upper eyelid 10/01/2014   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   Past Medical History:  Diagnosis Date   Hypertension    Irritable bowel syndrome (IBS)    Prediabetes     Family History  Problem Relation Age of Onset   Hypertension Father    High blood pressure Maternal Grandmother     Past Surgical History:  Procedure Laterality Date   CIRCUMCISION  as a young girl    Type III femake circumcision    DILATION AND CURETTAGE OF UTERUS  2006   HEMORRHOID SURGERY  2015   HEMORRHOID SURGERY     in [place]   Social History   Occupational History   Not on file  Tobacco Use   Smoking status: Never   Smokeless tobacco: Never  Vaping Use   Vaping status: Never Used  Substance and Sexual Activity   Alcohol use: No   Drug use: No   Sexual activity: Yes    Birth control/protection: None

## 2023-08-02 ENCOUNTER — Telehealth: Payer: Self-pay | Admitting: Nurse Practitioner

## 2023-08-02 NOTE — Telephone Encounter (Signed)
Copied from CRM [id number]. Topic: General - Other >> Aug 02, 2023  1:54 PM Phill Myron wrote: Pt Candice [name] is inquiring about her document she dropped off a week ago. This was a document for her new job.

## 2023-08-07 ENCOUNTER — Ambulatory Visit: Payer: 59 | Admitting: Physician Assistant

## 2023-08-13 ENCOUNTER — Ambulatory Visit: Payer: 59 | Admitting: Plastic Surgery

## 2023-08-13 ENCOUNTER — Encounter: Payer: Self-pay | Admitting: Plastic Surgery

## 2023-08-13 DIAGNOSIS — M542 Cervicalgia: Secondary | ICD-10-CM

## 2023-08-13 DIAGNOSIS — M549 Dorsalgia, unspecified: Secondary | ICD-10-CM | POA: Diagnosis not present

## 2023-08-13 DIAGNOSIS — Z6841 Body Mass Index (BMI) 40.0 and over, adult: Secondary | ICD-10-CM

## 2023-08-13 DIAGNOSIS — N62 Hypertrophy of breast: Secondary | ICD-10-CM | POA: Diagnosis not present

## 2023-09-13 NOTE — Progress Notes (Signed)
Referring Provider Claiborne Rigg, NP [address] [place] [address] [place],  [place] [address]   CC:  Chief Complaint  Patient presents with   Consult           Candice [name] is an 47 y.o. female.  HPI: Candice [name] is referred for evaluation of upper back and neck pain secondary to large breast size.  An interpreter was used for evaluation. Patient states that she has had ongoing problems with upper back and neck pain that she feels is due to the large size of her breast.  No Known Allergies  Outpatient Encounter Medications as of 08/13/2023  Medication Sig   amLODipine (NORVASC) 2.5 MG tablet TAKE 1 TABLET(2.5 MG) BY MOUTH DAILY   atorvastatin (LIPITOR) 40 MG tablet Take 1 tablet (40 mg total) by mouth daily.   DULoxetine (CYMBALTA) 60 MG capsule Take 1 capsule (60 mg total) by mouth daily.   gabapentin (NEURONTIN) 300 MG capsule Take 1 capsule (300 mg total) by mouth 3 (three) times daily.   metFORMIN (GLUCOPHAGE) 500 MG tablet Take 2 tablets (1,000 mg total) by mouth 2 (two) times daily with a meal.   Blood Glucose Monitoring Suppl (ONETOUCH VERIO REFLECT) w/Device KIT use daily as directed (Patient not taking: Reported on 08/13/2023)   clotrimazole (LOTRIMIN) 1 % cream Apply 1 Application topically 2 (two) times daily. (Patient not taking: Reported on 08/13/2023)   diclofenac Sodium (VOLTAREN) 1 % GEL Apply 4 g topically 4 (four) times daily. (Patient not taking: Reported on 08/13/2023)   fluticasone (FLONASE) 50 MCG/ACT nasal spray Place 2 sprays into both nostrils daily. (Patient not taking: Reported on 08/13/2023)   glucose blood (ONETOUCH VERIO) test strip Use to check blood sugar 3-4 times daily. E11.69 (Patient not taking: Reported on 08/13/2023)   HYDROcodone-acetaminophen (NORCO/VICODIN) 5-325 MG tablet Take 1 tablet by mouth every 8 (eight) hours as needed for moderate pain (pain score 4-6). (Patient not taking: Reported on 08/13/2023)   hydrocortisone-pramoxine  [facility]) 2.5-1 % rectal cream Insert 1 application rectally 3 (three) times daily. (Patient not taking: Reported on 08/13/2023)   ibuprofen (ADVIL) 600 MG tablet Take 1 tablet (600 mg total) by mouth every 6 (six) hours as needed. (Patient not taking: Reported on 08/13/2023)   lidocaine 4 % Place 1 patch onto the skin daily. (Patient not taking: Reported on 08/13/2023)   loratadine (CLARITIN) 10 MG tablet Take 1 tablet (10 mg total) by mouth daily. (Patient not taking: Reported on 08/13/2023)   meloxicam (MOBIC) 7.5 MG tablet Take 1 tablet (7.5 mg total) by mouth daily. (Patient not taking: Reported on 08/13/2023)   miconazole (MICONAZOLE 7) 2 % vaginal cream Place 1 Applicatorful vaginally at bedtime. (Patient not taking: Reported on 08/13/2023)   TRUEplus Lancets 28G MISC Use as instructed. Check blood glucose level by fingerstick three times per day. (Patient not taking: Reported on 08/13/2023)   No facility-administered encounter medications on file as of 08/13/2023.     Past Medical History:  Diagnosis Date   Hypertension    Irritable bowel syndrome (IBS)    Prediabetes     Past Surgical History:  Procedure Laterality Date   CIRCUMCISION  as a young girl    Type III femake circumcision    DILATION AND CURETTAGE OF UTERUS  2006   HEMORRHOID SURGERY  2015   HEMORRHOID SURGERY     in [place]    Family History  Problem Relation Age of Onset   Hypertension Father  High blood pressure Maternal Grandmother     Social History   Social History Narrative   Not on file     Review of Systems General: Denies fevers, chills, weight loss CV: Denies chest pain, shortness of breath, palpitations Breast: Large breasts which the patient feels are contributing to the upper back and neck pain  Physical Exam    08/13/2023   11:29 AM 06/25/2023   10:38 AM 04/12/2023   11:30 AM  Vitals with BMI  Height 5\' 0"     Weight 243 lbs 6 oz 246 lbs 6 oz   BMI 47.54    Systolic 138 162  137  Diastolic 95 98 88  Pulse 103 110     General:  No acute distress,  Alert and oriented, Non-Toxic, Normal speech and affect Patient not examined on this visit. Mammogram: No mammogram is available.  Patient will need a mammogram prior to any breast surgery. Assessment/Plan Macromastia: Patient is currently a BMI of 47.  We discussed the fact that at this weight she is at a much higher risk for surgical complications and that I generally will not perform surgery until BMI is 40 or less.  Even at this level complication rates are much higher but they are more manageable. Patient will continue to work on weight loss both on her own and with her primary care provider.  She may return at any time she likes for evaluation and we will plan on scheduling surgery as soon as her BMI is less than 40.  She has been offered and would like a referral to the healthy weight and wellness program to assist with her weight management.  Santiago Glad 09/13/2023, 5:20 PM

## 2023-10-02 ENCOUNTER — Ambulatory Visit: Payer: Self-pay | Admitting: Pharmacist

## 2023-10-02 ENCOUNTER — Encounter: Payer: Self-pay | Admitting: Nurse Practitioner

## 2023-10-02 ENCOUNTER — Ambulatory Visit: Payer: 59 | Attending: Nurse Practitioner | Admitting: Nurse Practitioner

## 2023-10-02 VITALS — BP 125/92 | HR 114 | Wt 245.0 lb

## 2023-10-02 DIAGNOSIS — Z7985 Long-term (current) use of injectable non-insulin antidiabetic drugs: Secondary | ICD-10-CM | POA: Diagnosis not present

## 2023-10-02 DIAGNOSIS — I10 Essential (primary) hypertension: Secondary | ICD-10-CM

## 2023-10-02 DIAGNOSIS — E66813 Obesity, class 3: Secondary | ICD-10-CM

## 2023-10-02 DIAGNOSIS — E1169 Type 2 diabetes mellitus with other specified complication: Secondary | ICD-10-CM | POA: Diagnosis not present

## 2023-10-02 DIAGNOSIS — Z6841 Body Mass Index (BMI) 40.0 and over, adult: Secondary | ICD-10-CM | POA: Diagnosis not present

## 2023-10-02 DIAGNOSIS — E669 Obesity, unspecified: Secondary | ICD-10-CM

## 2023-10-02 DIAGNOSIS — M542 Cervicalgia: Secondary | ICD-10-CM | POA: Diagnosis not present

## 2023-10-02 DIAGNOSIS — D649 Anemia, unspecified: Secondary | ICD-10-CM

## 2023-10-02 DIAGNOSIS — R748 Abnormal levels of other serum enzymes: Secondary | ICD-10-CM | POA: Diagnosis not present

## 2023-10-02 DIAGNOSIS — G8929 Other chronic pain: Secondary | ICD-10-CM

## 2023-10-02 DIAGNOSIS — M545 Low back pain, unspecified: Secondary | ICD-10-CM | POA: Diagnosis not present

## 2023-10-02 LAB — POCT GLYCOSYLATED HEMOGLOBIN (HGB A1C): HbA1c, POC (controlled diabetic range): 10.4 % — AB (ref 0.0–7.0)

## 2023-10-02 MED ORDER — DEXCOM G7 SENSOR MISC: 3 refills | Status: DC

## 2023-10-02 MED ORDER — BASAGLAR KWIKPEN 100 UNIT/ML ~~LOC~~ SOPN: 10.0000 [IU] | PEN_INJECTOR | Freq: Every day | SUBCUTANEOUS | 6 refills | Status: DC

## 2023-10-02 MED ORDER — TRULICITY 0.75 MG/0.5ML ~~LOC~~ SOAJ: 0.7500 mg | SUBCUTANEOUS | 1 refills | Status: DC

## 2023-10-02 MED ORDER — BD PEN NEEDLE MINI U/F 31G X 5 MM MISC: 1 refills | Status: DC

## 2023-10-02 MED ORDER — AMLODIPINE BESYLATE 5 MG PO TABS: 5.0000 mg | ORAL_TABLET | Freq: Every day | ORAL | 1 refills | Status: DC

## 2023-10-02 MED ORDER — DEXCOM G7 RECEIVER DEVI: 0 refills | Status: DC
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
## 2023-10-03 LAB — CMP14+EGFR
ALT: 26 [IU]/L (ref 0–32)
AST: 17 [IU]/L (ref 0–40)
Albumin: 3.8 g/dL — ABNORMAL LOW (ref 3.9–4.9)
Alkaline Phosphatase: 248 [IU]/L — ABNORMAL HIGH (ref 44–121)
BUN/Creatinine Ratio: 21 (ref 9–23)
BUN: 12 mg/dL (ref 6–24)
Bilirubin Total: <0.2 mg/dL (ref 0.0–1.2)
CO2: 27 mmol/L (ref 20–29)
Calcium: 9.7 mg/dL (ref 8.7–10.2)
Chloride: 95 mmol/L — ABNORMAL LOW (ref 96–106)
Creatinine, Ser: 0.57 mg/dL (ref 0.57–1.00)
Globulin, Total: 3.1 g/dL (ref 1.5–4.5)
Glucose: 321 mg/dL — ABNORMAL HIGH (ref 70–99)
Potassium: 4.4 mmol/L (ref 3.5–5.2)
Sodium: 138 mmol/L (ref 134–144)
Total Protein: 6.9 g/dL (ref 6.0–8.5)
eGFR: 114 mL/min/{1.73_m2} (ref >59)

## 2023-10-03 LAB — CBC WITH DIFFERENTIAL/PLATELET
Basophils Absolute: 0.1 10*3/uL (ref 0.0–0.2)
Basos: 1 %
EOS (ABSOLUTE): 0.2 10*3/uL (ref 0.0–0.4)
Eos: 2 %
Hematocrit: 44 % (ref 34.0–46.6)
Hemoglobin: 13.4 g/dL (ref 11.1–15.9)
Immature Grans (Abs): 0.2 10*3/uL — ABNORMAL HIGH (ref 0.0–0.1)
Immature Granulocytes: 2 %
Lymphocytes Absolute: 2.2 10*3/uL (ref 0.7–3.1)
Lymphs: 22 %
MCH: 25.8 pg — ABNORMAL LOW (ref 26.6–33.0)
MCHC: 30.5 g/dL — ABNORMAL LOW (ref 31.5–35.7)
MCV: 85 fL (ref 79–97)
Monocytes Absolute: 0.4 10*3/uL (ref 0.1–0.9)
Monocytes: 4 %
Neutrophils Absolute: 7.1 10*3/uL — ABNORMAL HIGH (ref 1.4–7.0)
Neutrophils: 69 %
Platelets: 347 10*3/uL (ref 150–450)
RBC: 5.19 x10E6/uL (ref 3.77–5.28)
RDW: 13.5 % (ref 11.7–15.4)
WBC: 10.2 10*3/uL (ref 3.4–10.8)

## 2023-10-04 ENCOUNTER — Encounter: Payer: Self-pay | Admitting: Nurse Practitioner

## 2023-10-06 ENCOUNTER — Other Ambulatory Visit: Payer: Self-pay | Admitting: Physical Medicine & Rehabilitation

## 2023-10-12 ENCOUNTER — Ambulatory Visit
Admission: RE | Admit: 2023-10-12 | Discharge: 2023-10-12 | Disposition: A | Discharge status: Home / Self Care | Payer: 59 | Source: Ambulatory Visit | Attending: Nurse Practitioner | Admitting: Nurse Practitioner

## 2023-10-12 DIAGNOSIS — R748 Abnormal levels of other serum enzymes: Secondary | ICD-10-CM

## 2023-10-12 DIAGNOSIS — R7989 Other specified abnormal findings of blood chemistry: Secondary | ICD-10-CM | POA: Diagnosis not present

## 2023-10-16 ENCOUNTER — Encounter: Payer: 59 | Admitting: Physician Assistant

## 2023-10-18 ENCOUNTER — Encounter: Payer: Self-pay | Admitting: Nurse Practitioner

## 2023-10-19 ENCOUNTER — Ambulatory Visit: Payer: 59 | Attending: Nurse Practitioner

## 2023-10-19 ENCOUNTER — Other Ambulatory Visit: Payer: Self-pay

## 2023-10-19 DIAGNOSIS — R7989 Other specified abnormal findings of blood chemistry: Secondary | ICD-10-CM

## 2023-10-20 ENCOUNTER — Other Ambulatory Visit: Payer: Self-pay | Admitting: Nurse Practitioner

## 2023-10-20 DIAGNOSIS — R7989 Other specified abnormal findings of blood chemistry: Secondary | ICD-10-CM

## 2023-10-20 LAB — HEPATIC FUNCTION PANEL
ALT: 28 [IU]/L (ref 0–32)
AST: 22 [IU]/L (ref 0–40)
Albumin: 3.7 g/dL — ABNORMAL LOW (ref 3.9–4.9)
Alkaline Phosphatase: 217 [IU]/L — ABNORMAL HIGH (ref 44–121)
Bilirubin Total: <0.2 mg/dL (ref 0.0–1.2)
Bilirubin, Direct: <0.08 mg/dL (ref 0.00–0.40)
Total Protein: 6.8 g/dL (ref 6.0–8.5)

## 2023-11-14 ENCOUNTER — Ambulatory Visit: Payer: 59 | Attending: Family Medicine

## 2023-11-14 DIAGNOSIS — R7989 Other specified abnormal findings of blood chemistry: Secondary | ICD-10-CM | POA: Diagnosis not present

## 2023-11-15 LAB — HEPATIC FUNCTION PANEL
ALT: 23 IU/L (ref 0–32)
AST: 19 IU/L (ref 0–40)
Albumin: 3.8 g/dL — ABNORMAL LOW (ref 3.9–4.9)
Alkaline Phosphatase: 271 IU/L — ABNORMAL HIGH (ref 44–121)
Bilirubin Total: 0.2 mg/dL (ref 0.0–1.2)
Bilirubin, Direct: 0.09 mg/dL (ref 0.00–0.40)
Total Protein: 7.4 g/dL (ref 6.0–8.5)

## 2023-11-18 ENCOUNTER — Other Ambulatory Visit: Payer: Self-pay | Admitting: Nurse Practitioner

## 2023-11-18 DIAGNOSIS — R7989 Other specified abnormal findings of blood chemistry: Secondary | ICD-10-CM

## 2023-11-18 DIAGNOSIS — K760 Fatty (change of) liver, not elsewhere classified: Secondary | ICD-10-CM

## 2023-11-18 DIAGNOSIS — Z1211 Encounter for screening for malignant neoplasm of colon: Secondary | ICD-10-CM

## 2023-11-21 ENCOUNTER — Encounter (INDEPENDENT_AMBULATORY_CARE_PROVIDER_SITE_OTHER): Payer: Self-pay

## 2023-11-22 ENCOUNTER — Other Ambulatory Visit (HOSPITAL_BASED_OUTPATIENT_CLINIC_OR_DEPARTMENT_OTHER): Admitting: Pharmacist

## 2023-11-22 ENCOUNTER — Telehealth (HOSPITAL_BASED_OUTPATIENT_CLINIC_OR_DEPARTMENT_OTHER): Admitting: Pharmacist

## 2023-11-22 DIAGNOSIS — Z7984 Long term (current) use of oral hypoglycemic drugs: Secondary | ICD-10-CM

## 2023-11-22 DIAGNOSIS — E669 Obesity, unspecified: Secondary | ICD-10-CM

## 2023-11-22 DIAGNOSIS — Z7985 Long-term (current) use of injectable non-insulin antidiabetic drugs: Secondary | ICD-10-CM

## 2023-11-22 DIAGNOSIS — Z794 Long term (current) use of insulin: Secondary | ICD-10-CM

## 2023-11-22 DIAGNOSIS — E1169 Type 2 diabetes mellitus with other specified complication: Secondary | ICD-10-CM

## 2023-11-22 NOTE — Telephone Encounter (Signed)
 Pharmacy TNM Diabetes Measure Review  S:  Patient was identified in a report as being at risk for failing the True [place] Metric of A1c control (<8%) in [place] and African American patients. Last A1c was 10.4%. Last PCP visit was 10/02/2023.  Call placed to patient to discuss diabetes control and medication management. I called with our translator service. Unfortunately, pt was unavailable and I was unable to leave a VM. Of note, A1c at last PCP visit shows worsening DM control. Pt was restarted on Trulicity and started on Lantus.   Current diabetes medications include: Lantus 10u daily, Trulicity 0.75 mg weekly, metformin 1000 mg BID (takes two 500 mg tablets BID)  Dispense history:  -Has been filling metformin consistently. Last filled 30-day supply pm 10/30/2023. -Has not filled Basaglar since 10/02/2023. Filled a 30-day supply at that time. -Has not filled Trulicity since 10/04/2023. Filled a 28-day supply at that time.   Insurance coverage: [employer]  O:  Lab Results  Component Value Date   HGBA1C 10.4 (A) 10/02/2023   There were no vitals filed for this visit.  Lipid Panel     Component Value Date/Time   CHOL 218 (H) 06/25/2023 1123   TRIG 194 (H) 06/25/2023 1123   HDL 59 06/25/2023 1123   CHOLHDL 3.7 06/25/2023 1123   LDLCALC 125 (H) 06/25/2023 1123    Clinical Atherosclerotic Cardiovascular Disease (ASCVD): No  The 10-year ASCVD risk score (Arnett DK, et al., 2019) is: 4.6%   Values used to calculate the score:     Age: 48 years     Sex: Female     Is Non-Hispanic African American: Yes     Diabetic: Yes     Tobacco smoker: No     Systolic Blood Pressure: 125 mmHg     Is BP treated: Yes     HDL Cholesterol: 59 mg/dL     Total Cholesterol: 218 mg/dL   Patient is participating in a Managed Medicaid Plan: No   A/P: Diabetes longstanding currently uncontrolled. Adherence looks to be a continued issues. Per her dispense report, she is about 2-3 weeks behind on Trulicity and  insulin refills. I contacted her pharmacy to have them fill these.  -Contacted pt's pharmacy to process refills of Basaglar and Trulicity -Next A1c anticipated 12/2023.   Follow-up:  Pharmacist prn. PCP clinic visit 12/31/23  Butch Penny, PharmD, BCACP, CPP Clinical Pharmacist [facility] [phone]

## 2023-11-22 NOTE — Progress Notes (Signed)
 Opened in error

## 2023-11-23 ENCOUNTER — Telehealth: Payer: Self-pay | Admitting: *Deleted

## 2023-11-23 NOTE — Telephone Encounter (Signed)
 Copied from CRM [id number]. Topic: Clinical - Lab/Test Results >> Nov 22, 2023  5:45 PM Victorino Dike T wrote: Reason for CRM: Patient calling for results of labs- please call with Arabic translator [phone]

## 2023-11-23 NOTE — Telephone Encounter (Signed)
 Attempt to call patient with Arabic Interpreter- Candice [name], ID# [phone]  Pt name and DOB verified. Patient aware of results and result note per PCP, Bertram Denver NP  Patient requested phone number to GI.  Placed in [facility] [address] [place], [place] [address] PH# [phone]

## 2023-12-01 ENCOUNTER — Other Ambulatory Visit: Payer: Self-pay | Admitting: Physical Medicine & Rehabilitation

## 2023-12-05 ENCOUNTER — Emergency Department (HOSPITAL_BASED_OUTPATIENT_CLINIC_OR_DEPARTMENT_OTHER)
Admission: EM | Admit: 2023-12-05 | Discharge: 2023-12-05 | Disposition: A | Discharge status: Home / Self Care | Attending: Student | Admitting: Student

## 2023-12-05 ENCOUNTER — Emergency Department (HOSPITAL_BASED_OUTPATIENT_CLINIC_OR_DEPARTMENT_OTHER)

## 2023-12-05 ENCOUNTER — Encounter (HOSPITAL_BASED_OUTPATIENT_CLINIC_OR_DEPARTMENT_OTHER): Payer: Self-pay

## 2023-12-05 ENCOUNTER — Other Ambulatory Visit: Payer: Self-pay

## 2023-12-05 DIAGNOSIS — Z7984 Long term (current) use of oral hypoglycemic drugs: Secondary | ICD-10-CM | POA: Diagnosis not present

## 2023-12-05 DIAGNOSIS — N281 Cyst of kidney, acquired: Secondary | ICD-10-CM | POA: Diagnosis not present

## 2023-12-05 DIAGNOSIS — E1169 Type 2 diabetes mellitus with other specified complication: Secondary | ICD-10-CM

## 2023-12-05 DIAGNOSIS — Z79899 Other long term (current) drug therapy: Secondary | ICD-10-CM | POA: Diagnosis not present

## 2023-12-05 DIAGNOSIS — I10 Essential (primary) hypertension: Secondary | ICD-10-CM | POA: Insufficient documentation

## 2023-12-05 DIAGNOSIS — K760 Fatty (change of) liver, not elsewhere classified: Secondary | ICD-10-CM | POA: Diagnosis not present

## 2023-12-05 DIAGNOSIS — Z794 Long term (current) use of insulin: Secondary | ICD-10-CM | POA: Insufficient documentation

## 2023-12-05 DIAGNOSIS — R739 Hyperglycemia, unspecified: Secondary | ICD-10-CM | POA: Diagnosis present

## 2023-12-05 DIAGNOSIS — R935 Abnormal findings on diagnostic imaging of other abdominal regions, including retroperitoneum: Secondary | ICD-10-CM | POA: Diagnosis not present

## 2023-12-05 DIAGNOSIS — R160 Hepatomegaly, not elsewhere classified: Secondary | ICD-10-CM | POA: Diagnosis not present

## 2023-12-05 DIAGNOSIS — E1165 Type 2 diabetes mellitus with hyperglycemia: Secondary | ICD-10-CM

## 2023-12-05 DIAGNOSIS — R300 Dysuria: Secondary | ICD-10-CM | POA: Diagnosis not present

## 2023-12-05 LAB — COMPREHENSIVE METABOLIC PANEL
ALT: 27 U/L (ref 0–44)
AST: 22 U/L (ref 15–41)
Albumin: 4.1 g/dL (ref 3.5–5.0)
Alkaline Phosphatase: 262 U/L — ABNORMAL HIGH (ref 38–126)
Anion gap: 13 (ref 5–15)
BUN: 12 mg/dL (ref 6–20)
CO2: 25 mmol/L (ref 22–32)
Calcium: 9.9 mg/dL (ref 8.9–10.3)
Chloride: 91 mmol/L — ABNORMAL LOW (ref 98–111)
Creatinine, Ser: 0.65 mg/dL (ref 0.44–1.00)
GFR, Estimated: >60 mL/min (ref >60)
Glucose, Bld: 659 mg/dL (ref 70–99)
Potassium: 4.2 mmol/L (ref 3.5–5.1)
Sodium: 129 mmol/L — ABNORMAL LOW (ref 135–145)
Total Bilirubin: 0.3 mg/dL (ref 0.0–1.2)
Total Protein: 7.7 g/dL (ref 6.5–8.1)

## 2023-12-05 LAB — CBC
HCT: 44 % (ref 36.0–46.0)
Hemoglobin: 14.4 g/dL (ref 12.0–15.0)
MCH: 26.2 pg (ref 26.0–34.0)
MCHC: 32.7 g/dL (ref 30.0–36.0)
MCV: 80.1 fL (ref 80.0–100.0)
Platelets: 296 10*3/uL (ref 150–400)
RBC: 5.49 MIL/uL — ABNORMAL HIGH (ref 3.87–5.11)
RDW: 13.2 % (ref 11.5–15.5)
WBC: 6.3 10*3/uL (ref 4.0–10.5)
nRBC: 0 % (ref 0.0–0.2)

## 2023-12-05 LAB — CBG MONITORING, ED
Glucose-Capillary: >600 mg/dL (ref 70–99)
Glucose-Capillary: 468 mg/dL — ABNORMAL HIGH (ref 70–99)
Glucose-Capillary: 437 mg/dL — ABNORMAL HIGH (ref 70–99)
Glucose-Capillary: 333 mg/dL — ABNORMAL HIGH (ref 70–99)
Glucose-Capillary: 280 mg/dL — ABNORMAL HIGH (ref 70–99)

## 2023-12-05 LAB — URINALYSIS, W/ REFLEX TO CULTURE (INFECTION SUSPECTED)
Bilirubin Urine: NEGATIVE
Glucose, UA: >1000 mg/dL — AB
Ketones, ur: 15 mg/dL — AB
Leukocytes,Ua: NEGATIVE
Nitrite: NEGATIVE
Protein, ur: NEGATIVE mg/dL
Specific Gravity, Urine: 1.036 — ABNORMAL HIGH (ref 1.005–1.030)
pH: 5.5 (ref 5.0–8.0)

## 2023-12-05 LAB — OSMOLALITY: Osmolality: 314 mosm/kg — ABNORMAL HIGH (ref 275–295)

## 2023-12-05 LAB — I-STAT VENOUS BLOOD GAS, ED
Acid-Base Excess: 1 mmol/L (ref 0.0–2.0)
Bicarbonate: 25.7 mmol/L (ref 20.0–28.0)
Calcium, Ion: 1.25 mmol/L (ref 1.15–1.40)
HCT: 42 % (ref 36.0–46.0)
Hemoglobin: 14.3 g/dL (ref 12.0–15.0)
O2 Saturation: 90 %
Patient temperature: 98.2
Potassium: 3.7 mmol/L (ref 3.5–5.1)
Sodium: 133 mmol/L — ABNORMAL LOW (ref 135–145)
TCO2: 27 mmol/L (ref 22–32)
pCO2, Ven: 40.2 mmHg — ABNORMAL LOW (ref 44–60)
pH, Ven: 7.412 (ref 7.25–7.43)
pO2, Ven: 56 mmHg — ABNORMAL HIGH (ref 32–45)

## 2023-12-05 LAB — BETA-HYDROXYBUTYRIC ACID: Beta-Hydroxybutyric Acid: 1.79 mmol/L — ABNORMAL HIGH (ref 0.05–0.27)

## 2023-12-05 LAB — PREGNANCY, URINE: Preg Test, Ur: NEGATIVE

## 2023-12-05 LAB — LIPASE, BLOOD: Lipase: 46 U/L (ref 11–51)

## 2023-12-05 MED ORDER — TRUEPLUS LANCETS 28G MISC: 3 refills | Status: AC

## 2023-12-05 MED ORDER — DEXTROSE IN LACTATED RINGERS 5 % IV SOLN: INTRAVENOUS | Status: DC

## 2023-12-05 MED ORDER — INSULIN ASPART 100 UNIT/ML IJ SOLN: 20.0000 [IU] | Freq: Once | INTRAMUSCULAR | Status: DC

## 2023-12-05 MED ORDER — INSULIN ASPART 100 UNIT/ML IJ SOLN
15.0000 [IU] | Freq: Once | INTRAMUSCULAR | Status: AC
  Administered 2023-12-05: 15 [IU] via SUBCUTANEOUS

## 2023-12-05 MED ORDER — BASAGLAR KWIKPEN 100 UNIT/ML ~~LOC~~ SOPN: 10.0000 [IU] | PEN_INJECTOR | Freq: Every day | SUBCUTANEOUS | 0 refills | Status: DC

## 2023-12-05 MED ORDER — IOHEXOL 300 MG/ML  SOLN
100.0000 mL | Freq: Once | INTRAMUSCULAR | Status: AC | PRN
  Administered 2023-12-05: 100 mL via INTRAVENOUS

## 2023-12-05 MED ORDER — LACTATED RINGERS IV BOLUS
2000.0000 mL | Freq: Once | INTRAVENOUS | Status: AC
  Administered 2023-12-05: 2000 mL via INTRAVENOUS

## 2023-12-05 MED ORDER — DEXTROSE 50 % IV SOLN: 0.0000 mL | INTRAVENOUS | Status: DC | PRN

## 2023-12-05 MED ORDER — LACTATED RINGERS IV SOLN: INTRAVENOUS | Status: DC

## 2023-12-05 NOTE — ED Notes (Signed)
 Patient transported to CT

## 2023-12-05 NOTE — ED Provider Notes (Signed)
 [place] EMERGENCY DEPARTMENT AT [facility] Provider Note   CSN: [id number] Arrival date & time: 12/05/23  1200     History  Chief Complaint  Patient presents with   Hyperglycemia    Jeniah Kishi is a 48 y.o. female.  With a history of IBS, hypertension, IDDM 2 presenting to the ED for evaluation of hyperglycemia, abdominal pain, nausea, urinary frequency, dry mouth.  Symptoms began approximate 1 month ago.  She was on insulin for 2 to 3 months but states that it did not decrease her blood sugar so she stopped taking insulin.  This occurred just prior to symptom onset.  She states a few days ago she developed a left lower quadrant abdominal pain.  She had an episode of diarrhea this morning.  She denies fevers.  No cough or shortness of breath.  She has been taking her metformin as prescribed.  She reports urinary frequency but denies dysuria or urgency.  History is provided by the patient with use of an Arabic interpreter.   Hyperglycemia Associated symptoms: abdominal pain, nausea and vomiting        Home Medications Prior to Admission medications   Medication Sig Start Date End Date Taking? Authorizing Provider  amLODipine (NORVASC) 5 MG tablet Take 1 tablet (5 mg total) by mouth daily. 10/02/23   Claiborne Rigg, NP  atorvastatin (LIPITOR) 40 MG tablet Take 1 tablet (40 mg total) by mouth daily. 07/09/23   Claiborne Rigg, NP  Blood Glucose Monitoring Suppl [facility] VERIO REFLECT) w/Device KIT use daily as directed Patient not taking: Reported on 08/13/2023 11/24/21   Anders Simmonds, PA-C  Continuous Glucose Receiver (DEXCOM G7 RECEIVER) DEVI Use as instructed. Check blood glucose level by fingerstick three times per day. 10/02/23   Claiborne Rigg, NP  Continuous Glucose Sensor (DEXCOM G7 SENSOR) MISC Use as instructed. Check blood glucose level by fingerstick three times per day.  E11.65 10/02/23   Claiborne Rigg, NP  diclofenac Sodium (VOLTAREN) 1 % GEL  Apply 4 g topically 4 (four) times daily. 08/25/22   Fanny Dance, MD  Dulaglutide (TRULICITY) 0.75 MG/0.5ML SOAJ Inject 0.75 mg into the skin once a week. 10/02/23   Claiborne Rigg, NP  DULoxetine (CYMBALTA) 60 MG capsule TAKE 1 CAPSULE BY MOUTH EVERY DAY 12/03/23   Fanny Dance, MD  fluticasone [facility]) 50 MCG/ACT nasal spray Place 2 sprays into both nostrils daily. 09/06/22   Storm Frisk, MD  gabapentin (NEURONTIN) 300 MG capsule Take 1 capsule (300 mg total) by mouth 3 (three) times daily. 04/12/23   Fanny Dance, MD  glucose blood (ONETOUCH VERIO) test strip Use to check blood sugar 3-4 times daily. E11.69 Patient not taking: Reported on 08/13/2023 08/03/22   Hoy Register, MD  ibuprofen (ADVIL) 600 MG tablet Take 1 tablet (600 mg total) by mouth every 6 (six) hours as needed. Patient not taking: Reported on 08/13/2023 10/20/22   Carlisle Beers, FNP  Insulin Glargine [facility]) 100 UNIT/ML Inject 10 Units into the skin at bedtime. 12/05/23   Aayden Cefalu, Edsel Petrin, PA-C  Insulin Pen Needle (B-D UF III MINI PEN NEEDLES) 31G X 5 MM MISC Use as instructed. Inject into the skin once nightly. 10/02/23   Claiborne Rigg, NP  metFORMIN (GLUCOPHAGE) 500 MG tablet Take 2 tablets (1,000 mg total) by mouth 2 (two) times daily with a meal. 06/25/23   Claiborne Rigg, NP  TRUEplus Lancets 28G MISC Use as instructed. Check blood glucose  level by fingerstick three times per day. 12/05/23   Sanika Brosious, Edsel Petrin, PA-C      Allergies    Patient has no known allergies.    Review of Systems   Review of Systems  Gastrointestinal:  Positive for abdominal pain, diarrhea, nausea and vomiting.  Genitourinary:  Positive for frequency.  All other systems reviewed and are negative.   Physical Exam Updated Vital Signs BP 119/78 (BP Location: Right Arm)   Pulse 92   Temp 98.3 F (36.8 C) (Oral)   Resp 18   Ht 5\' 4"  (1.626 m)   Wt 100.7 kg   SpO2 95%   BMI 38.11 kg/m  Physical  Exam Vitals and nursing note reviewed.  Constitutional:      General: She is not in acute distress.    Appearance: Normal appearance. She is normal weight. She is not ill-appearing.     Comments: Resting comfortably in bed  HENT:     Head: Normocephalic and atraumatic.  Cardiovascular:     Rate and Rhythm: Normal rate and regular rhythm.  Pulmonary:     Effort: Pulmonary effort is normal. No respiratory distress.     Breath sounds: No wheezing, rhonchi or rales.  Abdominal:     General: Abdomen is flat.     Tenderness: There is abdominal tenderness. There is no right CVA tenderness or left CVA tenderness.     Comments: LLQ abdominal TTP  Musculoskeletal:        General: Normal range of motion.     Cervical back: Neck supple.  Skin:    General: Skin is warm and dry.  Neurological:     Mental Status: She is alert and oriented to person, place, and time.  Psychiatric:        Mood and Affect: Mood normal.        Behavior: Behavior normal.     ED Results / Procedures / Treatments   Labs (all labs ordered are listed, but only abnormal results are displayed) Labs Reviewed  CBC - Abnormal; Notable for the following components:      Result Value   RBC 5.49 (*)    All other components within normal limits  COMPREHENSIVE METABOLIC PANEL - Abnormal; Notable for the following components:   Sodium 129 (*)    Chloride 91 (*)    Glucose, Bld 659 (*)    Alkaline Phosphatase 262 (*)    All other components within normal limits  BETA-HYDROXYBUTYRIC ACID - Abnormal; Notable for the following components:   Beta-Hydroxybutyric Acid 1.79 (*)    All other components within normal limits  OSMOLALITY - Abnormal; Notable for the following components:   Osmolality 314 (*)    All other components within normal limits  URINALYSIS, W/ REFLEX TO CULTURE (INFECTION SUSPECTED) - Abnormal; Notable for the following components:   Color, Urine COLORLESS (*)    Specific Gravity, Urine 1.036 (*)     Glucose, UA >1,000 (*)    Hgb urine dipstick TRACE (*)    Ketones, ur 15 (*)    Bacteria, UA RARE (*)    All other components within normal limits  CBG MONITORING, ED - Abnormal; Notable for the following components:   Glucose-Capillary >600 (*)    All other components within normal limits  I-STAT VENOUS BLOOD GAS, ED - Abnormal; Notable for the following components:   pCO2, Ven 40.2 (*)    pO2, Ven 56 (*)    Sodium 133 (*)    All other  components within normal limits  CBG MONITORING, ED - Abnormal; Notable for the following components:   Glucose-Capillary 468 (*)    All other components within normal limits  CBG MONITORING, ED - Abnormal; Notable for the following components:   Glucose-Capillary 437 (*)    All other components within normal limits  CBG MONITORING, ED - Abnormal; Notable for the following components:   Glucose-Capillary 333 (*)    All other components within normal limits  CBG MONITORING, ED - Abnormal; Notable for the following components:   Glucose-Capillary 280 (*)    All other components within normal limits  PREGNANCY, URINE  BLOOD GAS, VENOUS  LIPASE, BLOOD  CBG MONITORING, ED    EKG None  Radiology CT ABDOMEN PELVIS W CONTRAST Result Date: 12/05/2023 CLINICAL DATA:  Left lower quadrant abdominal pain. EXAM: CT ABDOMEN AND PELVIS WITH CONTRAST TECHNIQUE: Multidetector CT imaging of the abdomen and pelvis was performed using the standard protocol following bolus administration of intravenous contrast. RADIATION DOSE REDUCTION: This exam was performed according to the departmental dose-optimization program which includes automated exposure control, adjustment of the mA and/or kV according to patient size and/or use of iterative reconstruction technique. CONTRAST:  OMNIPAQUE IOHEXOL 300 MG/ML  SOLN COMPARISON:  06/03/2019 FINDINGS: Lower chest: Clear lung bases. Hepatobiliary: The liver is enlarged spanning 20.2 cm cranial caudal with advanced steatosis.  Geographic areas of focal fatty sparing. No discrete focal lesion. Unremarkable gallbladder. No calcified gallstone or pericholecystic inflammation. No biliary dilatation. Pancreas: No ductal dilatation or inflammation. Spleen: Normal in size without focal abnormality. Adrenals/Urinary Tract: 2.3 cm left adrenal nodule is stable from 2020, likely adenoma. No further imaging follow-up is needed. Normal right adrenal gland. No hydronephrosis. Left renal cyst as well as low-density lesions too small to characterize. Stable extrarenal pelvis configuration of the left kidney. Urinary bladder is near completely empty. Stomach/Bowel: The stomach is nondistended. There is no small bowel obstruction or inflammation. Normal appendix visualized. Small to moderate volume of stool in the colon. No colonic wall thickening or inflammation. Vascular/Lymphatic: No acute vascular findings. Normal caliber abdominal aorta. The portal vein is patent. No enlarged lymph nodes in the abdomen or pelvis. Reproductive: Uterus and bilateral adnexa are unremarkable. Other: No free air or ascites. No abdominopelvic collection diminutive fat containing umbilical hernia. No inguinal hernia. Musculoskeletal: Mild lower lumbar degenerative disc disease. There are no acute or suspicious osseous abnormalities. IMPRESSION: 1. No acute abnormality or explanation for left lower quadrant pain. 2. Hepatomegaly with advanced hepatic steatosis. Electronically Signed   By: Narda Rutherford M.D.   On: 12/05/2023 16:38    Procedures Procedures    Medications Ordered in ED Medications  lactated ringers infusion (has no administration in time range)  dextrose 5 % in lactated ringers infusion (has no administration in time range)  dextrose 50 % solution 0-50 mL (has no administration in time range)  lactated ringers bolus 2,000 mL (0 mLs Intravenous Stopped 12/05/23 1600)  insulin aspart (novoLOG) injection 15 Units (15 Units Subcutaneous Given 12/05/23  1425)  iohexol (OMNIPAQUE) 300 MG/ML solution 100 mL (100 mLs Intravenous Contrast Given 12/05/23 1502)    ED Course/ Medical Decision Making/ A&P                                 Medical Decision Making Amount and/or Complexity of Data Reviewed Labs: ordered. Radiology: ordered.  Risk Prescription drug management.   This patient presents  to the ED for concern of hyperglycemia, this involves an extensive number of treatment options, and is a complaint that carries with it a high risk of complications and morbidity.  The differential diagnosis includes hyperglycemia, DKA, HHS  My initial workup includes labs, imaging, fluids  Additional history obtained from: Nursing notes from this visit.  I ordered, reviewed and interpreted labs which include: CBC, CMP, lipase, urinalysis, VBG, beta hydroxybutyrate, osmolality, urinalysis, urine pregnancy.  No leukocytosis or anemia.  Pseudohyponatremia 129 with a corrected of 138.  Normal bicarb.  No anion gap.  Alk phos elevated to 262.  Bilirubin normal.  VBG without acidosis with a pH of 7.41, bicarb 25.7.  Beta hydroxybutyrate 1.79, osmolality 314.  Initial blood glucose 659.  Improved to 280.  Urine concentrated with specific gravity 1.036, only 15 ketones, no evidence of bacteria  I ordered imaging studies including CT abdomen pelvis I independently visualized and interpreted imaging which showed hepatic steatosis, no acute intra-abdominal abnormalities I agree with the radiologist interpretation  Afebrile, initially tachycardic which improved after treatment in the emergency department.  Otherwise hemodynamically stable.  48 year old female presenting to the ED for evaluation abdominal pain, nausea, urinary frequency.  Symptoms have been present for the past month.  Symptoms began shortly after discontinuing her insulin.  She states that her insulin was not working.  She went to urgent care but was referred to the emergency department for blood  glucose greater than 500.  On initial lab work, her blood glucose was 659.  Anion gap normal.  No acidosis on VBG.  Bicarb normal.  Beta hydroxybutyrate less than 3, serum osmolality less than 330.  She is mentating appropriately.  Low suspicion for DKA or HHS.  Suspect symptomatic hyperglycemia.  This was treated successfully in the emergency department with glucose decreasing to 280.  Overall, patient appears appropriate for discharge home.  She has an appointment with GI in 4 days.  She was encouraged to follow-up as soon as possible with her PCP to evaluate continued antihyperglycemic medications.  She states she would prefer to start pills.  She was informed that this is a decision between her and her PCP, however she has been prescribed insulin in the past so we will restart what she has been prescribed.  She has a blood glucose monitor at home.  She was encouraged to check her sugar frequently and track this.  She was given information regarding low glycemic index diet.  CT abdomen pelvis was reassuring.  Hepatic steatosis is known by the patient.  She was agreeable to discharge with close outpatient follow-up.  She was given return precautions.  Stable at discharge.  At this time there does not appear to be any evidence of an acute emergency medical condition and the patient appears stable for discharge with appropriate outpatient follow up. Diagnosis was discussed with patient who verbalizes understanding of care plan and is agreeable to discharge. I have discussed return precautions with patient who verbalizes understanding. Patient encouraged to follow-up with their PCP within 1 week. All questions answered.  Patient's case discussed with Dr. Posey Rea who agrees with plan to discharge with follow-up.   Note: Portions of this report may have been transcribed using voice recognition software. Every effort was made to ensure accuracy; however, inadvertent computerized transcription errors may still be  present.        Final Clinical Impression(s) / ED Diagnoses Final diagnoses:  Hyperglycemia  Hepatic steatosis    Rx / DC Orders ED Discharge  Orders          Ordered    Insulin Glargine [facility] KWIKPEN) 100 UNIT/ML  Daily at bedtime        12/05/23 1737    TRUEplus Lancets 28G MISC        12/05/23 1737              Michelle Piper, PA-C 12/05/23 1814    Glendora Score, MD 12/05/23 1845

## 2023-12-05 NOTE — Discharge Instructions (Addendum)
 You have been seen today for your complaint of high blood sugar. Your lab work initially showed very high blood sugar but was treated in the emergency department and is otherwise reassuring. Your imaging was reassuring, only showed a fatty liver. Your discharge medications include insulin.  I have resent your Basaglar.  You are to inject 10 units into your skin at bedtime. Home care instructions are as follows:  Eat a healthy diet.  Monitor your blood sugar very closely Follow up with: Your primary care provider as soon as possible Please seek immediate medical care if you develop any of the following symptoms: Any new or worsening symptoms At this time there does not appear to be the presence of an emergent medical condition, however there is always the potential for conditions to change. Please read and follow the below instructions.  Do not take your medicine if  develop an itchy rash, swelling in your mouth or lips, or difficulty breathing; call 911 and seek immediate emergency medical attention if this occurs.  You may review your lab tests and imaging results in their entirety on your MyChart account.  Please discuss all results of fully with your primary care provider and other specialist at your follow-up visit.  Note: Portions of this text may have been transcribed using voice recognition software. Every effort was made to ensure accuracy; however, inadvertent computerized transcription errors may still be present.

## 2023-12-05 NOTE — ED Triage Notes (Addendum)
 [place] Arabic interpretor used to assist with triage. Pt coming from UC d/t hyperglycemia , was told blood sugar > 500.  C/o nausea, urinary frequency.   Hx DM, Reports taking medications as prescribed.

## 2023-12-05 NOTE — ED Notes (Signed)
 She remains comfortable and is aware that we are awaiting the CT, which will occur shortly. I have just started the second liter of L.R.

## 2023-12-06 ENCOUNTER — Encounter (INDEPENDENT_AMBULATORY_CARE_PROVIDER_SITE_OTHER): Payer: Self-pay

## 2023-12-31 ENCOUNTER — Encounter: Payer: Self-pay | Admitting: Nurse Practitioner

## 2023-12-31 ENCOUNTER — Other Ambulatory Visit: Payer: Self-pay | Admitting: Nurse Practitioner

## 2023-12-31 ENCOUNTER — Ambulatory Visit: Payer: Self-pay | Attending: Nurse Practitioner | Admitting: Nurse Practitioner

## 2023-12-31 VITALS — BP 124/84 | HR 96 | Resp 18 | Ht 64.0 in | Wt 220.0 lb

## 2023-12-31 DIAGNOSIS — Z7985 Long-term (current) use of injectable non-insulin antidiabetic drugs: Secondary | ICD-10-CM

## 2023-12-31 DIAGNOSIS — R350 Frequency of micturition: Secondary | ICD-10-CM | POA: Diagnosis not present

## 2023-12-31 DIAGNOSIS — E1169 Type 2 diabetes mellitus with other specified complication: Secondary | ICD-10-CM

## 2023-12-31 DIAGNOSIS — Z794 Long term (current) use of insulin: Secondary | ICD-10-CM

## 2023-12-31 DIAGNOSIS — E119 Type 2 diabetes mellitus without complications: Secondary | ICD-10-CM | POA: Diagnosis not present

## 2023-12-31 LAB — POCT GLYCOSYLATED HEMOGLOBIN (HGB A1C): HbA1c POC (<> result, manual entry): >15 % (ref 4.0–5.6)

## 2023-12-31 MED ORDER — DEXCOM G7 SENSOR MISC: 3 refills | Status: DC

## 2023-12-31 MED ORDER — BASAGLAR KWIKPEN 100 UNIT/ML ~~LOC~~ SOPN: 10.0000 [IU] | PEN_INJECTOR | Freq: Every day | SUBCUTANEOUS | 0 refills | Status: DC

## 2023-12-31 MED ORDER — DEXCOM G7 RECEIVER DEVI: 0 refills | Status: AC

## 2023-12-31 MED ORDER — TRULICITY 0.75 MG/0.5ML ~~LOC~~ SOAJ: 0.7500 mg | SUBCUTANEOUS | 1 refills | Status: DC

## 2023-12-31 MED ORDER — ONETOUCH ULTRA CONTROL VI LIQD: 1.0000 | Freq: Every day | 99 refills | Status: AC | PRN

## 2023-12-31 NOTE — Progress Notes (Signed)
 Assessment & Plan:  Jatanna was seen today for medical management of chronic issues.  Diagnoses and all orders for this visit:  Type 2 diabetes mellitus with insulin  therapy Noncompliance is a major factor in diabetes being poorly controlled.  -     POCT glycosylated hemoglobin (Hb A1C) -     CMP14+EGFR -     Blood Glucose Calibration (ONETOUCH ULTRA CONTROL) LIQD; 1 Dose by In Vitro route daily as needed. -     Continuous Glucose Receiver (DEXCOM G7 RECEIVER) DEVI; Use as instructed. Check blood glucose level by fingerstick three times per day. -     Continuous Glucose Sensor (DEXCOM G7 SENSOR) MISC; Use as instructed. Check blood glucose level by fingerstick three times per day.  E11.65  Diabetes mellitus treated with injections of non-insulin  medication (HCC) -     Dulaglutide  (TRULICITY ) 0.75 MG/0.5ML SOAJ; Inject 0.75 mg into the skin once a week. BMI 38  Urine frequency -     Urinalysis, Complete -     Microscopic Examination .    Patient has been counseled on age-appropriate routine health concerns for screening and prevention. These are reviewed and up-to-date. Referrals have been placed accordingly. Immunizations are up-to-date or declined.    Subjective:   Chief Complaint  Patient presents with   Medical Management of Chronic Issues    Candice [name] 48 y.o. female presents to office   VRI was used to communicate directly with patient for the entire encounter including providing detailed patient instructions.     She has a past medical history of Hypertension, Irritable bowel syndrome, and Prediabetes.    She stopped taking trulicity  stating it caused her to have breast pain but today she requests to restart due to elevated A1c. She is currently prescribed metformin  1000 mg BID. We will restart her on insulin  as well today as She had stopped  taking this as well.  We have had numerous discussions over the years about her diabetes and the complications of poorly  controlled diabetes. She continues to have reasons at every office visit as to why she is not taking certain medications whether for side effects, financial issues or personal reasons. She endorses urinary frequency today which I have explained to her is likely due to the elevated A1c and glucose levels.  Lab Results  Component Value Date   HGBA1C >15 12/31/2023      HTN Blood pressure is well controlled.  BP Readings from Last 3 Encounters:  01/01/24 114/72  12/31/23 124/84  12/05/23 129/87     She was warned that if someone scathced her on her arm she would bleed to death So she did not start her freestyle.    DM 2 She stopped taking trulicity  and insulin  as she was having back pain and abdominal pain. Symptoms continued despite stopping medications so not the issue.  Lab Results  Component Value Date   HGBA1C >15 12/31/2023    Lab Results  Component Value Date   HGBA1C 10.4 (A) 10/02/2023     Review of Systems  Constitutional:  Negative for fever, malaise/fatigue and weight loss.  HENT: Negative.  Negative for nosebleeds.   Eyes: Negative.  Negative for blurred vision, double vision and photophobia.  Respiratory: Negative.  Negative for cough and shortness of breath.   Cardiovascular: Negative.  Negative for chest pain, palpitations and leg swelling.  Gastrointestinal: Negative.  Negative for heartburn, nausea and vomiting.  Genitourinary:  Positive for frequency.  Musculoskeletal: Negative.  Negative  for myalgias.  Neurological: Negative.  Negative for dizziness, focal weakness, seizures and headaches.  Psychiatric/Behavioral: Negative.  Negative for suicidal ideas.     Past Medical History:  Diagnosis Date   Fatty liver    Hypertension    Irritable bowel syndrome (IBS)    Prediabetes     Past Surgical History:  Procedure Laterality Date   CIRCUMCISION  as a young girl    Type III femake circumcision    DILATION AND CURETTAGE OF UTERUS  2006   HEMORRHOID  SURGERY  2015   HE[place]ID [place]     [place]    Family History  Problem Relation Age of Onset   Diabetes Mother    Hypertension Father    High blood pressure Maternal Grandmother     Social History Reviewed with no changes to be made today.   Outpatient Medications Prior to Visit  Medication Sig Dispense Refill   amLODipine  (NORVASC ) 5 MG tablet Take 1 tablet (5 mg total) by mouth daily. 90 tablet 1   atorvastatin  (LIPITOR) 40 MG tablet Take 1 tablet (40 mg total) by mouth daily. 90 tablet 3   Blood Glucose Monitoring Suppl (ONETOUCH VERIO REFLECT) w/Device KIT use daily as directed 1 kit 0   DULoxetine  (CYMBALTA ) 60 MG capsule TAKE 1 CAPSULE BY MOUTH EVERY DAY (Patient not taking: Reported on 01/01/2024) 90 capsule 1   gabapentin  (NEURONTIN ) 300 MG capsule Take 1 capsule (300 mg total) by mouth 3 (three) times daily. 90 capsule 3   glucose blood (ONETOUCH VERIO) test strip Use to check blood sugar 3-4 times daily. E11.69 100 each 2   ibuprofen  (ADVIL ) 600 MG tablet Take 1 tablet (600 mg total) by mouth every 6 (six) hours as needed. 30 tablet 0   Insulin  Pen Needle (B-D UF III MINI PEN NEEDLES) 31G X 5 MM MISC Use as instructed. Inject into the skin once nightly. 90 each 1   TRUEplus Lancets 28G MISC Use as instructed. Check blood glucose level by fingerstick three times per day. 200 each 3   diclofenac  Sodium (VOLTAREN ) 1 % GEL Apply 4 g topically 4 (four) times daily. (Patient not taking: Reported on 01/01/2024) 150 g 4   fluticasone  (FLONASE ) 50 MCG/ACT nasal spray Place 2 sprays into both nostrils daily. 16 g 6   Insulin  Glargine (BASAGLAR  KWIKPEN) 100 UNIT/ML Inject 10 Units into the skin at bedtime. 15 mL 0   metFORMIN  (GLUCOPHAGE ) 500 MG tablet Take 2 tablets (1,000 mg total) by mouth 2 (two) times daily with a meal. 360 tablet 1   Continuous Glucose Receiver (DEXCOM G7 RECEIVER) DEVI Use as instructed. Check blood glucose level by fingerstick three times per day. (Patient not  taking: Reported on 12/31/2023) 1 each 0   Continuous Glucose Sensor (DEXCOM G7 SENSOR) MISC Use as instructed. Check blood glucose level by fingerstick three times per day.  E11.65 (Patient not taking: Reported on 12/31/2023) 2 each 3   Dulaglutide  (TRULICITY ) 0.75 MG/0.5ML SOAJ Inject 0.75 mg into the skin once a week. (Patient not taking: Reported on 12/31/2023) 2 mL 1   No facility-administered medications prior to visit.    No Known Allergies     Objective:    BP 124/84 (BP Location: Left Arm, Patient Position: Sitting, Cuff Size: Large)   Pulse 96   Resp 18   Ht 5\' 4"  (1.626 m)   Wt 220 lb (99.8 kg)   SpO2 95%   BMI 37.76 kg/m  Wt Readings from Last  3 Encounters:  01/01/24 220 lb (99.8 kg)  12/31/23 220 lb (99.8 kg)  12/05/23 222 lb (100.7 kg)    Physical Exam Vitals and nursing note reviewed.  Constitutional:      Appearance: She is well-developed.  HENT:     Head: Normocephalic and atraumatic.  Cardiovascular:     Rate and Rhythm: Normal rate and regular rhythm.     Heart sounds: Normal heart sounds. No murmur heard.    No friction rub. No gallop.  Pulmonary:     Effort: Pulmonary effort is normal. No tachypnea or respiratory distress.     Breath sounds: Normal breath sounds. No decreased breath sounds, wheezing, rhonchi or rales.  Chest:     Chest wall: No tenderness.  Abdominal:     General: Bowel sounds are normal.     Palpations: Abdomen is soft.  Musculoskeletal:        General: Normal range of motion.     Cervical back: Normal range of motion.  Skin:    General: Skin is warm and dry.  Neurological:     Mental Status: She is alert and oriented to person, place, and time.     Coordination: Coordination normal.  Psychiatric:        Behavior: Behavior normal. Behavior is cooperative.        Thought Content: Thought content normal.        Judgment: Judgment normal.          Patient has been counseled extensively about nutrition and exercise as well  as the importance of adherence with medications and regular follow-up. The patient was given clear instructions to go to ER or return to medical center if symptoms don't improve, worsen or new problems develop. The patient verbalized understanding.   Follow-up: Return for 4-6 weeks meter check with LUKE see me in 3 months.   Collins Dean, FNP-BC [facility] [place], [place] [phone]   01/20/2024, 11:46 PM

## 2024-01-01 ENCOUNTER — Encounter: Payer: Self-pay | Admitting: Nurse Practitioner

## 2024-01-01 ENCOUNTER — Other Ambulatory Visit: Payer: Self-pay | Admitting: Nurse Practitioner

## 2024-01-01 ENCOUNTER — Ambulatory Visit: Admitting: Gastroenterology

## 2024-01-01 ENCOUNTER — Encounter: Payer: Self-pay | Admitting: Gastroenterology

## 2024-01-01 VITALS — BP 114/72 | HR 100 | Ht 64.0 in | Wt 220.0 lb

## 2024-01-01 DIAGNOSIS — K5904 Chronic idiopathic constipation: Secondary | ICD-10-CM

## 2024-01-01 DIAGNOSIS — E1165 Type 2 diabetes mellitus with hyperglycemia: Secondary | ICD-10-CM

## 2024-01-01 DIAGNOSIS — R143 Flatulence: Secondary | ICD-10-CM

## 2024-01-01 DIAGNOSIS — Z1211 Encounter for screening for malignant neoplasm of colon: Secondary | ICD-10-CM

## 2024-01-01 DIAGNOSIS — K760 Fatty (change of) liver, not elsewhere classified: Secondary | ICD-10-CM | POA: Diagnosis not present

## 2024-01-01 DIAGNOSIS — R1310 Dysphagia, unspecified: Secondary | ICD-10-CM

## 2024-01-01 DIAGNOSIS — R748 Abnormal levels of other serum enzymes: Secondary | ICD-10-CM

## 2024-01-01 DIAGNOSIS — E1169 Type 2 diabetes mellitus with other specified complication: Secondary | ICD-10-CM

## 2024-01-01 LAB — CMP14+EGFR
ALT: 23 IU/L (ref 0–32)
AST: 19 IU/L (ref 0–40)
Albumin: 3.8 g/dL — ABNORMAL LOW (ref 3.9–4.9)
Alkaline Phosphatase: 263 IU/L — ABNORMAL HIGH (ref 44–121)
BUN/Creatinine Ratio: 21 (ref 9–23)
BUN: 13 mg/dL (ref 6–24)
Bilirubin Total: <0.2 mg/dL (ref 0.0–1.2)
CO2: 23 mmol/L (ref 20–29)
Calcium: 9.4 mg/dL (ref 8.7–10.2)
Chloride: 96 mmol/L (ref 96–106)
Creatinine, Ser: 0.62 mg/dL (ref 0.57–1.00)
Globulin, Total: 3.4 g/dL (ref 1.5–4.5)
Glucose: 421 mg/dL — ABNORMAL HIGH (ref 70–99)
Potassium: 4.5 mmol/L (ref 3.5–5.2)
Sodium: 135 mmol/L (ref 134–144)
Total Protein: 7.2 g/dL (ref 6.0–8.5)
eGFR: 112 mL/min/{1.73_m2} (ref >59)

## 2024-01-01 LAB — MICROSCOPIC EXAMINATION
Casts: NONE SEEN /LPF
WBC, UA: NONE SEEN /HPF (ref 0–5)

## 2024-01-01 LAB — URINALYSIS, COMPLETE
Bilirubin, UA: NEGATIVE
Leukocytes,UA: NEGATIVE
Nitrite, UA: NEGATIVE
Protein,UA: NEGATIVE
RBC, UA: NEGATIVE
Specific Gravity, UA: >=1.03 — AB (ref 1.005–1.030)
Urobilinogen, Ur: 0.2 mg/dL (ref 0.2–1.0)
pH, UA: 6 (ref 5.0–7.5)

## 2024-01-01 MED ORDER — BASAGLAR KWIKPEN 100 UNIT/ML ~~LOC~~ SOPN: 10.0000 [IU] | PEN_INJECTOR | Freq: Every day | SUBCUTANEOUS | 1 refills | Status: AC

## 2024-01-01 NOTE — Progress Notes (Signed)
 Chief Complaint: colon cancer screening, elevated LFTs, and hepatic steatosis. Primary GI Doctor:Dr. Leonides Schanz  HPI: 48 year old female with history of IBS, hemorrhoids s/p hemorrhoidectomy in 2014, and HTN, referred by Bertram Denver, NP, on 11/18/2023, for the complaint of colon cancer screening, elevated LFTs, and hepatic steatosis.  08/02/2022 patient last seen in GI office by Dr. Leonides Schanz.  Patient had complaints of constipation with bloating as well as rectal bleeding from straining.  Patient also had generalized abdominal pain.  As well as dysphagia to liquids for a long time.  Denies dysphagia to solids.  Dr. Leonides Schanz at that time recommended EGD and colonoscopy.  Patient declined and had appointment with ENT.  For the elevated alk phos Dr. Leonides Schanz recommended further workup but patient was not interested in lab work evaluation or imaging at this time.  Due to language barrier, an interpreter was present during the history-taking and subsequent discussion (and for part of the physical exam) with this patient.   Interval History    Patient presents to discuss colon screening colonoscopy and hepatic steatosis.  Patient has bowel movement every day but states often times small amount. She at times does not feel she empties out. She trialed the Miralax recommended last visit and it gave her severe diarrhea so she stopped it. She also has a lot of gas.  She admits she drinks a lot of carbonated beverages and eats a lot of gas-forming foods.  Patient denies rectal bleeding. Complains of left back pain that radiates to the front of her abdomen. She does have back issues and states it can radiate down her leg.       She also continues with dysphagia to liquids. Denies dysphagia to solids. She does not have any chest burning or regurgitation. Dr. Leonides Schanz recommended EGD last visit to evaluate but patient wanted to see ENT prior which she did. She also complains of periumbilical burning, worse with spicy  food.  No alcohol use. Nonsmoker.   OTC tylenol prn, very seldom.   No family history of liver issues or CA.  Wt Readings from Last 3 Encounters:  01/01/24 220 lb (99.8 kg)  12/31/23 220 lb (99.8 kg)  12/05/23 222 lb (100.7 kg)    Past Medical History:  Diagnosis Date   Fatty liver    Hypertension    Irritable bowel syndrome (IBS)    Prediabetes    Past Surgical History:  Procedure Laterality Date   CIRCUMCISION  as a young girl    Type III femake circumcision    DILATION AND CURETTAGE OF UTERUS  2006   HEMORRHOID SURGERY  2015   HEMORRHOID SURGERY     in [place]   Current Outpatient Medications  Medication Sig Dispense Refill   amLODipine (NORVASC) 5 MG tablet Take 1 tablet (5 mg total) by mouth daily. 90 tablet 1   atorvastatin (LIPITOR) 40 MG tablet Take 1 tablet (40 mg total) by mouth daily. 90 tablet 3   Blood Glucose Calibration (ONETOUCH ULTRA CONTROL) LIQD 1 Dose by In Vitro route daily as needed. 1 each PRN   Blood Glucose Monitoring Suppl (ONETOUCH VERIO REFLECT) w/Device KIT use daily as directed 1 kit 0   Continuous Glucose Receiver (DEXCOM G7 RECEIVER) DEVI Use as instructed. Check blood glucose level by fingerstick three times per day. 1 each 0   Continuous Glucose Sensor (DEXCOM G7 SENSOR) MISC Use as instructed. Check blood glucose level by fingerstick three times per day.  E11.65 2 each 3  Dulaglutide (TRULICITY) 0.75 MG/0.5ML SOAJ Inject 0.75 mg into the skin once a week. 6 mL 1   fluticasone (FLONASE) 50 MCG/ACT nasal spray Place 2 sprays into both nostrils daily. 16 g 6   gabapentin (NEURONTIN) 300 MG capsule Take 1 capsule (300 mg total) by mouth 3 (three) times daily. 90 capsule 3   glucose blood (ONETOUCH VERIO) test strip Use to check blood sugar 3-4 times daily. E11.69 100 each 2   ibuprofen (ADVIL) 600 MG tablet Take 1 tablet (600 mg total) by mouth every 6 (six) hours as needed. 30 tablet 0   Insulin Glargine (BASAGLAR KWIKPEN) 100 UNIT/ML Inject 10  Units into the skin at bedtime. 15 mL 0   Insulin Pen Needle (B-D UF III MINI PEN NEEDLES) 31G X 5 MM MISC Use as instructed. Inject into the skin once nightly. 90 each 1   TRUEplus Lancets 28G MISC Use as instructed. Check blood glucose level by fingerstick three times per day. 200 each 3   diclofenac Sodium (VOLTAREN) 1 % GEL Apply 4 g topically 4 (four) times daily. (Patient not taking: Reported on 01/01/2024) 150 g 4   DULoxetine (CYMBALTA) 60 MG capsule TAKE 1 CAPSULE BY MOUTH EVERY DAY (Patient not taking: Reported on 01/01/2024) 90 capsule 1   metFORMIN (GLUCOPHAGE) 500 MG tablet TAKE 2 TABLETS (1,000 MG TOTAL) BY MOUTH 2 (TWO) TIMES DAILY WITH A MEAL. 120 tablet 5   No current facility-administered medications for this visit.    Allergies as of 01/01/2024   (No Known Allergies)    Family History  Problem Relation Age of Onset   Diabetes Mother    Hypertension Father    High blood pressure Maternal Grandmother     Review of Systems:    Constitutional: No weight loss, fever, chills, weakness or fatigue HEENT: Eyes: No change in vision               Ears, Nose, Throat:  No change in hearing or congestion Skin: No rash or itching Cardiovascular: No chest pain, chest pressure or palpitations   Respiratory: No SOB or cough Gastrointestinal: See HPI and otherwise negative Genitourinary: No dysuria or change in urinary frequency Neurological: No headache, dizziness or syncope Musculoskeletal: No new muscle or joint pain Hematologic: No bleeding or bruising Psychiatric: No history of depression or anxiety    Physical Exam:  Vital signs: BP 114/72   Pulse 100   Ht 5\' 4"  (1.626 m)   Wt 220 lb (99.8 kg)   LMP  (LMP Unknown)   SpO2 94%   BMI 37.76 kg/m   Constitutional: Pleasant  female appears to be in NAD, Well developed, Well nourished, alert and cooperative Head:  Normocephalic and atraumatic. Eyes:   PEERL, EOMI. No icterus. Conjunctiva pink. Ears:  Normal auditory  acuity. Neck:  Supple Throat: Oral cavity and pharynx without inflammation, swelling or lesion.  Respiratory: Respirations even and unlabored. Lungs clear to auscultation bilaterally.   No wheezes, crackles, or rhonchi.  Cardiovascular: Normal S1, S2. Regular rate and rhythm. No peripheral edema, cyanosis or pallor.  Gastrointestinal:  Soft, nondistended, nontender. No rebound or guarding. Hypoactive bowel sounds. No appreciable masses or hepatomegaly. Rectal:  Not performed.  Msk:  Symmetrical without gross deformities. Without edema, no deformity or joint abnormality.  Neurologic:  Alert and  oriented x4;  grossly normal neurologically.  Skin:   Dry and intact without significant lesions or rashes. Psychiatric: Oriented to person, place and time. Demonstrates good judgement and reason  without abnormal affect or behaviors.  RELEVANT LABS AND IMAGING: CBC    Latest Ref Rng & Units 12/05/2023    1:05 PM 12/05/2023   12:35 PM 10/02/2023   12:27 PM  CBC  WBC 4.0 - 10.5 K/uL  6.3  10.2   Hemoglobin 12.0 - 15.0 g/dL 40.9  81.1  91.4   Hematocrit 36.0 - 46.0 % 42.0  44.0  44.0   Platelets 150 - 400 K/uL  296  347      CMP     Latest Ref Rng & Units 12/31/2023   12:11 PM 12/05/2023    1:05 PM 12/05/2023   12:35 PM  CMP  Glucose 70 - 99 mg/dL 782   956   BUN 6 - 24 mg/dL 13   12   Creatinine 2.13 - 1.00 mg/dL 0.86   5.78   Sodium 469 - 144 mmol/L 135  133  129   Potassium 3.5 - 5.2 mmol/L 4.5  3.7  4.2   Chloride 96 - 106 mmol/L 96   91   CO2 20 - 29 mmol/L 23   25   Calcium 8.7 - 10.2 mg/dL 9.4   9.9   Total Protein 6.0 - 8.5 g/dL 7.2   7.7   Total Bilirubin 0.0 - 1.2 mg/dL <6.2   0.3   Alkaline Phos 44 - 121 IU/L 263   262   AST 0 - 40 IU/L 19   22   ALT 0 - 32 IU/L 23   27      Lab Results  Component Value Date   TSH 0.864 02/22/2023  12/05/2023 CT abdomen pelvis with contrast IMPRESSION: 1. No acute abnormality or explanation for left lower quadrant pain. 2. Hepatomegaly  with advanced hepatic steatosis. 10/12/2023 ultrasound abdomen limited right upper quadrant IMPRESSION: 1. Increased hepatic parenchymal echogenicity suggestive of steatosis. 2. No cholelithiasis or sonographic evidence for acute cholecystitis.  Assessment: Encounter Diagnoses  Name Primary?   Hepatic steatosis Yes   Dysphagia, unspecified type    Special screening for malignant neoplasms, colon    Chronic idiopathic constipation    Flatulence    Elevated alkaline phosphatase level       48 year old female patient with elevated  alk phos since 09/2021, has progressively increased over the past 2 years >>125>263.  Albumin has been consistently low dating back as early as 2019>>3.2>3.8. Normal AST, ALT, total Bili. Normal lipase 46. Abd [place] and CT scan ABD/pelvis shows hepatic steatosis, No cholelithiasis or sonographic evidence for acute cholecystitis. No splenomegaly. No ascites. Recent Ha1c 10.4, poorly controlled sugars. Admits she consumes a lot of carbohydrates, bread, sweets. No alcohol use. 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 We discussed dietary changes, increasing physical activity and weight loss. We discussed cutting out bread and sweets.   For the dysphagia we discussed scheduling upper GI endoscopy to evaluate for esophagitis, stricture, or web.  The left-sided back pain that radiates to her abdomen is most likely MSK in nature and recommend she discuss with PCP and Othel Dicostanzo require back imaging.   Patient due for colon screening colonoscopy , will schedule with extra prep. Reinforced high fiber diet and adding OCT Citrucel to regulate bowels. She can use OTC Gas-X prn for gas and avoid carbonated drinks, straws, or gas forming foods.  Plan: - Check liver serology ANA, AMA, Anti-smooth muscle antibody, Hepatitis A IgG,Hepatitis B surface antigen, Hepatitis B surface antibody, HCV antibody, ferritin, TIBC,  Alpha 1 antitrypsin, ceruloplasmin, tTG,  total IgA, PT/INR, IgG -OTC Gas X for  gas  - Recommend High fiber diet - Start Citrucel 1 tsp po daily  - Schedule for a colonoscopy in [facility] with Dr. Rosaline Coma with extra prep. The risks and benefits of colonoscopy with possible polypectomy / biopsies were discussed and the patient agrees to proceed.  -Schedule EGD in [facility] with Dr. Rosaline Coma. The risks and benefits of EGD with possible biopsies and esophageal dilation were discussed with the patient who agrees to proceed.  Thank you for the courtesy of this consult. Please call me with any questions or concerns.   Taisley Mordan, FNP-C [facility] 01/01/2024, 12:02 PM  Cc: Collins Dean, NP

## 2024-01-01 NOTE — Progress Notes (Signed)
 I agree with the assessment and plan as outlined by Ms. May.

## 2024-01-01 NOTE — Patient Instructions (Addendum)
 Over The Counter Gas X for gas  Recommend High fiber diet Start Citrucel 1 teaspoon po daily for constipation  Your provider has requested that you go to the basement level for lab work before leaving today. Press "B" on the elevator. The lab is located at the first door on the left as you exit the elevator.   _______________________________________________________  If your blood pressure at your visit was 140/90 or greater, please contact your primary care physician to follow up on this.  _______________________________________________________  If you are age 46 or older, your body mass index should be between 23-30. Your Body mass index is 37.76 kg/m. If this is out of the aforementioned range listed, please consider follow up with your Primary Care Provider.  If you are age 59 or younger, your body mass index should be between 19-25. Your Body mass index is 37.76 kg/m. If this is out of the aformentioned range listed, please consider follow up with your Primary Care Provider.   ________________________________________________________  The [facility] providers would like to encourage you to use MYCHART to communicate with providers for non-urgent requests or questions.  Due to long hold times on the telephone, sending your provider a message by [facility] may be a faster and more efficient way to get a response.  Please allow 48 business hours for a response.  Please remember that this is for non-urgent requests.  _______________________________________________________

## 2024-01-17 ENCOUNTER — Encounter: Admitting: Internal Medicine

## 2024-01-20 ENCOUNTER — Encounter: Payer: Self-pay | Admitting: Nurse Practitioner

## 2024-01-29 ENCOUNTER — Ambulatory Visit (INDEPENDENT_AMBULATORY_CARE_PROVIDER_SITE_OTHER): Admitting: Adult Health

## 2024-01-29 ENCOUNTER — Encounter (INDEPENDENT_AMBULATORY_CARE_PROVIDER_SITE_OTHER): Payer: Self-pay | Admitting: Adult Health

## 2024-01-29 VITALS — BP 117/81 | HR 88 | Temp 97.8°F | Ht 60.0 in | Wt 212.0 lb

## 2024-01-29 DIAGNOSIS — E559 Vitamin D deficiency, unspecified: Secondary | ICD-10-CM | POA: Diagnosis not present

## 2024-01-29 DIAGNOSIS — E1169 Type 2 diabetes mellitus with other specified complication: Secondary | ICD-10-CM

## 2024-01-29 DIAGNOSIS — Z794 Long term (current) use of insulin: Secondary | ICD-10-CM

## 2024-01-29 DIAGNOSIS — Z7984 Long term (current) use of oral hypoglycemic drugs: Secondary | ICD-10-CM

## 2024-01-29 DIAGNOSIS — Z0289 Encounter for other administrative examinations: Secondary | ICD-10-CM

## 2024-01-29 DIAGNOSIS — Z7985 Long-term (current) use of injectable non-insulin antidiabetic drugs: Secondary | ICD-10-CM

## 2024-01-29 DIAGNOSIS — Z6841 Body Mass Index (BMI) 40.0 and over, adult: Secondary | ICD-10-CM | POA: Diagnosis not present

## 2024-01-29 DIAGNOSIS — Z0000 Encounter for general adult medical examination without abnormal findings: Secondary | ICD-10-CM

## 2024-01-29 NOTE — Progress Notes (Signed)
 Office: [phone]  /  Fax: [phone]   Initial Visit  Candice [name] was seen in clinic today to evaluate for obesity. She is interested in losing weight to improve overall health and reduce the risk of weight related complications. She presents today to review program treatment options, initial physical assessment, and evaluation.     Discussed the use of AI scribe software for clinical note transcription with the patient, who gave verbal consent to proceed.  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 She states: Adopt healthier eating patterns, Improve energy levels and physical activity, Improve existing medical conditions, Reduce number of medications, Improve quality of life, Improve appearance, and Improve self-confidence  When asked how has your weight affected you? She states: Contributed to medical problems, Contributed to orthopedic problems or mobility issues, Having fatigue, Having poor endurance, and Problems with eating patterns  Weight history: She reports weight gain since her time at [facility]  Some associated conditions: Hypertension, Hyperlipidemia, Diabetes, and Vitamin D  Deficiency  Contributing factors: Consumption of processed foods, Use of obesogenic medications: Obesogenic diabetes medications, Reduced physical activity, and Eating patterns  Weight promoting medications identified: Obesogenic diabetes medications  Current nutrition plan: None  Current level of physical activity: None  Current or previous pharmacotherapy: GLP-1  Response to medication: Diabetis Ts: Trulivity   Past medical history includes:   Past Medical History:  Diagnosis Date   Fatty liver    Hypertension    Irritable bowel syndrome (IBS)    Prediabetes      Objective:   BP 117/81   Pulse 88   Temp 97.8 F (36.6 C)   Ht 5' (1.524 m)   Wt 212 lb (96.2  kg)   LMP  (LMP Unknown)   SpO2 97%   BMI 41.40 kg/m  She was weighed on the bioimpedance scale: Body mass index is 41.4 kg/m.  Peak Weight:212 , Body Fat%:50.6, Visceral Fat Rating:15, Weight trend over the last 12 months: Unchanged  General:  Alert, oriented and cooperative. Patient is in no acute distress.  Respiratory: Normal respiratory effort, no problems with respiration noted   Gait: able to ambulate independently  Mental Status: Normal mood and affect. Normal behavior. Normal judgment and thought content.   DIAGNOSTIC DATA REVIEWED:  BMET    Component Value Date/Time   NA 135 12/31/2023 1211   K 4.5 12/31/2023 1211   CL 96 12/31/2023 1211   CO2 23 12/31/2023 1211   GLUCOSE 421 (H) 12/31/2023 1211   GLUCOSE 659 (HH) 12/05/2023 1235   BUN 13 12/31/2023 1211   CREATININE 0.62 12/31/2023 1211   CREATININE 0.69 06/20/2021 0959   CREATININE 0.59 04/07/2015 1201   CALCIUM  9.4 12/31/2023 1211   GFRNONAA >60 12/05/2023 1235   GFRNONAA >60 06/20/2021 0959   GFRNONAA >89 10/01/2014 1026   GFRAA >60 06/03/2019 0105   GFRAA >89 10/01/2014 1026   Lab Results  Component Value Date   HGBA1C >15 12/31/2023   HGBA1C 6.30 10/28/2015   No results found for: "INSULIN " CBC    Component Value Date/Time   WBC 6.3 12/05/2023 1235   RBC 5.49 (H) 12/05/2023 1235   HGB 14.3 12/05/2023 1305   HGB 13.4 10/02/2023 1227   HCT 42.0 12/05/2023 1305   HCT 44.0 10/02/2023 1227   PLT 296 12/05/2023 1235   PLT 347 10/02/2023  1227   MCV 80.1 12/05/2023 1235   MCV 85 10/02/2023 1227   MCH 26.2 12/05/2023 1235   MCHC 32.7 12/05/2023 1235   RDW 13.2 12/05/2023 1235   RDW 13.5 10/02/2023 1227   Iron/TIBC/Ferritin/ %Sat No results found for: "IRON", "TIBC", "FERRITIN", "IRONPCTSAT" Lipid Panel     Component Value Date/Time   CHOL 218 (H) 06/25/2023 1123   TRIG 194 (H) 06/25/2023 1123   HDL 59 06/25/2023 1123   CHOLHDL 3.7 06/25/2023 1123   LDLCALC 125 (H) 06/25/2023 1123   Hepatic  Function Panel     Component Value Date/Time   PROT 7.2 12/31/2023 1211   ALBUMIN 3.8 (L) 12/31/2023 1211   AST 19 12/31/2023 1211   AST 14 (L) 06/20/2021 0959   ALT 23 12/31/2023 1211   ALT 13 06/20/2021 0959   ALKPHOS 263 (H) 12/31/2023 1211   BILITOT <0.2 12/31/2023 1211   BILITOT <0.2 (L) 06/20/2021 0959   BILIDIR 0.09 11/14/2023 1112      Component Value Date/Time   TSH 0.864 02/22/2023 1059     Assessment and Plan:   Type 2 diabetes mellitus with obesity (HCC)  Healthcare maintenance  Vitamin D  deficiency  Morbid obesity (HCC), STARTING BMI 41.5    Assessment and Plan      ESTABLISH WITH [facility]   Obesity Treatment / Action Plan:  Patient will work on garnering support from family and friends to begin weight loss journey. Will work on eliminating or reducing the presence of highly palatable, calorie dense foods in the home. Will complete provided nutritional and psychosocial assessment questionnaire before the next appointment. Will be scheduled for indirect calorimetry to determine resting energy expenditure in a fasting state.  This will allow us  to create a reduced calorie, high-protein meal plan to promote loss of fat mass while preserving muscle mass. Counseled on the health benefits of losing 5%-15% of total body weight. Was counseled on nutritional approaches to weight loss and benefits of reducing processed foods and consuming plant-based foods and high quality protein as part of nutritional weight management. Was counseled on pharmacotherapy and role as an adjunct in weight management.   Obesity Education Performed Today:  She was weighed on the bioimpedance scale and results were discussed and documented in the synopsis.  We discussed obesity as a disease and the importance of a more detailed evaluation of all the factors contributing to the disease.  We discussed the importance of long term lifestyle changes which include nutrition, exercise and  behavioral modifications as well as the importance of customizing this to her specific health and social needs.  We discussed the benefits of reaching a healthier weight to alleviate the symptoms of existing conditions and reduce the risks of the biomechanical, metabolic and psychological effects of obesity.  Joyel Chesmore appears to be in the action stage of change and states they are ready to start intensive lifestyle modifications and behavioral modifications.  "25 minutes spent face-to-face with the patient discussing management of disordered eating symptoms, reviewing current medications, and providing strategies for coping with emotional eating." [place] Interpretor at [facility]  Reviewed by clinician on day of visit: allergies, medications, problem list, medical history, surgical history, family history, social history, and previous encounter notes pertinent to obesity diagnosis.  Santhiago Collingsworth d. Dalon Reichart, NP-C

## 2024-02-04 NOTE — Progress Notes (Signed)
    S:    No chief complaint on file.  48 y.o. female who presents for diabetes evaluation, education, and management. Patient arrives in good spirits and presents without any assistance.   Patient was last seen by her PCP, NP Jamal Mays 12/31/2023 and admitted to only taking metformin . She stopped taking Trulicity  due to breast pain and stopped taking insulin . Patient was ordered a Dexcom G7 sensor and receiver. She was restarted on Trulicity  0.75 mg weekly and  Lantus 10 U daily. A1c at that visit: >15%   Today, patient reports nausea with Trulicity  but manageable. She is agreeable to continuing Trulicity  at this time. Reports that her last Trulicity  injection was last Tuesday and will inject today. Patient reports adherence to Lantus and metformin .   Current diabetes medications include: Lantus 10 U daily, Trulicity  0.75 mg weekly, metformin  1000 mg BID (takes two 500 mg tablets BID)  Patient brought in Dexcom G7 to be set up today. Patient reports not checking blood sugar at home.   Patient denies hypoglycemic events.   Patient reports improvement in nocturia since she started her medications  at last PCP visit. Patient denies neuropathy. Patient denies visual changes. Patient reports self foot exams.   Dietary habits: 3 meals a day  Foods: oatmeal Drinks:a lot of water, uses Splenda for tea and coffee, drinks zero soda  Exercise: none reported  Insurance coverage: Aetna  O:  Lab Results  Component Value Date   HGBA1C >15 12/31/2023   There were no vitals filed for this visit.  Lipid Panel     Component Value Date/Time   CHOL 218 (H) 06/25/2023 1123   TRIG 194 (H) 06/25/2023 1123   HDL 59 06/25/2023 1123   CHOLHDL 3.7 06/25/2023 1123   LDLCALC 125 (H) 06/25/2023 1123    Clinical Atherosclerotic Cardiovascular Disease (ASCVD): No  The 10-year ASCVD risk score (Arnett DK, et al., 2019) is: 3.4%   Values used to calculate the score:     Age: 30 years     Sex: Female      Is Non-Hispanic African American: Yes     Diabetic: Yes     Tobacco smoker: No     Systolic Blood Pressure: 117 mmHg     Is BP treated: Yes     HDL Cholesterol: 59 mg/dL     Total Cholesterol: 218 mg/dL   Patient is participating in a Managed Medicaid Plan: No   A/P: Diabetes longstanding currently uncontrolled due to recent A1c above goal of < 7%. Adherence appears to be optimal as patient reports taking her medications as prescribed. Set up her Dexcom G7 so patient will be aware of her blood sugar readings and hopefully improve glycemic control. We will have her return in 1 month for home glucose readings.  - continue with Trulicity  0.75 mg weekly - continue Lantus 10 U daily  - continue metformin  1000 mg BID  - patient was encouraged to bring her phone with her Dexcom readings for next visit.  - provided extensive education on purpose and proper use of Dexcom G7.  -Next A1c anticipated 03/2024.   Follow-up:  Pharmacist 03/10/24 PCP clinic visit 04/01/2024  Georges Kings M.S. PharmD Candidate Class of 2026  [employer]   Marene Shape, PharmD, [place], CPP Clinical Pharmacist [facility] [phone]

## 2024-02-05 ENCOUNTER — Ambulatory Visit: Attending: Family Medicine | Admitting: Pharmacist

## 2024-02-05 ENCOUNTER — Encounter: Payer: Self-pay | Admitting: Pharmacist

## 2024-02-05 VITALS — BP 118/82 | HR 89

## 2024-02-05 DIAGNOSIS — Z794 Long term (current) use of insulin: Secondary | ICD-10-CM | POA: Diagnosis not present

## 2024-02-05 DIAGNOSIS — Z7984 Long term (current) use of oral hypoglycemic drugs: Secondary | ICD-10-CM

## 2024-02-05 DIAGNOSIS — Z7985 Long-term (current) use of injectable non-insulin antidiabetic drugs: Secondary | ICD-10-CM | POA: Diagnosis not present

## 2024-02-05 DIAGNOSIS — E1169 Type 2 diabetes mellitus with other specified complication: Secondary | ICD-10-CM

## 2024-02-05 DIAGNOSIS — E669 Obesity, unspecified: Secondary | ICD-10-CM

## 2024-03-10 ENCOUNTER — Encounter: Payer: Self-pay | Admitting: Pharmacist

## 2024-03-10 ENCOUNTER — Ambulatory Visit: Attending: Nurse Practitioner | Admitting: Pharmacist

## 2024-03-10 DIAGNOSIS — E1169 Type 2 diabetes mellitus with other specified complication: Secondary | ICD-10-CM | POA: Diagnosis not present

## 2024-03-10 DIAGNOSIS — Z7984 Long term (current) use of oral hypoglycemic drugs: Secondary | ICD-10-CM | POA: Diagnosis not present

## 2024-03-10 DIAGNOSIS — Z7985 Long-term (current) use of injectable non-insulin antidiabetic drugs: Secondary | ICD-10-CM | POA: Diagnosis not present

## 2024-03-10 DIAGNOSIS — Z794 Long term (current) use of insulin: Secondary | ICD-10-CM | POA: Diagnosis not present

## 2024-03-10 DIAGNOSIS — E669 Obesity, unspecified: Secondary | ICD-10-CM

## 2024-03-10 NOTE — Progress Notes (Signed)
 ID: [id number], [email or web address]   S:    No chief complaint on file.  48 y.o. female who presents for diabetes evaluation, education, and management. Patient arrives in good spirits and presents without any assistance.   Patient was last seen by her PCP, NP Theotis 12/31/2023 and admitted to only taking metformin . She stopped taking Trulicity  due to breast pain and stopped taking insulin . Patient was ordered a Dexcom G7 sensor and receiver. She was restarted on Trulicity  0.75 mg weekly and  Lantus 10 U daily. A1c at that visit: >15%.  Pharmacy saw her on 02/05/2024. She reported some nausea with Trulicity  that was tolerable. She endorsed adherence to Lantus and metformin . She also brought in her Dexcom CGM and we set this up for her.   Today, patient reports doing well. She is using her Dexcom and endorses adherence to her medications. She brings in her Clarity app for review today. Symptoms have resolved. Namely, she is not as fatigued and her polyuria has resolved. She still feels nauseated with Trulicity  but tells me this is mostly when she eats heavy meals.   Current diabetes medications include: Lantus 10 U daily, Trulicity  0.75 mg weekly, metformin  1000 mg BID (takes 1 tablet BID)   Patient denies hypoglycemic events.   Patient denies any polyuria. Patient denies neuropathy. Patient denies visual changes. Patient reports self foot exams.   Dietary habits: 3 meals a day  Foods: oatmeal Drinks:a lot of water, uses Splenda for tea and coffee, drinks zero soda  Exercise: none reported  Insurance coverage: Aetna  O:  Date of Download: 03/10/2024, 30 day % Time CGM is active: 100% Average Glucose: 179 mg/dL Glucose Management Indicator: 7.6  Time in Goal:  - Time in range 70-180: 62% - Time above range: 37% - Time below range: 1% Observed patterns:  Lab Results  Component Value Date   HGBA1C >15 12/31/2023   There were no vitals filed for this visit.  Lipid Panel     Component  Value Date/Time   CHOL 218 (H) 06/25/2023 1123   TRIG 194 (H) 06/25/2023 1123   HDL 59 06/25/2023 1123   CHOLHDL 3.7 06/25/2023 1123   LDLCALC 125 (H) 06/25/2023 1123    Clinical Atherosclerotic Cardiovascular Disease (ASCVD): No  The 10-year ASCVD risk score (Arnett DK, et al., 2019) is: 3.5%   Values used to calculate the score:     Age: 51 years     Clincally relevant sex: Female     Is Non-Hispanic African American: Yes     Diabetic: Yes     Tobacco smoker: No     Systolic Blood Pressure: 118 mmHg     Is BP treated: Yes     HDL Cholesterol: 59 mg/dL     Total Cholesterol: 218 mg/dL   Patient is participating in a Managed Medicaid Plan: No   A/P: Diabetes longstanding currently uncontrolled due to recent A1c above goal of < 7%. Her CGM report shows remarkable improvement. Adherence appears to be optimal as patient reports taking her medications as prescribed.. Will see her in 2 months.  - Continue with Trulicity  0.75 mg weekly - Continue Lantus 10 U daily  - Increase metformin  1000 mg BID. If GI symptoms present or worsen, patient may decrease to 500 mg BID. She is amenable to this and will let me know. - Patient was encouraged to bring her phone with her Dexcom readings for next visit.  - provided extensive education on purpose and proper use  of Dexcom G7.  -Next A1c anticipated 03/2024.   Follow-up:  Pharmacist  in August. PCP clinic visit 04/01/2024  Herlene Fleeta Morris, PharmD, BCACP, CPP Clinical Pharmacist [facility] [phone]

## 2024-03-17 ENCOUNTER — Encounter (INDEPENDENT_AMBULATORY_CARE_PROVIDER_SITE_OTHER): Payer: Self-pay

## 2024-03-17 ENCOUNTER — Ambulatory Visit (INDEPENDENT_AMBULATORY_CARE_PROVIDER_SITE_OTHER): Admitting: Family Medicine

## 2024-03-26 ENCOUNTER — Other Ambulatory Visit: Payer: Self-pay | Admitting: Nurse Practitioner

## 2024-03-26 DIAGNOSIS — E7800 Pure hypercholesterolemia, unspecified: Secondary | ICD-10-CM

## 2024-03-26 DIAGNOSIS — I10 Essential (primary) hypertension: Secondary | ICD-10-CM

## 2024-03-26 MED ORDER — ATORVASTATIN CALCIUM 40 MG PO TABS: 40.0000 mg | ORAL_TABLET | Freq: Every day | ORAL | 3 refills | Status: AC

## 2024-03-31 ENCOUNTER — Ambulatory Visit (INDEPENDENT_AMBULATORY_CARE_PROVIDER_SITE_OTHER): Admitting: Family Medicine

## 2024-03-31 ENCOUNTER — Telehealth: Payer: Self-pay | Admitting: Nurse Practitioner

## 2024-03-31 NOTE — Telephone Encounter (Signed)
 Called pt to confirm appt. Pt will be present.

## 2024-04-01 ENCOUNTER — Ambulatory Visit: Payer: Self-pay | Attending: Nurse Practitioner | Admitting: Nurse Practitioner

## 2024-04-01 ENCOUNTER — Ambulatory Visit (INDEPENDENT_AMBULATORY_CARE_PROVIDER_SITE_OTHER): Admitting: Family Medicine

## 2024-04-01 ENCOUNTER — Encounter: Payer: Self-pay | Admitting: Nurse Practitioner

## 2024-04-01 VITALS — BP 115/82 | HR 89 | Resp 20 | Ht 60.0 in | Wt 219.7 lb

## 2024-04-01 DIAGNOSIS — Z6841 Body Mass Index (BMI) 40.0 and over, adult: Secondary | ICD-10-CM

## 2024-04-01 DIAGNOSIS — E1169 Type 2 diabetes mellitus with other specified complication: Secondary | ICD-10-CM | POA: Diagnosis not present

## 2024-04-01 DIAGNOSIS — Z7985 Long-term (current) use of injectable non-insulin antidiabetic drugs: Secondary | ICD-10-CM

## 2024-04-01 DIAGNOSIS — L659 Nonscarring hair loss, unspecified: Secondary | ICD-10-CM | POA: Diagnosis not present

## 2024-04-01 DIAGNOSIS — E669 Obesity, unspecified: Secondary | ICD-10-CM

## 2024-04-01 LAB — POCT GLYCOSYLATED HEMOGLOBIN (HGB A1C): Hemoglobin A1C: 8.8 % — AB (ref 4.0–5.6)

## 2024-04-01 MED ORDER — TRULICITY 1.5 MG/0.5ML ~~LOC~~ SOAJ: 1.5000 mg | SUBCUTANEOUS | 0 refills | Status: DC

## 2024-04-01 NOTE — Progress Notes (Signed)
 Assessment & Plan:  [name] was seen today for diabetes and alopecia.  Diagnoses and all orders for this visit:  Type 2 diabetes mellitus with obesity  Type 2 Diabetes Mellitus with improved glycemic control. Current A1c is 8.8%, reduced from over 15%. Target A1c is less than 7%. - Increase Trulicity  dosage. - Continue current insulin  regimen. -     POCT glycosylated hemoglobin (Hb A1C) -     Ambulatory referral to Ophthalmology -     Urine Albumin/Creatinine with ratio (send out) [[id number]] -     Dulaglutide  (TRULICITY ) 1.5 MG/0.5ML SOAJ; Inject 1.5 mg into the skin once a week.  Hair loss -     Thyroid  Panel With TSH -     Ambulatory referral to Dermatology Recent significant hair loss with unclear etiology. Thyroid  fluctuations and elevated liver enzymes noted. - Check thyroid  function tests. - Refer to dermatologist for further evaluation. - Recommend biotin  supplementation. - Continue cholesterol medication. - Encourage weight loss and dietary modifications.  Patient has been counseled on age-appropriate routine health concerns for screening and prevention. These are reviewed and up-to-date. Referrals have been placed accordingly. Immunizations are up-to-date or declined.    Subjective:   Chief Complaint  Patient presents with   Diabetes   Alopecia    Hair loss for a month    Candice [name] 48 y.o. female presents to office today for follow up to DM and with concerns of hair loss   VRI was used to communicate directly with patient for the entire encounter including providing detailed patient instructions.     DM 2 She has made significant improvement in her diabetes with A1c now down to 8.8 from greater than 15. She has been using a Dexcom device to monitor her blood glucose levels, with a ninety-day average of approximately 226 mg/dL, correlating to an estimated A1c of 8.9%.  She is currently administering Trulicity  0.75mg  once a week and insulin  once a day, sometimes in  the morning and sometimes at night. Blood sugars tend to spike after eating anything, regardless of sugar content, but have stabilized over the past week. Lab Results  Component Value Date   HGBA1C 8.8 (A) 04/01/2024     She is experiencing significant hair loss over the past month, describing it as 'a lot' and noting that it comes out when she touches or brushes her hair. No bald patches are present. She has a history of thyroid  fluctuations last year. She has been on cholesterol medication for a long time and takes it daily as well as having fatty liver disease. She is concerned about the possibility of going bald and notes that her hair is thin and dry at the ends despite using oil treatments.  She inquires about the hepatitis B vaccine, expressing fear of vaccines and seeking information about its purpose and necessity.  She reports experiencing a severe headache in the middle of her head while asleep, which occurred twice and then resolved. She is unsure if this is related to her other conditions.  Review of Systems  Constitutional:  Negative for fever, malaise/fatigue and weight loss.  HENT: Negative.  Negative for nosebleeds.   Eyes: Negative.  Negative for blurred vision, double vision and photophobia.  Respiratory: Negative.  Negative for cough and shortness of breath.   Cardiovascular: Negative.  Negative for chest pain, palpitations and leg swelling.  Gastrointestinal: Negative.  Negative for heartburn, nausea and vomiting.  Musculoskeletal: Negative.  Negative for myalgias.  Skin:  See HPI  Neurological:  Positive for headaches. Negative for dizziness, focal weakness and seizures.  Psychiatric/Behavioral: Negative.  Negative for suicidal ideas.     Past Medical History:  Diagnosis Date   Fatty liver    Hypertension    Irritable bowel syndrome (IBS)    Prediabetes     Past Surgical History:  Procedure Laterality Date   CIRCUMCISION  as a young girl    Type III  femake circumcision    DILATION AND CURETTAGE OF UTERUS  2006   HEMORRHOID SURGERY  2015   HEMORRHOID SURGERY     in [place]    Family History  Problem Relation Age of Onset   Diabetes Mother    Hypertension Father    High blood pressure Maternal Grandmother     Social History Reviewed with no changes to be made today.   Outpatient Medications Prior to Visit  Medication Sig Dispense Refill   amLODipine  (NORVASC ) 5 MG tablet TAKE 1 TABLET (5 MG TOTAL) BY MOUTH DAILY. 90 tablet 1   atorvastatin  (LIPITOR) 40 MG tablet Take 1 tablet (40 mg total) by mouth daily. 90 tablet 3   Blood Glucose Calibration (ONETOUCH ULTRA CONTROL) LIQD 1 Dose by In Vitro route daily as needed. 1 each PRN   Blood Glucose Monitoring Suppl (ONETOUCH VERIO REFLECT) w/Device KIT use daily as directed 1 kit 0   Continuous Glucose Receiver (DEXCOM G7 RECEIVER) DEVI Use as instructed. Check blood glucose level by fingerstick three times per day. 1 each 0   Continuous Glucose Sensor (DEXCOM G7 SENSOR) MISC Use as instructed. Check blood glucose level by fingerstick three times per day.  E11.65 2 each 3   diclofenac  Sodium (VOLTAREN ) 1 % GEL Apply 4 g topically.     DULoxetine  (CYMBALTA ) 60 MG capsule TAKE 1 CAPSULE BY MOUTH EVERY DAY 90 capsule 1   gabapentin  (NEURONTIN ) 300 MG capsule Take 1 capsule (300 mg total) by mouth 3 (three) times daily. 90 capsule 3   glucose blood (ONETOUCH VERIO) test strip Use to check blood sugar 3-4 times daily. E11.69 100 each 2   ibuprofen  (ADVIL ) 600 MG tablet Take 1 tablet (600 mg total) by mouth every 6 (six) hours as needed. 30 tablet 0   Insulin  Glargine (BASAGLAR  KWIKPEN) 100 UNIT/ML Inject 10 Units into the skin at bedtime. 15 mL 1   Insulin  Pen Needle (B-D UF III MINI PEN NEEDLES) 31G X 5 MM MISC Use as instructed. Inject into the skin once nightly. 90 each 1   metFORMIN  (GLUCOPHAGE ) 500 MG tablet TAKE 2 TABLETS (1,000 MG TOTAL) BY MOUTH 2 (TWO) TIMES DAILY WITH A MEAL. 120 tablet  5   TRUEplus Lancets 28G MISC Use as instructed. Check blood glucose level by fingerstick three times per day. 200 each 3   Dulaglutide  (TRULICITY ) 0.75 MG/0.5ML SOAJ Inject 0.75 mg into the skin once a week. 6 mL 1   No facility-administered medications prior to visit.    No Known Allergies     Objective:    BP 115/82 (BP Location: Left Arm, Patient Position: Sitting, Cuff Size: Normal)   Pulse 89   Resp 20   Ht 5' (1.524 m)   Wt 219 lb 11.2 oz (99.7 kg)   SpO2 94%   BMI 42.91 kg/m  Wt Readings from Last 3 Encounters:  04/01/24 219 lb 11.2 oz (99.7 kg)  01/29/24 212 lb (96.2 kg)  01/01/24 220 lb (99.8 kg)    Physical Exam Vitals and nursing  note reviewed.  Constitutional:      Appearance: She is well-developed. She is obese.  HENT:     Head: Normocephalic and atraumatic.  Cardiovascular:     Rate and Rhythm: Normal rate and regular rhythm.     Heart sounds: Normal heart sounds. No murmur heard.    No friction rub. No gallop.  Pulmonary:     Effort: Pulmonary effort is normal. No tachypnea or respiratory distress.     Breath sounds: Normal breath sounds. No decreased breath sounds, wheezing, rhonchi or rales.  Chest:     Chest wall: No tenderness.  Abdominal:     General: Bowel sounds are normal.     Palpations: Abdomen is soft.  Musculoskeletal:        General: Normal range of motion.     Cervical back: Normal range of motion.  Skin:    General: Skin is warm and dry.  Neurological:     Mental Status: She is alert and oriented to person, place, and time.     Coordination: Coordination normal.  Psychiatric:        Behavior: Behavior normal. Behavior is cooperative.        Thought Content: Thought content normal.        Judgment: Judgment normal.          Patient has been counseled extensively about nutrition and exercise as well as the importance of adherence with medications and regular follow-up. The patient was given clear instructions to go to ER or  return to medical center if symptoms don't improve, worsen or new problems develop. The patient verbalized understanding.    Discussed the use of AI scribe software for clinical note transcription with the patient, who gave verbal consent to proceed.      Follow-up: Return in about 3 months (around 07/04/2024).   Haze LELON Servant, FNP-BC [facility] [place], [place] [phone]   04/01/2024, 2:43 PM

## 2024-04-01 NOTE — Patient Instructions (Addendum)
  [address] [place], [place] [address] PH# [phone]    Biotin  10,000-20,000 units/mg daily ZINC Vitamin A

## 2024-04-03 LAB — THYROID PANEL WITH TSH
Free Thyroxine Index: 2 (ref 1.2–4.9)
T3 Uptake Ratio: 31 % (ref 24–39)
T4, Total: 6.3 ug/dL (ref 4.5–12.0)
TSH: 0.718 u[IU]/mL (ref 0.450–4.500)

## 2024-04-03 LAB — MICROALBUMIN / CREATININE URINE RATIO
Creatinine, Urine: 145.8 mg/dL
Microalb/Creat Ratio: 7 mg/g{creat} (ref 0–29)
Microalbumin, Urine: 10.1 ug/mL

## 2024-04-14 ENCOUNTER — Ambulatory Visit: Payer: Self-pay | Admitting: Nurse Practitioner

## 2024-04-19 ENCOUNTER — Other Ambulatory Visit: Payer: Self-pay | Admitting: Physical Medicine & Rehabilitation

## 2024-04-28 ENCOUNTER — Other Ambulatory Visit: Payer: Self-pay | Admitting: Physical Medicine & Rehabilitation

## 2024-05-09 ENCOUNTER — Telehealth: Payer: Self-pay | Admitting: Nurse Practitioner

## 2024-05-09 NOTE — Telephone Encounter (Signed)
 Confirmed appt for 8/25

## 2024-05-11 NOTE — Progress Notes (Unsigned)
 S:    Interpreter Asmaa, D [id number]    No chief complaint on file.  48 y.o. female who presents for diabetes evaluation, education, and management. Patient arrives in good spirits and presents without any assistance.  Patient was referred and last seen by Primary Care Provider, Haze Servant, NP, on 04/01/24. At this visit, patient's A1c was 8.8%, which is down from >15%. Patient endorsed noncompliance to Lantus with taking it in the morning or evening. Stated that BG increase no matter what patient eats. Candice [name] increased Trulicity  to 1.5 mg once weekly.  Patient last saw pharmacy on 03/10/34. At last visit, patient experienced N/V with Trulicity  and was compliant with Lantus and metformin . Trulicity  and Lantus were kept the same and metformin  was increased to 1000 mg BID. Patient could titrate down if GI issues were a problem. Patient also started using Dexcom to track her BG.   Today, patient states that she is doing well. Reports adherence to medications. States that she has been seeing low numbers, but would eat something to help bring it up. Requested more refills for Dexcom. Reports that she will be seeing a dietician. Endorses a low event right before appointment, but did not feel any symptoms. Dexcom did fall off this morning. Patient did state that she feels nauseous when taking metformin . Typically takes this before meals  Current diabetes medications include: Trulicity  1.5 mg once weekly, Lantus 10 units at bedtime, metformin  1000 mg BID Current hypertension medications include: amlodipine  5 mg daily Current hyperlipidemia medications include: atorvastatin  40 mg daily  Insurance coverage: [employer]  Patient reports hypoglycemic events.  Patient denies nocturia (nighttime urination).  Patient denies neuropathy (nerve pain). Patient denies visual changes. Patient reports self foot exams.   Dietary habits: 3 meals a day  Foods: oatmeal, banana, only bread once Drinks:a lot of water,  uses Splenda for tea and coffee, drinks zero soda   Exercise: recently started going to the gym  O:   ROS  Physical Exam  7 day average blood glucose: 137 mg/dL  Libre3 CGM Download today 05/12/24 for a 90 day Average Glucose: 151 mg/dL Glucose Management Indicator: 6.9  Time in Goal:  - Time in range 70-180: 82% - Time above range: 17% - Time below range: 1%  Lab Results  Component Value Date   HGBA1C 8.8 (A) 04/01/2024   There were no vitals filed for this visit.  Lipid Panel     Component Value Date/Time   CHOL 218 (H) 06/25/2023 1123   TRIG 194 (H) 06/25/2023 1123   HDL 59 06/25/2023 1123   CHOLHDL 3.7 06/25/2023 1123   LDLCALC 125 (H) 06/25/2023 1123    Clinical Atherosclerotic Cardiovascular Disease (ASCVD): No  The 10-year ASCVD risk score (Arnett DK, et al., 2019) is: 3.1%   Values used to calculate the score:     Age: 14 years     Clincally relevant sex: Female     Is Non-Hispanic African American: Yes     Diabetic: Yes     Tobacco smoker: No     Systolic Blood Pressure: 115 mmHg     Is BP treated: Yes     HDL Cholesterol: 59 mg/dL     Total Cholesterol: 218 mg/dL    A/P: Diabetes longstanding currently uncontrolled with an A1c of 8.8% back in July. Patient feels like medications are helping her diabetes management. Patient is able to verbalize appropriate hypoglycemia management plan. Patient will take a piece of candy to increase her  blood sugar. Medication adherence appears optimal. Based on CGM report and patient's reports, no changes will be made to current regimen. She is interested in increasing Trulicity  to 3 mg and potentially stopping insulin . This will be a conversation at next appointment with Candice [name] and proceeding appointment with pharmacy in November. I would like to see what her updated A1c looks like and how her Dexcom readings look like over the next few months to decide which medications can be taken off. Encouraged patient to keep up the  good work with her management. She is doing a good job staying on top of her blood sugars and medications.   -Continued basal insulin  Lantus (insulin  glargine) 10 units daily in the evening.  -Continued GLP-1 Trulicity  (dulaglutide ) 1.5 mg once weekly .  -Continued metformin  1000 mg BID with meals.  -Patient educated on purpose, proper use, and potential adverse effects of Trulicity , metformin , and Lantus.  -Extensively discussed pathophysiology of diabetes, recommended lifestyle interventions, dietary effects on blood sugar control.  -Counseled on s/sx of and management of hypoglycemia.  -Next A1c anticipated 06/2024.   Written patient instructions provided. Patient verbalized understanding of treatment plan.  Total time in face to face counseling 60 minutes.    Follow-up:  Pharmacist on 08/12/24 PCP clinic visit on 07/08/24 with Haze Servant, NP  Jenkins Graces, PharmD PGY1 Pharmacy Resident [phone]

## 2024-05-12 ENCOUNTER — Encounter: Payer: Self-pay | Admitting: Pharmacist

## 2024-05-12 ENCOUNTER — Ambulatory Visit: Attending: Family Medicine | Admitting: Pharmacist

## 2024-05-12 DIAGNOSIS — I10 Essential (primary) hypertension: Secondary | ICD-10-CM | POA: Diagnosis not present

## 2024-05-12 DIAGNOSIS — Z794 Long term (current) use of insulin: Secondary | ICD-10-CM

## 2024-05-12 DIAGNOSIS — Z7985 Long-term (current) use of injectable non-insulin antidiabetic drugs: Secondary | ICD-10-CM | POA: Diagnosis not present

## 2024-05-12 DIAGNOSIS — Z7984 Long term (current) use of oral hypoglycemic drugs: Secondary | ICD-10-CM | POA: Diagnosis not present

## 2024-05-12 DIAGNOSIS — E119 Type 2 diabetes mellitus without complications: Secondary | ICD-10-CM

## 2024-05-12 MED ORDER — AMLODIPINE BESYLATE 5 MG PO TABS: 5.0000 mg | ORAL_TABLET | Freq: Every day | ORAL | 1 refills | Status: AC

## 2024-05-12 MED ORDER — DEXCOM G7 SENSOR MISC: 3 refills | Status: AC

## 2024-05-26 ENCOUNTER — Ambulatory Visit (INDEPENDENT_AMBULATORY_CARE_PROVIDER_SITE_OTHER): Admitting: Family Medicine

## 2024-05-26 ENCOUNTER — Encounter (INDEPENDENT_AMBULATORY_CARE_PROVIDER_SITE_OTHER): Payer: Self-pay | Admitting: Family Medicine

## 2024-05-26 ENCOUNTER — Telehealth: Payer: Self-pay

## 2024-05-26 ENCOUNTER — Encounter (INDEPENDENT_AMBULATORY_CARE_PROVIDER_SITE_OTHER): Payer: Self-pay

## 2024-05-26 DIAGNOSIS — Z91199 Patient's noncompliance with other medical treatment and regimen due to unspecified reason: Secondary | ICD-10-CM

## 2024-05-26 NOTE — Telephone Encounter (Signed)
 Patient was identified as falling into the [place] Measure - Diabetes.   Patient was: Appointment scheduled with primary care provider in the next 30 days.

## 2024-05-27 NOTE — Progress Notes (Signed)
 No show for appt.

## 2024-06-04 ENCOUNTER — Telehealth (INDEPENDENT_AMBULATORY_CARE_PROVIDER_SITE_OTHER): Payer: Self-pay | Admitting: Adult Health

## 2024-06-04 NOTE — Telephone Encounter (Signed)
 Patient called because she received a bill for the no show fee attached to the New Patient appointment scheduled for 9/8. Patient added her brother on a conference call to negotiate waving the fee.  The fee is unable to be waived per policy and patient no showed a New Patient appointment on 06/30. This was not a satisfactory solution for them and they claimed charging them the fee was unfair because the policy was never told to them. I explained to them that during the Consultation (then Info Session) on 05/13, the policy was explained and that it was also reiterated in the New Patient paperwork.They then requested things like donations to pay the fee and accused me personally of sending the unpaid bill to collections before stating that they would like all further appointments cancelled.   They escalated in volume and anger over the course of the 20 minute phone call. I warned them that if they continued at that level that the call would be disconnected and they ramped up further. I told them that I will cancel all appointments at their request before I ended the call.

## 2024-06-09 ENCOUNTER — Ambulatory Visit (INDEPENDENT_AMBULATORY_CARE_PROVIDER_SITE_OTHER): Admitting: Family Medicine

## 2024-06-25 ENCOUNTER — Other Ambulatory Visit: Payer: Self-pay | Admitting: Physical Medicine & Rehabilitation

## 2024-06-27 ENCOUNTER — Other Ambulatory Visit: Payer: Self-pay | Admitting: Physical Medicine & Rehabilitation

## 2024-07-01 ENCOUNTER — Ambulatory Visit (INDEPENDENT_AMBULATORY_CARE_PROVIDER_SITE_OTHER): Admitting: Nurse Practitioner

## 2024-07-02 ENCOUNTER — Other Ambulatory Visit: Payer: Self-pay | Admitting: Physical Medicine & Rehabilitation

## 2024-07-02 ENCOUNTER — Other Ambulatory Visit: Payer: Self-pay | Admitting: Nurse Practitioner

## 2024-07-02 DIAGNOSIS — E669 Obesity, unspecified: Secondary | ICD-10-CM

## 2024-07-08 ENCOUNTER — Ambulatory Visit: Attending: Nurse Practitioner | Admitting: Nurse Practitioner

## 2024-07-08 ENCOUNTER — Encounter: Payer: Self-pay | Admitting: Nurse Practitioner

## 2024-07-08 VITALS — BP 114/80 | HR 98 | Resp 19 | Ht 60.0 in | Wt 213.4 lb

## 2024-07-08 DIAGNOSIS — Z1211 Encounter for screening for malignant neoplasm of colon: Secondary | ICD-10-CM

## 2024-07-08 DIAGNOSIS — K089 Disorder of teeth and supporting structures, unspecified: Secondary | ICD-10-CM | POA: Diagnosis not present

## 2024-07-08 DIAGNOSIS — E119 Type 2 diabetes mellitus without complications: Secondary | ICD-10-CM

## 2024-07-08 DIAGNOSIS — Z7984 Long term (current) use of oral hypoglycemic drugs: Secondary | ICD-10-CM

## 2024-07-08 DIAGNOSIS — Z794 Long term (current) use of insulin: Secondary | ICD-10-CM | POA: Diagnosis not present

## 2024-07-08 DIAGNOSIS — M545 Low back pain, unspecified: Secondary | ICD-10-CM

## 2024-07-08 DIAGNOSIS — G8929 Other chronic pain: Secondary | ICD-10-CM

## 2024-07-08 DIAGNOSIS — E7800 Pure hypercholesterolemia, unspecified: Secondary | ICD-10-CM

## 2024-07-08 DIAGNOSIS — E1165 Type 2 diabetes mellitus with hyperglycemia: Secondary | ICD-10-CM | POA: Diagnosis not present

## 2024-07-08 LAB — POCT GLYCOSYLATED HEMOGLOBIN (HGB A1C): HbA1c, POC (controlled diabetic range): 6.2 % (ref 0.0–7.0)

## 2024-07-08 MED ORDER — GABAPENTIN 300 MG PO CAPS
300.0000 mg | ORAL_CAPSULE | Freq: Three times a day (TID) | ORAL | 3 refills | Status: AC
Start: 2024-07-08 — End: ?

## 2024-07-08 MED ORDER — TRULICITY 3 MG/0.5ML ~~LOC~~ SOAJ: 3.0000 mg | SUBCUTANEOUS | 1 refills | Status: DC

## 2024-07-08 NOTE — Progress Notes (Signed)
 Assessment & Plan:  Erdine was seen today for diabetes.  Diagnoses and all orders for this visit:  Diabetes mellitus treated with insulin  and oral medication (HCC) -     POCT glycosylated hemoglobin (Hb A1C) -     Dulaglutide  (TRULICITY ) 3 MG/0.5ML SOAJ; Inject 3 mg as directed once a week. -     CMP14+EGFR Improved glycemic control with A1c reduced to 6.2%. - Continue insulin  therapy. - Increase Trulicity  to 3 mg weekly. - Monitor blood glucose levels at home. - Refill metformin  prescription. - Discuss potential discontinuation of insulin  if A1c improves further with increased Trulicity  dosage. - Discuss side effects of Trulicity  including constipation, nausea, vomiting, and pancreatitis.   Poor dentition -     Ambulatory referral to Dentistry  Hypercholesterolemia -     Lipid panel  Colon cancer screening -     Cancel: Ambulatory referral to Gastroenterology -     Cancel: Cologuard -     Cologuard  Chronic low back pain without sciatica, unspecified back pain laterality -     gabapentin  (NEURONTIN ) 300 MG capsule; Take 1 capsule (300 mg total) by mouth 3 (three) times daily.   General Health Maintenance Mammogram normal last year. Colonoscopy risks discussed. Cologuard offered as alternative. - Order Cologuard test for colon cancer screening. - Check cholesterol, kidney, and liver function tests.   Patient has been counseled on age-appropriate routine health concerns for screening and prevention. These are reviewed and up-to-date. Referrals have been placed accordingly. Immunizations are up-to-date or declined.    Subjective:   Chief Complaint  Patient presents with   Diabetes   VRI was used to communicate directly with patient for the entire encounter including providing detailed patient instructions.    History of Present Illness Candice [name] is a 48 year old female with type 2 diabetes who presents for diabetes management and medication review.  She has a  past medical history of Hypertension, Irritable bowel syndrome, hepatic steatosis, and DM   DM 2 She is currently on insulin  and Trulicity . Her recent A1c has improved from 8.8% to 6.2%, and her blood sugar levels at home range between 120 to 138 mg/dL. She is wondering about the possibility of stopping insulin  at this time. She experiences  increased gas, which she associates with the medication Trulicity  Lab Results  Component Value Date   HGBA1C 6.2 07/08/2024     She discusses issues with her Dexcom sensor, noting signal loss and difficulties with the app. Her sensor recently fell off prior to expiration.  She has placed a new sensor on her arm but is unable to have her reader seen with the sensor.  I was able to walk her through this the reader is now compatible with the current sensor  She expresses concerns about undergoing a colonoscopy due to potential risks and is interested in alternative testing methods due to persistent abdominal pain and gas.  She inquires about dental referrals for her molars.  She had a recent mammogram through her OB GYN last year and is UTD on her pap smear as well per her report.  Both of which were normal   Review of Systems  Constitutional:  Negative for fever, malaise/fatigue and weight loss.  HENT: Negative.  Negative for nosebleeds.   Eyes: Negative.  Negative for blurred vision, double vision and photophobia.  Respiratory: Negative.  Negative for cough and shortness of breath.   Cardiovascular: Negative.  Negative for chest pain, palpitations and leg swelling.  Gastrointestinal:  Positive for abdominal pain. Negative for blood in stool, constipation, diarrhea, heartburn, melena, nausea and vomiting.  Musculoskeletal: Negative.  Negative for myalgias.  Neurological: Negative.  Negative for dizziness, focal weakness, seizures and headaches.  Psychiatric/Behavioral: Negative.  Negative for suicidal ideas.     Past Medical History:  Diagnosis  Date   Fatty liver    Hypertension    Irritable bowel syndrome (IBS)    Prediabetes     Past Surgical History:  Procedure Laterality Date   CIRCUMCISION  as a young girl    Type III femake circumcision    DILATION AND CURETTAGE OF UTERUS  2006   HEMORRHOID SURGERY  2015   HEMORRHOID SURGERY     in [place]    Family History  Problem Relation Age of Onset   Diabetes Mother    Hypertension Father    High blood pressure Maternal Grandmother     Social History Reviewed with no changes to be made today.   Outpatient Medications Prior to Visit  Medication Sig Dispense Refill   amLODipine  (NORVASC ) 5 MG tablet Take 1 tablet (5 mg total) by mouth daily. 90 tablet 1   atorvastatin  (LIPITOR) 40 MG tablet Take 1 tablet (40 mg total) by mouth daily. 90 tablet 3   BD PEN NEEDLE MINI ULTRAFINE 31G X 5 MM MISC USE AS INSTRUCTED. INJECT INTO THE SKIN ONCE NIGHTLY. 100 each 1   Blood Glucose Calibration (ONETOUCH ULTRA CONTROL) LIQD 1 Dose by In Vitro route daily as needed. 1 each PRN   Blood Glucose Monitoring Suppl (ONETOUCH VERIO REFLECT) w/Device KIT use daily as directed 1 kit 0   Continuous Glucose Receiver (DEXCOM G7 RECEIVER) DEVI Use as instructed. Check blood glucose level by fingerstick three times per day. 1 each 0   Continuous Glucose Sensor (DEXCOM G7 SENSOR) MISC Use as instructed. Check blood glucose level by fingerstick three times per day.  E11.65 2 each 3   diclofenac  Sodium (VOLTAREN ) 1 % GEL Apply 4 g topically.     DULoxetine  (CYMBALTA ) 60 MG capsule TAKE 1 CAPSULE BY MOUTH EVERY DAY 90 capsule 1   glucose blood (ONETOUCH VERIO) test strip Use to check blood sugar 3-4 times daily. E11.69 100 each 2   Insulin  Glargine (BASAGLAR  KWIKPEN) 100 UNIT/ML Inject 10 Units into the skin at bedtime. 15 mL 1   metFORMIN  (GLUCOPHAGE ) 500 MG tablet TAKE 2 TABLETS (1,000 MG TOTAL) BY MOUTH 2 (TWO) TIMES DAILY WITH A MEAL. 120 tablet 5   TRUEplus Lancets 28G MISC Use as instructed. Check  blood glucose level by fingerstick three times per day. 200 each 3   Dulaglutide  (TRULICITY ) 1.5 MG/0.5ML SOAJ Inject 1.5 mg into the skin once a week. 6 mL 0   gabapentin  (NEURONTIN ) 300 MG capsule Take 1 capsule (300 mg total) by mouth 3 (three) times daily. 90 capsule 3   ibuprofen  (ADVIL ) 600 MG tablet Take 1 tablet (600 mg total) by mouth every 6 (six) hours as needed. 30 tablet 0   No facility-administered medications prior to visit.    No Known Allergies     Objective:    BP 114/80 (BP Location: Left Arm, Patient Position: Sitting, Cuff Size: Normal)   Pulse 98   Resp 19   Ht 5' (1.524 m)   Wt 213 lb 6.4 oz (96.8 kg)   SpO2 98%   BMI 41.68 kg/m  Wt Readings from Last 3 Encounters:  07/08/24 213 lb 6.4 oz (96.8 kg)  04/01/24  219 lb 11.2 oz (99.7 kg)  01/29/24 212 lb (96.2 kg)    Physical Exam Vitals and nursing note reviewed.  Constitutional:      Appearance: She is well-developed. She is obese.  HENT:     Head: Normocephalic and atraumatic.  Cardiovascular:     Rate and Rhythm: Normal rate and regular rhythm.     Heart sounds: Normal heart sounds. No murmur heard.    No friction rub. No gallop.  Pulmonary:     Effort: Pulmonary effort is normal. No tachypnea or respiratory distress.     Breath sounds: Normal breath sounds. No decreased breath sounds, wheezing, rhonchi or rales.  Chest:     Chest wall: No tenderness.  Abdominal:     General: Bowel sounds are normal.     Palpations: Abdomen is soft.  Musculoskeletal:        General: Normal range of motion.     Cervical back: Normal range of motion.  Skin:    General: Skin is warm and dry.  Neurological:     Mental Status: She is alert and oriented to person, place, and time.     Coordination: Coordination normal.  Psychiatric:        Behavior: Behavior normal. Behavior is cooperative.        Thought Content: Thought content normal.        Judgment: Judgment normal.          Patient has been  counseled extensively about nutrition and exercise as well as the importance of adherence with medications and regular follow-up. The patient was given clear instructions to go to ER or return to medical center if symptoms don't improve, worsen or new problems develop. The patient verbalized understanding.   Follow-up: Return in about 3 months (around 10/16/2024).   Haze LELON Servant, FNP-BC [facility] [place], [place] [phone]   07/08/2024, 3:52 PM

## 2024-07-08 NOTE — Progress Notes (Signed)
Stop insulin.

## 2024-07-09 ENCOUNTER — Ambulatory Visit: Payer: Self-pay | Admitting: Nurse Practitioner

## 2024-07-09 LAB — CMP14+EGFR
ALT: 14 IU/L (ref 0–32)
AST: 12 IU/L (ref 0–40)
Albumin: 4 g/dL (ref 3.9–4.9)
Alkaline Phosphatase: 189 IU/L — ABNORMAL HIGH (ref 41–116)
BUN/Creatinine Ratio: 17 (ref 9–23)
BUN: 11 mg/dL (ref 6–24)
Bilirubin Total: <0.2 mg/dL (ref 0.0–1.2)
CO2: 28 mmol/L (ref 20–29)
Calcium: 10.1 mg/dL (ref 8.7–10.2)
Chloride: 98 mmol/L (ref 96–106)
Creatinine, Ser: 0.63 mg/dL (ref 0.57–1.00)
Globulin, Total: 3.4 g/dL (ref 1.5–4.5)
Glucose: 90 mg/dL (ref 70–99)
Potassium: 4.6 mmol/L (ref 3.5–5.2)
Sodium: 140 mmol/L (ref 134–144)
Total Protein: 7.4 g/dL (ref 6.0–8.5)
eGFR: 111 mL/min/1.73 (ref >59)

## 2024-07-09 LAB — LIPID PANEL
Chol/HDL Ratio: 2.9 ratio (ref 0.0–4.4)
Cholesterol, Total: 183 mg/dL (ref 100–199)
HDL: 63 mg/dL (ref >39)
LDL Chol Calc (NIH): 97 mg/dL (ref 0–99)
Triglycerides: 134 mg/dL (ref 0–149)
VLDL Cholesterol Cal: 23 mg/dL (ref 5–40)

## 2024-07-14 ENCOUNTER — Other Ambulatory Visit: Payer: Self-pay | Admitting: Physical Medicine & Rehabilitation

## 2024-07-15 ENCOUNTER — Ambulatory Visit (INDEPENDENT_AMBULATORY_CARE_PROVIDER_SITE_OTHER): Admitting: Nurse Practitioner

## 2024-07-18 ENCOUNTER — Other Ambulatory Visit: Payer: Self-pay | Admitting: Nurse Practitioner

## 2024-07-18 ENCOUNTER — Encounter: Payer: Self-pay | Admitting: Nurse Practitioner

## 2024-07-18 DIAGNOSIS — Z7984 Long term (current) use of oral hypoglycemic drugs: Secondary | ICD-10-CM

## 2024-07-20 ENCOUNTER — Other Ambulatory Visit: Payer: Self-pay | Admitting: Nurse Practitioner

## 2024-07-20 DIAGNOSIS — N62 Hypertrophy of breast: Secondary | ICD-10-CM

## 2024-07-21 ENCOUNTER — Encounter: Payer: Self-pay | Admitting: Radiology

## 2024-07-21 ENCOUNTER — Other Ambulatory Visit: Payer: Self-pay | Admitting: Family Medicine

## 2024-07-21 ENCOUNTER — Telehealth: Payer: Self-pay | Admitting: Pharmacist

## 2024-07-21 ENCOUNTER — Other Ambulatory Visit: Payer: Self-pay

## 2024-07-21 DIAGNOSIS — E119 Type 2 diabetes mellitus without complications: Secondary | ICD-10-CM

## 2024-07-21 NOTE — Telephone Encounter (Signed)
 Can we submit a PA for this patient's Trulicity?

## 2024-07-22 ENCOUNTER — Other Ambulatory Visit: Payer: Self-pay | Admitting: Family Medicine

## 2024-07-22 ENCOUNTER — Telehealth: Payer: Self-pay

## 2024-07-22 DIAGNOSIS — Z1211 Encounter for screening for malignant neoplasm of colon: Secondary | ICD-10-CM | POA: Diagnosis not present

## 2024-07-22 DIAGNOSIS — E119 Type 2 diabetes mellitus without complications: Secondary | ICD-10-CM

## 2024-07-22 NOTE — Telephone Encounter (Signed)
 Pharmacy Patient Advocate Encounter  Received notification from [facility] that Prior Authorization for TRULICITY  has been APPROVED from 07/22/2024 to 07/22/2025   PA #/Case ID/Reference #: [id number]

## 2024-07-23 ENCOUNTER — Other Ambulatory Visit: Payer: Self-pay | Admitting: Physical Medicine & Rehabilitation

## 2024-07-25 ENCOUNTER — Other Ambulatory Visit: Payer: Self-pay | Admitting: Nurse Practitioner

## 2024-07-26 ENCOUNTER — Other Ambulatory Visit: Payer: Self-pay | Admitting: Nurse Practitioner

## 2024-07-26 DIAGNOSIS — E669 Obesity, unspecified: Secondary | ICD-10-CM

## 2024-07-27 LAB — COLOGUARD: COLOGUARD: NEGATIVE

## 2024-07-28 NOTE — Telephone Encounter (Signed)
 Discontinued 07/08/24.  Requested Prescriptions  Pending Prescriptions Disp Refills   TRULICITY  1.5 MG/0.5ML SOAJ [Pharmacy Med Name: TRULICITY  1.5 MG/0.5 ML PEN]  2    Sig: INJECT 1.5 MG SUBCUTANEOUSLY ONCE A WEEK     Endocrinology:  Diabetes - GLP-1 Receptor Agonists Passed - 07/28/2024  2:10 PM      Passed - HBA1C is between 0 and 7.9 and within 180 days    HbA1c, POC (prediabetic range)  Date Value Ref Range Status  03/16/2021 6.6 (A) 5.7 - 6.4 % Final   HbA1c, POC (controlled diabetic range)  Date Value Ref Range Status  07/08/2024 6.2 0.0 - 7.0 % Final         Passed - Valid encounter within last 6 months    Recent Outpatient Visits           2 weeks ago Diabetes mellitus treated with insulin  and oral medication (HCC)   [facility] Dept Of [place]. [facility] Theotis Haze ORN, NP   2 months ago Long term (current) use of insulin  [facility])   [facility] - A Dept Of [place]. [facility] Fleeta Morris, [place] L, RPH-CPP   3 months ago Type 2 diabetes mellitus with obesity   [facility] - A Dept Of [place]. [facility] [place], [place] W, NP   4 months ago Type 2 diabetes mellitus with obesity   [facility] - A Dept Of [place]. [facility] Fleeta Morris, [place] L, RPH-CPP   5 months ago Type 2 diabetes mellitus with obesity   [facility] - A Dept Of [place]. [facility] Fleeta Morris Garnette LITTIE, RPH-CPP       Future Appointments             In 3 months Alm Delon SAILOR, DO [facility]

## 2024-08-07 ENCOUNTER — Ambulatory Visit

## 2024-08-07 VITALS — BP 151/83 | HR 98 | Ht 61.0 in | Wt 218.0 lb

## 2024-08-07 DIAGNOSIS — M542 Cervicalgia: Secondary | ICD-10-CM

## 2024-08-07 DIAGNOSIS — N62 Hypertrophy of breast: Secondary | ICD-10-CM

## 2024-08-07 DIAGNOSIS — M549 Dorsalgia, unspecified: Secondary | ICD-10-CM | POA: Diagnosis not present

## 2024-08-07 DIAGNOSIS — R21 Rash and other nonspecific skin eruption: Secondary | ICD-10-CM | POA: Diagnosis not present

## 2024-08-07 NOTE — Progress Notes (Signed)
 BREAST REDUCTION CONSULT Plastic & Reconstructive Surgery New Patient Visit  Patient: Candice [name] MRN: [id number] Date: 08/07/2024 Referring Physician: Theotis Haze ORN, NP  Chief Complaint: Symptomatic macromastia, cervicalgia  History of Present Illness:  This is a 48 y.o. woman with PMH and PSH as described below who presents in consultation for breast reduction.  Patient comes accompanied by translator, MA as chaperone.  The patient states she has been considering a breast reduction for years. She describes intermittent skin irritation in the breast folds and occasional rashes.   Back pain in the upper and lower back, including neck pain. She pulls or pins her bra straps to provide better lift and relief of the pressure and pain. She notices relief by holding her breast up manually.  Her shoulder straps cause grooves and pain and pressure that requires padding for relief. Pain medication is sometimes required with motrin  and tylenol .  Activities that are hindered by enlarged breasts include: exercise and running.  She has tried supportive clothing as well as fitted bras without improvement.  She currently wears a unknown cup bra and would ideally like to be a C-D cup.   She has no personal history of breast abnormalities and has never had any breast biopsies or surgeries.  Her last mammogram was last year, she believes it was normal.   She has lost weight since the last time she saw Dr. Waddell.  She no recent weight changes. 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   Past Medical History: Past Medical History:  Diagnosis Date   Fatty liver    Hypertension    Irritable bowel syndrome (IBS)    Prediabetes     Past Surgical History: Past Surgical History:  Procedure Laterality Date   CIRCUMCISION  as a young girl    Type III femake circumcision    DILATION AND CURETTAGE OF UTERUS  2006   HEMORRHOID SURGERY  2015   HEMORRHOID SURGERY     in [place]    Current Medications: Current Outpatient Medications on File Prior to Visit  Medication Sig Dispense Refill   amLODipine  (NORVASC ) 5 MG tablet Take 1 tablet (5 mg total) by mouth daily. 90 tablet 1   atorvastatin  (LIPITOR) 40 MG tablet Take 1 tablet (40 mg total) by mouth daily. 90 tablet 3   BD PEN NEEDLE MINI ULTRAFINE 31G X 5 MM MISC USE AS INSTRUCTED. INJECT INTO THE SKIN ONCE NIGHTLY. 100 each 1   Blood Glucose Calibration (ONETOUCH ULTRA CONTROL) LIQD 1 Dose by In Vitro route daily as needed. 1 each PRN   Blood Glucose Monitoring Suppl (ONETOUCH VERIO REFLECT) w/Device KIT use daily as directed 1 kit 0   Continuous Glucose Receiver (DEXCOM G7 RECEIVER) DEVI Use as instructed. Check blood glucose level by fingerstick three times per day. 1 each 0   Continuous Glucose Sensor (DEXCOM G7 SENSOR) MISC Use as instructed. Check blood glucose level by fingerstick three times per day.  E11.65 2 each 3   diclofenac  Sodium (VOLTAREN ) 1 % GEL Apply 4 g topically.     Dulaglutide  (TRULICITY ) 3 MG/0.5ML SOAJ Inject 3 mg into the skin once a week. 6 mL 1   DULoxetine  (CYMBALTA ) 60 MG capsule TAKE 1 CAPSULE BY MOUTH EVERY DAY 30 capsule 5   gabapentin  (NEURONTIN ) 300 MG capsule Take 1 capsule (300 mg total) by mouth 3 (three) times daily. 90 capsule 3   glucose blood (ONETOUCH VERIO) test strip Use to check blood sugar  3-4 times daily. E11.69 100 each 2   Insulin  Glargine (BASAGLAR  KWIKPEN) 100 UNIT/ML Inject 10 Units into the skin at bedtime. 15 mL 1   metFORMIN  (GLUCOPHAGE ) 500 MG tablet TAKE 2 TABLETS (1,000 MG TOTAL) BY MOUTH 2 (TWO) TIMES DAILY WITH A MEAL. 120 tablet 5   TRUEplus Lancets 28G MISC Use as instructed. Check blood glucose level by fingerstick three times per day. 200 each 3   No current facility-administered medications on file prior to visit.   Allergies: No Known Allergies  Family History:  History of breast cancer negative. Otherwise, family history is negative for  bleeding/clotting disorders, problems with anesthesia, connective tissue disorders.   Social History:  Social History   Socioeconomic History   Marital status: Married    Spouse name: Not on file   Number of children: 0   Years of education: Not on file   Highest education level: Not on file  Occupational History   Not on file  Tobacco Use   Smoking status: Never   Smokeless tobacco: Never  Vaping Use   Vaping status: Never Used  Substance and Sexual Activity   Alcohol use: No   Drug use: No   Sexual activity: Yes    Birth control/protection: None  Other Topics Concern   Not on file  Social History Narrative   Not on file   Social Drivers of Health   Financial Resource Strain: Not on file  Food Insecurity: Not on file  Transportation Needs: Not on file  Physical Activity: Not on file  Stress: Not on file  Social Connections: Not on file   Smoker: Denies Recreational drug use: Denies  Review of systems: 10 point review of systems performed and negative except as noted in the HPI  Physical Exam: BP (!) 151/83 (BP Location: Left Arm, Patient Position: Sitting, Cuff Size: Large)   Pulse 98   Ht 5' 1 (1.549 m)   Wt 218 lb (98.9 kg)   SpO2 96%   BMI 41.19 kg/m  Body mass index is 41.19 kg/m.  Physical Exam           General: Well appearing, no apparent distress. Chest: Chest wall without abnormality or obvious deformity. No scoliosis, pectus excavatum or pectus carinatum. Breast: Bilateral macromastia. Grade 3 ptosis bilaterally. Breast parenchyma is fatty. There are no palpable breast masses in either breast. Bilateral NAC viable and sensate. Papules everted without discharge. NACs positioned along breast meridian bilaterally. Skin quality is poor. There are no skin lesions, scars or dimpling.  - Breast measurements:  Measurements  Right Breast (cm)  Left Breast (cm)  SN-Nipple 45 43  Base Width 20 19  Nipple - IMF 20 20  NAC diameter 11*10 9*10   Internipple Distance 26  Neuro: Moving all four extremities spontaneously.  Psych: Appropriate mood and affect.   Pertinent Labs: Recent Results (from the past 2160 hours)  POCT glycosylated hemoglobin (Hb A1C)     Status: Abnormal   Collection Time: 07/08/24 11:22 AM  Result Value Ref Range   Hemoglobin A1C     HbA1c POC (<> result, manual entry)     HbA1c, POC (prediabetic range)     HbA1c, POC (controlled diabetic range) 6.2 0.0 - 7.0 %  CMP14+EGFR     Status: Abnormal   Collection Time: 07/08/24 12:26 PM  Result Value Ref Range   Glucose 90 70 - 99 mg/dL   BUN 11 6 - 24 mg/dL   Creatinine, Ser 9.36 0.57 -  1.00 mg/dL   eGFR 888 >40 fO/fpw/8.26   BUN/Creatinine Ratio 17 9 - 23   Sodium 140 134 - 144 mmol/L   Potassium 4.6 3.5 - 5.2 mmol/L   Chloride 98 96 - 106 mmol/L   CO2 28 20 - 29 mmol/L   Calcium  10.1 8.7 - 10.2 mg/dL   Total Protein 7.4 6.0 - 8.5 g/dL   Albumin 4.0 3.9 - 4.9 g/dL   Globulin, Total 3.4 1.5 - 4.5 g/dL   Bilirubin Total <9.7 0.0 - 1.2 mg/dL   Alkaline Phosphatase 189 (H) 41 - 116 IU/L   AST 12 0 - 40 IU/L   ALT 14 0 - 32 IU/L  Lipid panel     Status: None   Collection Time: 07/08/24 12:26 PM  Result Value Ref Range   Cholesterol, Total 183 100 - 199 mg/dL   Triglycerides 865 0 - 149 mg/dL   HDL 63 >60 mg/dL   VLDL Cholesterol Cal 23 5 - 40 mg/dL   LDL Chol Calc (NIH) 97 0 - 99 mg/dL   Chol/HDL Ratio 2.9 0.0 - 4.4 ratio    Comment:                                   T. Chol/HDL Ratio                                             Men  Women                               1/2 Avg.Risk  3.4    3.3                                   Avg.Risk  5.0    4.4                                2X Avg.Risk  9.6    7.1                                3X Avg.Risk 23.4   11.0   Cologuard     Status: None   Collection Time: 07/22/24  8:30 AM  Result Value Ref Range   COLOGUARD Negative Negative    Comment: The Cologuard (TM) test was performed on this  specimen.  NEGATIVE TEST RESULT. A negative Cologuard result indicates a low likelihood that a colorectal cancer (CRC) or advanced adenoma (adenomatous polyps with more advanced pre-malignant features) is present. The chance that a person with a negative Cologuard test has a colorectal cancer is less than 1 in 1500 (negative predictive value >99.9%) or has an advanced adenoma is less than 5.3% (negative predictive value 94.7%). These data are based on a prospective cross-sectional study of 10,000 individuals at average risk for colorectal cancer who were screened with both Cologuard and colonoscopy. (Imperiale T. et al, N Engl J Med 2014;370(14):1286-1297) The normal value (reference range) for this assay is negative.  COLOGUARD RE-SCREENING RECOMMENDATION: Periodic colorectal cancer screening is an important part of preventive  healthcare for asymptomatic individuals at average risk for colorectal cancer. Following a negative Cologuard  result, the American Cancer Society and U.S. Multi-Society Task Force screening guidelines recommend a Cologuard re-screening interval of 3 years.  References: American Cancer Society Guideline for Colorectal Cancer Screening: https://www.cancer.org/cancer/colon-rectal-cancer/detection-diagnosis-staging/acs-recommendations.html.; Rex DK, Boland CR, Dominitz JK, Colorectal Cancer Screening: Recommendations for Physicians and Patients from the U.S. Multi-Society Task Force on Colorectal Cancer Screening , Am J Gastroenterology 2017; 112:1016-1030.  TEST DESCRIPTION: Composite algorithmic analysis of stool DNA-biomarkers with hemoglobin immunoassay.   Quantitative values of individual biomarkers are not reportable and are not associated with individual biomarker result reference ranges. Cologuard is intended for colorectal cancer screening of adults of either sex, 45 years or older, who are at average-risk for colorectal cancer (CRC). Cologuard has been approved for use by the  U.[place]  FDA. The performance of Cologuard was established in a cross sectional study of average-risk adults aged 85-84. Cologuard performance in patients ages 93 to 59 years was estimated by sub-group analysis of near-age groups. Colonoscopies performed for a positive result may find as the most clinically significant lesion: colorectal cancer [4.0%], advanced adenoma (including sessile serrated polyps greater than or equal to 1cm diameter) [20%] or non- advanced adenoma [31%]; or no colorectal neoplasia [45%]. These estimates are derived from a prospective cross-sectional screening study of 10,000 individuals at average risk for colorectal cancer who were screened with both Cologuard and colonoscopy. (Imperiale T. et al, LOISE [place] J Med 2014;370(14):1286-1297.) Cologuard may produce a false negative or false positive result (no colorectal cancer or precancerous polyp present at colonoscopy follow up). A negative Cologuard test result does not guarantee the absence of CRC or advanced adenoma  (pre-cancer). The current Cologuard screening interval is every 3 years. Science Writer and [place] Therapist, [employer]). Cologuard performance data in a 10,000 patient pivotal study using colonoscopy as the reference method can be accessed at the following location: www.exactlabs.com/results. Additional description of the Cologuard test process, warnings and precautions can be found at www.cologuard.com.     Pertinent Imaging:  Assessment: In summary, this is a pleasant 48 y.o. year-old woman presenting for consultation for bilateral breast reduction in setting of symptomatic macromastia.   After evaluation today, I believe the patient would be an appropriate candidate for the operation. Based on her Schnur scale, she would require a 700 g reduction per side. I think a 700 g resection would be attainable through a reduction mammoplasty. We discussed the operation in detail including the potential incision  patterns (e.g., vertical only versus wise pattern), and possible need for surgical drains. Based on her clinical exam, she will likely require a wise pattern with inferior pedicle.   We discussed the risks of this procedure which include but are not limited to: bleeding, infection, seroma, delayed wound healing, wound dehiscence, asymmetries, fat necrosis, hypertrophic and keloid scarring, decreased or loss of nipple sensation, partial or full loss of the NAC, numbness, paresthesias, injuries to arteries/nerves/veins, need for revision procedures, further out-of-pocket expenses for ongoing medical care, and potential need for repeat reduction in the future. We discussed that pregnancy can alter the size and shape of her breasts and revision procedures may be required after pregnancy. We discussed that while she has the same potential to breast feed as a woman who has never had a breast reduction, she may have less milk production and may require formula supplementation. We further discussed the risk of DVT/PE, fat embolism, heart attack, stroke, death as well as the  risks of anesthesia. We reviewed the expected recovery period with no heavy activities or lifting >5lbs for 6 weeks postoperatively.   The patient voiced understanding and wishes to proceed. All questions and concerns were addressed to the patient's apparent satisfaction.    Plan: - Mammogram ordered.  - Healthy weight and wellness referral to see if patient can lose some more weight.  - Will needs PCP clearance - Follow up with me in January  The sensitive parts of the examination/procedure were performed with MA as chaperone.  The time documented represents the total time spent on the day of the encounter in preparing for and completing the visit. It does not include time spent by ancillary staff, a resident, a fellow, another trainee, or, for shared visits, time spent jointly with the patient or discussing the case or the performance of  other separately performed services.  Time spent: 45 minutes.     Andrianna Manalang, MD [facility]  08/07/2024 10:31 AM

## 2024-08-12 ENCOUNTER — Ambulatory Visit: Admitting: Pharmacist

## 2024-08-19 ENCOUNTER — Other Ambulatory Visit: Payer: Self-pay | Admitting: Family Medicine

## 2024-09-24 ENCOUNTER — Telehealth: Payer: Self-pay

## 2024-09-24 NOTE — Telephone Encounter (Signed)
 Used interpreter to call to try and r/s Dr CHRISTELLA sx will be longer and we need to move the apt Called 09-24-24

## 2024-09-25 ENCOUNTER — Ambulatory Visit

## 2024-09-26 ENCOUNTER — Ambulatory Visit

## 2024-09-30 ENCOUNTER — Ambulatory Visit

## 2024-09-30 DIAGNOSIS — R21 Rash and other nonspecific skin eruption: Secondary | ICD-10-CM

## 2024-09-30 DIAGNOSIS — Z6841 Body Mass Index (BMI) 40.0 and over, adult: Secondary | ICD-10-CM

## 2024-09-30 DIAGNOSIS — M542 Cervicalgia: Secondary | ICD-10-CM

## 2024-09-30 DIAGNOSIS — N62 Hypertrophy of breast: Secondary | ICD-10-CM | POA: Diagnosis not present

## 2024-09-30 NOTE — Progress Notes (Signed)
" ° °  Established Patient Office Visit  Subjective   Patient ID: Candice [name], female    DOB: [birth year]  Age: 49 y.o. MRN: [id number]  Chief Complaint  Patient presents with   Follow-up   Patient presents for follow-up after discussion on 08/07/2024 for breast reduction.  Since last visit patient has gained weight.  Her current BMI is 41.  Her current BSA is 2.06.  She has not had the mammogram that I have ordered a few months ago.  Comes accompanied by translator.  There is myself as Dr. Joellen Tullos.    Objective:     BP 123/83 (BP Location: Right Wrist, Patient Position: Sitting, Cuff Size: Small)   Pulse 88   Ht 5' 1 (1.549 m)   Wt 219 lb 6.4 oz (99.5 kg)   SpO2 98%   BMI 41.46 kg/m  BP Readings from Last 3 Encounters:  09/30/24 123/83  08/07/24 (!) 151/83  07/08/24 114/80    Physical Exam MA as chaperone Patient alert oriented x 3 Rest of the exam deferred today.  No results found for any visits on 09/30/24.  Last CBC Lab Results  Component Value Date   WBC 6.3 12/05/2023   HGB 14.3 12/05/2023   HCT 42.0 12/05/2023   MCV 80.1 12/05/2023   MCH 26.2 12/05/2023   RDW 13.2 12/05/2023   PLT 296 12/05/2023   Last metabolic panel Lab Results  Component Value Date   GLUCOSE 90 07/08/2024   NA 140 07/08/2024   K 4.6 07/08/2024   CL 98 07/08/2024   CO2 28 07/08/2024   BUN 11 07/08/2024   CREATININE 0.63 07/08/2024   EGFR 111 07/08/2024   CALCIUM  10.1 07/08/2024   PROT 7.4 07/08/2024   ALBUMIN 4.0 07/08/2024   LABGLOB 3.4 07/08/2024   AGRATIO 1.1 (L) 02/15/2023   BILITOT <0.2 07/08/2024   ALKPHOS 189 (H) 07/08/2024   AST 12 07/08/2024   ALT 14 07/08/2024   ANIONGAP 13 12/05/2023      The 10-year ASCVD risk score (Arnett DK, et al., 2019) is: 3.6%    Assessment & Plan:   Problem List Items Addressed This Visit   None Visit Diagnoses       Morbid obesity (HCC)    -  Primary     Macromastia         Cervicalgia         Rash           Instructed to get mammogram done as it is an important part of her health screening.  I will not proceed with reduction mammoplasty without mammogram.  Patient expressed understanding and agrees with the plan. Also instructed to make a follow-up appointment with healthy weight and wellness, as she has been gaining weight, not losing it.  Her BMI needs to be below 40 for me to proceed with surgery.  Patient was allowed to ask questions via translator.  All questions were answered to patient satisfaction.  Follow-up with me in 3 months.   Terryon Pineiro M Clarrissa Shimkus, MD  "

## 2024-10-13 ENCOUNTER — Other Ambulatory Visit: Payer: Self-pay | Admitting: Family Medicine

## 2024-10-13 ENCOUNTER — Telehealth: Payer: Self-pay | Admitting: Nurse Practitioner

## 2024-10-13 NOTE — Telephone Encounter (Signed)
 Due to inclement weather, our office will be closed tomorrow 10/14/2024 . Well reach out to reschedule your appointment. Thank you.Please call [phone] ( if the pt calls back please resch appt )

## 2024-10-14 ENCOUNTER — Ambulatory Visit: Admitting: Nurse Practitioner

## 2024-11-03 ENCOUNTER — Ambulatory Visit: Admitting: Nurse Practitioner

## 2024-11-17 ENCOUNTER — Ambulatory Visit: Payer: Self-pay | Admitting: Dermatology

## 2025-01-02 ENCOUNTER — Ambulatory Visit
